# Patient Record
Sex: Female | Born: 1935 | Race: Black or African American | Hispanic: No | Smoking: Former smoker
Health system: Southern US, Community
[De-identification: ages and names within clinical notes are randomized; demographics above are authoritative.]

## PROBLEM LIST (undated history)

## (undated) DIAGNOSIS — I10 Essential (primary) hypertension: Secondary | ICD-10-CM

## (undated) DIAGNOSIS — M109 Gout, unspecified: Secondary | ICD-10-CM

## (undated) DIAGNOSIS — D631 Anemia in chronic kidney disease: Secondary | ICD-10-CM

## (undated) DIAGNOSIS — M1990 Unspecified osteoarthritis, unspecified site: Secondary | ICD-10-CM

## (undated) DIAGNOSIS — F1010 Alcohol abuse, uncomplicated: Secondary | ICD-10-CM

## (undated) DIAGNOSIS — N189 Chronic kidney disease, unspecified: Secondary | ICD-10-CM

## (undated) HISTORY — DX: Chronic kidney disease, unspecified: N18.9

## (undated) HISTORY — PX: ABDOMINAL HYSTERECTOMY: SHX81

## (undated) HISTORY — DX: Anemia in chronic kidney disease: D63.1

---

## 2004-12-03 ENCOUNTER — Ambulatory Visit: Payer: Self-pay | Admitting: Internal Medicine

## 2004-12-29 ENCOUNTER — Emergency Department: Payer: Self-pay | Admitting: Emergency Medicine

## 2005-07-03 ENCOUNTER — Ambulatory Visit: Payer: Self-pay | Admitting: *Deleted

## 2005-12-28 ENCOUNTER — Emergency Department: Payer: Self-pay | Admitting: Emergency Medicine

## 2008-11-23 ENCOUNTER — Ambulatory Visit: Payer: Self-pay | Admitting: Family Medicine

## 2009-03-31 ENCOUNTER — Ambulatory Visit: Payer: Self-pay | Admitting: Internal Medicine

## 2009-04-12 ENCOUNTER — Ambulatory Visit: Payer: Self-pay | Admitting: Family Medicine

## 2009-04-20 ENCOUNTER — Ambulatory Visit: Payer: Self-pay | Admitting: Internal Medicine

## 2009-05-01 ENCOUNTER — Ambulatory Visit: Payer: Self-pay | Admitting: Internal Medicine

## 2009-05-19 ENCOUNTER — Inpatient Hospital Stay: Payer: Self-pay | Admitting: Psychiatry

## 2009-05-23 ENCOUNTER — Inpatient Hospital Stay: Payer: Self-pay | Admitting: Internal Medicine

## 2009-05-31 ENCOUNTER — Ambulatory Visit: Payer: Self-pay | Admitting: Internal Medicine

## 2009-06-06 ENCOUNTER — Ambulatory Visit: Payer: Self-pay | Admitting: Internal Medicine

## 2009-07-01 ENCOUNTER — Ambulatory Visit: Payer: Self-pay | Admitting: Internal Medicine

## 2009-08-01 ENCOUNTER — Ambulatory Visit: Payer: Self-pay | Admitting: Internal Medicine

## 2009-08-29 ENCOUNTER — Ambulatory Visit: Payer: Self-pay | Admitting: Internal Medicine

## 2009-09-11 ENCOUNTER — Ambulatory Visit: Payer: Self-pay | Admitting: Internal Medicine

## 2009-09-29 ENCOUNTER — Ambulatory Visit: Payer: Self-pay | Admitting: Internal Medicine

## 2011-07-01 ENCOUNTER — Inpatient Hospital Stay: Payer: Self-pay | Admitting: *Deleted

## 2011-07-02 LAB — BASIC METABOLIC PANEL
Anion Gap: 17 — ABNORMAL HIGH (ref 7–16)
Co2: 21 mmol/L (ref 21–32)
Creatinine: 1.73 mg/dL — ABNORMAL HIGH (ref 0.60–1.30)
EGFR (African American): 37 — ABNORMAL LOW
EGFR (Non-African Amer.): 31 — ABNORMAL LOW
Glucose: 96 mg/dL (ref 65–99)
Osmolality: 291 (ref 275–301)
Sodium: 144 mmol/L (ref 136–145)

## 2011-07-02 LAB — HEPATIC FUNCTION PANEL A (ARMC)
Albumin: 1.9 g/dL — ABNORMAL LOW (ref 3.4–5.0)
Alkaline Phosphatase: 37 U/L — ABNORMAL LOW (ref 50–136)
Bilirubin, Direct: 0.1 mg/dL (ref 0.00–0.20)

## 2011-07-02 LAB — URINALYSIS, COMPLETE
Ph: 6 (ref 4.5–8.0)
Protein: 30
RBC,UR: 22 /HPF (ref 0–5)
Specific Gravity: 1.009 (ref 1.003–1.030)
Squamous Epithelial: 1

## 2011-07-02 LAB — CBC WITH DIFFERENTIAL/PLATELET
Basophil #: 0 10*3/uL (ref 0.0–0.1)
Basophil %: 0 %
Eosinophil %: 1.3 %
HGB: 9.8 g/dL — ABNORMAL LOW (ref 12.0–16.0)
Monocyte #: 0.6 10*3/uL (ref 0.0–0.7)
Monocyte %: 9.9 %
Neutrophil %: 75 %
Platelet: 194 10*3/uL (ref 150–440)
RBC: 3.21 10*6/uL — ABNORMAL LOW (ref 3.80–5.20)
RDW: 19.9 % — ABNORMAL HIGH (ref 11.5–14.5)
WBC: 6.1 10*3/uL (ref 3.6–11.0)

## 2011-07-02 LAB — PROTIME-INR
INR: 1.2
Prothrombin Time: 15.1 secs — ABNORMAL HIGH (ref 11.5–14.7)

## 2011-07-02 LAB — LIPASE, BLOOD: Lipase: 72 U/L — ABNORMAL LOW (ref 73–393)

## 2011-07-03 LAB — PROTEIN / CREATININE RATIO, URINE
Creatinine, Urine: 119.2 mg/dL (ref 30.0–125.0)
Protein, Random Urine: 86 mg/dL — ABNORMAL HIGH (ref 0–12)
Protein/Creat. Ratio: 721 mg/gCREAT — ABNORMAL HIGH (ref 0–200)

## 2011-07-03 LAB — BASIC METABOLIC PANEL
BUN: 21 mg/dL — ABNORMAL HIGH (ref 7–18)
Chloride: 108 mmol/L — ABNORMAL HIGH (ref 98–107)
Creatinine: 1.73 mg/dL — ABNORMAL HIGH (ref 0.60–1.30)
EGFR (Non-African Amer.): 31 — ABNORMAL LOW
Potassium: 4.1 mmol/L (ref 3.5–5.1)
Sodium: 142 mmol/L (ref 136–145)

## 2011-07-03 LAB — URINALYSIS, COMPLETE
Hyaline Cast: 8
Ketone: NEGATIVE
Nitrite: NEGATIVE
Ph: 5 (ref 4.5–8.0)
Protein: 30
RBC,UR: 112 /HPF (ref 0–5)

## 2011-07-03 LAB — HEMOGLOBIN: HGB: 8.9 g/dL — ABNORMAL LOW (ref 12.0–16.0)

## 2011-07-04 LAB — PHOSPHORUS: Phosphorus: 1.5 mg/dL — ABNORMAL LOW (ref 2.5–4.9)

## 2011-07-04 LAB — BASIC METABOLIC PANEL
Anion Gap: 12 (ref 7–16)
BUN: 14 mg/dL (ref 7–18)
Chloride: 110 mmol/L — ABNORMAL HIGH (ref 98–107)
Co2: 17 mmol/L — ABNORMAL LOW (ref 21–32)
Creatinine: 1.62 mg/dL — ABNORMAL HIGH (ref 0.60–1.30)
EGFR (African American): 40 — ABNORMAL LOW
Glucose: 96 mg/dL (ref 65–99)
Osmolality: 278 (ref 275–301)
Potassium: 4.6 mmol/L (ref 3.5–5.1)

## 2011-07-04 LAB — URIC ACID: Uric Acid: 10.2 mg/dL — ABNORMAL HIGH (ref 2.6–6.0)

## 2011-07-05 LAB — URINE CULTURE

## 2011-07-05 LAB — PHOSPHORUS: Phosphorus: 2.1 mg/dL — ABNORMAL LOW (ref 2.5–4.9)

## 2011-07-06 LAB — BASIC METABOLIC PANEL
Anion Gap: 10 (ref 7–16)
BUN: 10 mg/dL (ref 7–18)
Calcium, Total: 6.8 mg/dL — CL (ref 8.5–10.1)
Co2: 22 mmol/L (ref 21–32)
EGFR (Non-African Amer.): 39 — ABNORMAL LOW
Glucose: 93 mg/dL (ref 65–99)
Osmolality: 282 (ref 275–301)
Potassium: 4.1 mmol/L (ref 3.5–5.1)

## 2011-07-06 LAB — PROTEIN / CREATININE RATIO, URINE
Creatinine, Urine: 104.3 mg/dL (ref 30.0–125.0)
Protein, Random Urine: 78 mg/dL — ABNORMAL HIGH (ref 0–12)
Protein/Creat. Ratio: 748 mg/gCREAT — ABNORMAL HIGH (ref 0–200)

## 2011-07-06 LAB — CBC WITH DIFFERENTIAL/PLATELET
Basophil %: 0.1 %
Eosinophil #: 0.4 10*3/uL (ref 0.0–0.7)
Eosinophil %: 5 %
HGB: 9.1 g/dL — ABNORMAL LOW (ref 12.0–16.0)
Lymphocyte #: 1.3 10*3/uL (ref 1.0–3.6)
MCH: 30 pg (ref 26.0–34.0)
MCHC: 32.7 g/dL (ref 32.0–36.0)
MCV: 92 fL (ref 80–100)
Monocyte #: 0.8 10*3/uL — ABNORMAL HIGH (ref 0.0–0.7)
Neutrophil #: 4.8 10*3/uL (ref 1.4–6.5)
Neutrophil %: 66.8 %
RBC: 3.03 10*6/uL — ABNORMAL LOW (ref 3.80–5.20)

## 2011-07-07 LAB — BASIC METABOLIC PANEL
Anion Gap: 8 (ref 7–16)
Calcium, Total: 7 mg/dL — CL (ref 8.5–10.1)
Chloride: 106 mmol/L (ref 98–107)
Co2: 22 mmol/L (ref 21–32)
EGFR (African American): 48 — ABNORMAL LOW
EGFR (Non-African Amer.): 40 — ABNORMAL LOW
Osmolality: 270 (ref 275–301)
Potassium: 3.7 mmol/L (ref 3.5–5.1)

## 2011-07-08 LAB — BASIC METABOLIC PANEL
BUN: 10 mg/dL (ref 7–18)
Calcium, Total: 7.1 mg/dL — ABNORMAL LOW (ref 8.5–10.1)
Chloride: 106 mmol/L (ref 98–107)
Creatinine: 1.32 mg/dL — ABNORMAL HIGH (ref 0.60–1.30)
EGFR (African American): 51 — ABNORMAL LOW
EGFR (Non-African Amer.): 42 — ABNORMAL LOW
Potassium: 4 mmol/L (ref 3.5–5.1)

## 2011-07-08 LAB — PROTEIN ELECTROPHORESIS(ARMC)

## 2011-07-08 LAB — HEMOGLOBIN: HGB: 9.4 g/dL — ABNORMAL LOW (ref 12.0–16.0)

## 2011-09-12 ENCOUNTER — Inpatient Hospital Stay: Payer: Self-pay | Admitting: Specialist

## 2011-09-12 LAB — COMPREHENSIVE METABOLIC PANEL
Albumin: 2.4 g/dL — ABNORMAL LOW (ref 3.4–5.0)
BUN: 18 mg/dL (ref 7–18)
Bilirubin,Total: 0.3 mg/dL (ref 0.2–1.0)
Calcium, Total: 7.5 mg/dL — ABNORMAL LOW (ref 8.5–10.1)
EGFR (African American): 39 — ABNORMAL LOW
Glucose: 77 mg/dL (ref 65–99)
SGOT(AST): 40 U/L — ABNORMAL HIGH (ref 15–37)
SGPT (ALT): 22 U/L
Sodium: 142 mmol/L (ref 136–145)
Total Protein: 6.9 g/dL (ref 6.4–8.2)

## 2011-09-12 LAB — CBC
HCT: 21.1 % — ABNORMAL LOW (ref 35.0–47.0)
MCH: 32.2 pg (ref 26.0–34.0)
MCV: 96 fL (ref 80–100)
RBC: 2.2 10*6/uL — ABNORMAL LOW (ref 3.80–5.20)
RDW: 20.7 % — ABNORMAL HIGH (ref 11.5–14.5)
WBC: 7.6 10*3/uL (ref 3.6–11.0)

## 2011-09-13 LAB — CBC WITH DIFFERENTIAL/PLATELET
Basophil #: 0 10*3/uL (ref 0.0–0.1)
Basophil %: 0.2 %
Eosinophil #: 0.3 10*3/uL (ref 0.0–0.7)
Eosinophil %: 3.9 %
Lymphocyte #: 1.8 10*3/uL (ref 1.0–3.6)
Lymphocyte %: 23.3 %
MCHC: 33.2 g/dL (ref 32.0–36.0)
Monocyte #: 0.8 10*3/uL — ABNORMAL HIGH (ref 0.0–0.7)
Neutrophil %: 62.5 %
Platelet: 183 10*3/uL (ref 150–440)
RDW: 20.3 % — ABNORMAL HIGH (ref 11.5–14.5)
WBC: 7.9 10*3/uL (ref 3.6–11.0)

## 2011-09-13 LAB — BASIC METABOLIC PANEL
Chloride: 104 mmol/L (ref 98–107)
Co2: 26 mmol/L (ref 21–32)
EGFR (Non-African Amer.): 31 — ABNORMAL LOW
Osmolality: 288 (ref 275–301)
Potassium: 3.7 mmol/L (ref 3.5–5.1)

## 2011-09-14 LAB — CBC WITH DIFFERENTIAL/PLATELET
Basophil #: 0 10*3/uL (ref 0.0–0.1)
Basophil %: 0.1 %
Eosinophil %: 2.8 %
Lymphocyte #: 2.4 10*3/uL (ref 1.0–3.6)
Lymphocyte %: 28.2 %
MCV: 93 fL (ref 80–100)
Monocyte %: 11 %
Platelet: 192 10*3/uL (ref 150–440)
RBC: 3.46 10*6/uL — ABNORMAL LOW (ref 3.80–5.20)
WBC: 8.6 10*3/uL (ref 3.6–11.0)

## 2011-09-14 LAB — BASIC METABOLIC PANEL
Anion Gap: 13 (ref 7–16)
Calcium, Total: 7.4 mg/dL — ABNORMAL LOW (ref 8.5–10.1)
Co2: 24 mmol/L (ref 21–32)
EGFR (African American): 36 — ABNORMAL LOW
EGFR (Non-African Amer.): 30 — ABNORMAL LOW
Glucose: 78 mg/dL (ref 65–99)
Osmolality: 278 (ref 275–301)
Potassium: 4 mmol/L (ref 3.5–5.1)
Sodium: 139 mmol/L (ref 136–145)

## 2012-03-03 ENCOUNTER — Inpatient Hospital Stay: Payer: Self-pay | Admitting: Internal Medicine

## 2012-03-03 LAB — BASIC METABOLIC PANEL
BUN: 26 mg/dL — ABNORMAL HIGH (ref 7–18)
Calcium, Total: 8 mg/dL — ABNORMAL LOW (ref 8.5–10.1)
Chloride: 97 mmol/L — ABNORMAL LOW (ref 98–107)
Co2: 14 mmol/L — ABNORMAL LOW (ref 21–32)
Creatinine: 2.01 mg/dL — ABNORMAL HIGH (ref 0.60–1.30)
EGFR (African American): 27 — ABNORMAL LOW
Potassium: 3.9 mmol/L (ref 3.5–5.1)
Sodium: 133 mmol/L — ABNORMAL LOW (ref 136–145)

## 2012-03-03 LAB — CBC WITH DIFFERENTIAL/PLATELET
Basophil #: 0 10*3/uL (ref 0.0–0.1)
HCT: 32.1 % — ABNORMAL LOW (ref 35.0–47.0)
Lymphocyte #: 0.4 10*3/uL — ABNORMAL LOW (ref 1.0–3.6)
MCH: 31.7 pg (ref 26.0–34.0)
MCHC: 32.4 g/dL (ref 32.0–36.0)
MCV: 98 fL (ref 80–100)
Monocyte #: 0.1 x10 3/mm — ABNORMAL LOW (ref 0.2–0.9)
Monocyte %: 2 %
Neutrophil #: 6.2 10*3/uL (ref 1.4–6.5)
Platelet: 201 10*3/uL (ref 150–440)
RDW: 16.2 % — ABNORMAL HIGH (ref 11.5–14.5)
WBC: 6.8 10*3/uL (ref 3.6–11.0)

## 2012-03-03 LAB — PHOSPHORUS: Phosphorus: 5.7 mg/dL — ABNORMAL HIGH (ref 2.5–4.9)

## 2012-03-03 LAB — DRUG SCREEN, URINE
Barbiturates, Ur Screen: NEGATIVE (ref ?–200)
Cannabinoid 50 Ng, Ur ~~LOC~~: NEGATIVE (ref ?–50)
Cocaine Metabolite,Ur ~~LOC~~: NEGATIVE (ref ?–300)
Methadone, Ur Screen: NEGATIVE (ref ?–300)
Opiate, Ur Screen: NEGATIVE (ref ?–300)
Phencyclidine (PCP) Ur S: NEGATIVE (ref ?–25)
Tricyclic, Ur Screen: NEGATIVE (ref ?–1000)

## 2012-03-03 LAB — COMPREHENSIVE METABOLIC PANEL
Albumin: 3.3 g/dL — ABNORMAL LOW (ref 3.4–5.0)
Alkaline Phosphatase: 75 U/L (ref 50–136)
Anion Gap: 25 — ABNORMAL HIGH (ref 7–16)
BUN: 26 mg/dL — ABNORMAL HIGH (ref 7–18)
Calcium, Total: 8.5 mg/dL (ref 8.5–10.1)
Creatinine: 2.03 mg/dL — ABNORMAL HIGH (ref 0.60–1.30)
EGFR (Non-African Amer.): 23 — ABNORMAL LOW
Glucose: 35 mg/dL — CL (ref 65–99)
Osmolality: 273 (ref 275–301)
Potassium: 4 mmol/L (ref 3.5–5.1)
SGOT(AST): 36 U/L (ref 15–37)
SGPT (ALT): 13 U/L (ref 12–78)
Sodium: 136 mmol/L (ref 136–145)
Total Protein: 8.3 g/dL — ABNORMAL HIGH (ref 6.4–8.2)

## 2012-03-03 LAB — URINALYSIS, COMPLETE
Bilirubin,UR: NEGATIVE
Glucose,UR: NEGATIVE mg/dL (ref 0–75)
Hyaline Cast: 7
Nitrite: NEGATIVE
Ph: 5 (ref 4.5–8.0)
Protein: 100
RBC,UR: 23 /HPF (ref 0–5)
Specific Gravity: 1.01 (ref 1.003–1.030)
Squamous Epithelial: 2
WBC UR: 472 /HPF (ref 0–5)

## 2012-03-03 LAB — ETHANOL: Ethanol %: 0.105 % — ABNORMAL HIGH (ref 0.000–0.080)

## 2012-03-03 LAB — TSH: Thyroid Stimulating Horm: 1.66 u[IU]/mL

## 2012-03-03 LAB — LIPASE, BLOOD: Lipase: 390 U/L (ref 73–393)

## 2012-03-04 LAB — BASIC METABOLIC PANEL
BUN: 26 mg/dL — ABNORMAL HIGH (ref 7–18)
Calcium, Total: 7.5 mg/dL — ABNORMAL LOW (ref 8.5–10.1)
Chloride: 100 mmol/L (ref 98–107)
Co2: 23 mmol/L (ref 21–32)
Osmolality: 272 (ref 275–301)
Potassium: 3.7 mmol/L (ref 3.5–5.1)
Sodium: 134 mmol/L — ABNORMAL LOW (ref 136–145)

## 2012-03-04 LAB — PHOSPHORUS: Phosphorus: 3 mg/dL (ref 2.5–4.9)

## 2012-03-05 LAB — BASIC METABOLIC PANEL
BUN: 22 mg/dL — ABNORMAL HIGH (ref 7–18)
Calcium, Total: 7.1 mg/dL — ABNORMAL LOW (ref 8.5–10.1)
Chloride: 106 mmol/L (ref 98–107)
Creatinine: 1.82 mg/dL — ABNORMAL HIGH (ref 0.60–1.30)
EGFR (Non-African Amer.): 27 — ABNORMAL LOW
Glucose: 95 mg/dL (ref 65–99)
Osmolality: 277 (ref 275–301)
Potassium: 3.2 mmol/L — ABNORMAL LOW (ref 3.5–5.1)
Sodium: 137 mmol/L (ref 136–145)

## 2012-03-05 LAB — CBC WITH DIFFERENTIAL/PLATELET
Basophil #: 0 10*3/uL (ref 0.0–0.1)
Basophil %: 0.5 %
Eosinophil #: 0.2 10*3/uL (ref 0.0–0.7)
HCT: 22.8 % — ABNORMAL LOW (ref 35.0–47.0)
Lymphocyte #: 2.3 10*3/uL (ref 1.0–3.6)
Lymphocyte %: 40.8 %
MCH: 31.9 pg (ref 26.0–34.0)
MCHC: 33.6 g/dL (ref 32.0–36.0)
Neutrophil #: 2.5 10*3/uL (ref 1.4–6.5)
Neutrophil %: 46 %
Platelet: 134 10*3/uL — ABNORMAL LOW (ref 150–440)
RDW: 16 % — ABNORMAL HIGH (ref 11.5–14.5)
WBC: 5.5 10*3/uL (ref 3.6–11.0)

## 2012-03-06 LAB — CBC WITH DIFFERENTIAL/PLATELET
HCT: 24.2 % — ABNORMAL LOW (ref 35.0–47.0)
Lymphocyte #: 1.1 10*3/uL (ref 1.0–3.6)
MCH: 31.2 pg (ref 26.0–34.0)
MCHC: 32.8 g/dL (ref 32.0–36.0)
MCV: 95 fL (ref 80–100)
Monocyte #: 0.5 x10 3/mm (ref 0.2–0.9)
Monocyte %: 10.5 %
Neutrophil #: 2.7 10*3/uL (ref 1.4–6.5)
Neutrophil %: 59.1 %
Platelet: 131 10*3/uL — ABNORMAL LOW (ref 150–440)
RDW: 16 % — ABNORMAL HIGH (ref 11.5–14.5)
WBC: 4.6 10*3/uL (ref 3.6–11.0)

## 2012-03-06 LAB — BASIC METABOLIC PANEL
Anion Gap: 7 (ref 7–16)
BUN: 11 mg/dL (ref 7–18)
Calcium, Total: 7.2 mg/dL — ABNORMAL LOW (ref 8.5–10.1)
Chloride: 110 mmol/L — ABNORMAL HIGH (ref 98–107)
EGFR (Non-African Amer.): 32 — ABNORMAL LOW
Glucose: 96 mg/dL (ref 65–99)
Potassium: 3.7 mmol/L (ref 3.5–5.1)
Sodium: 141 mmol/L (ref 136–145)

## 2012-05-16 ENCOUNTER — Other Ambulatory Visit: Payer: Self-pay | Admitting: Family Medicine

## 2012-05-16 LAB — URINALYSIS, COMPLETE
Bilirubin,UR: NEGATIVE
Glucose,UR: NEGATIVE mg/dL (ref 0–75)
Nitrite: NEGATIVE
Protein: 100
RBC,UR: 24 /HPF (ref 0–5)
Specific Gravity: 1.011 (ref 1.003–1.030)
Squamous Epithelial: 2

## 2012-07-09 ENCOUNTER — Emergency Department: Payer: Self-pay | Admitting: Emergency Medicine

## 2012-07-09 LAB — CBC WITH DIFFERENTIAL/PLATELET
Basophil #: 0.1 10*3/uL (ref 0.0–0.1)
Basophil %: 1.3 %
Eosinophil %: 3 %
HGB: 8.7 g/dL — ABNORMAL LOW (ref 12.0–16.0)
Lymphocyte %: 37.7 %
MCHC: 33.5 g/dL (ref 32.0–36.0)
MCV: 93 fL (ref 80–100)
Monocyte %: 8.9 %
Neutrophil #: 2.3 10*3/uL (ref 1.4–6.5)
Platelet: 156 10*3/uL (ref 150–440)
RDW: 15.8 % — ABNORMAL HIGH (ref 11.5–14.5)
WBC: 4.6 10*3/uL (ref 3.6–11.0)

## 2012-07-09 LAB — COMPREHENSIVE METABOLIC PANEL
Anion Gap: 10 (ref 7–16)
Calcium, Total: 7.7 mg/dL — ABNORMAL LOW (ref 8.5–10.1)
Creatinine: 1.22 mg/dL (ref 0.60–1.30)
EGFR (African American): 50 — ABNORMAL LOW
EGFR (Non-African Amer.): 43 — ABNORMAL LOW
Sodium: 142 mmol/L (ref 136–145)
Total Protein: 6.8 g/dL (ref 6.4–8.2)

## 2012-07-09 LAB — ETHANOL
Ethanol %: 0.238 % — ABNORMAL HIGH (ref 0.000–0.080)
Ethanol: 238 mg/dL

## 2012-07-09 LAB — CK TOTAL AND CKMB (NOT AT ARMC): CK-MB: <0.5 ng/mL — ABNORMAL LOW (ref 0.5–3.6)

## 2012-09-09 ENCOUNTER — Emergency Department: Payer: Self-pay | Admitting: Emergency Medicine

## 2012-09-09 LAB — COMPREHENSIVE METABOLIC PANEL
Anion Gap: 17 — ABNORMAL HIGH (ref 7–16)
BUN: 10 mg/dL (ref 7–18)
Calcium, Total: 8.1 mg/dL — ABNORMAL LOW (ref 8.5–10.1)
Chloride: 103 mmol/L (ref 98–107)
Co2: 18 mmol/L — ABNORMAL LOW (ref 21–32)
Creatinine: 1.39 mg/dL — ABNORMAL HIGH (ref 0.60–1.30)
EGFR (African American): 43 — ABNORMAL LOW
Potassium: 4.1 mmol/L (ref 3.5–5.1)
SGPT (ALT): 35 U/L (ref 12–78)
Total Protein: 6.6 g/dL (ref 6.4–8.2)

## 2012-09-09 LAB — URINALYSIS, COMPLETE
Bilirubin,UR: NEGATIVE
Nitrite: NEGATIVE
Ph: 5 (ref 4.5–8.0)
Protein: NEGATIVE
RBC,UR: 2 /HPF (ref 0–5)
Squamous Epithelial: 2

## 2012-09-09 LAB — ETHANOL
Ethanol %: 0.059 % (ref 0.000–0.080)
Ethanol: 59 mg/dL

## 2012-09-09 LAB — CBC
MCV: 97 fL (ref 80–100)
WBC: 5.9 10*3/uL (ref 3.6–11.0)

## 2012-09-29 ENCOUNTER — Inpatient Hospital Stay: Payer: Self-pay | Admitting: Internal Medicine

## 2012-09-29 LAB — COMPREHENSIVE METABOLIC PANEL
BUN: 15 mg/dL (ref 7–18)
Bilirubin,Total: 1 mg/dL (ref 0.2–1.0)
Creatinine: 2.12 mg/dL — ABNORMAL HIGH (ref 0.60–1.30)
EGFR (Non-African Amer.): 22 — ABNORMAL LOW
Glucose: 112 mg/dL — ABNORMAL HIGH (ref 65–99)
SGOT(AST): 98 U/L — ABNORMAL HIGH (ref 15–37)
Sodium: 139 mmol/L (ref 136–145)
Total Protein: 6.6 g/dL (ref 6.4–8.2)

## 2012-09-29 LAB — URINALYSIS, COMPLETE
Nitrite: NEGATIVE
Protein: 30
Squamous Epithelial: 1
WBC UR: 41 /HPF (ref 0–5)

## 2012-09-29 LAB — CBC
HCT: 21.7 % — ABNORMAL LOW (ref 35.0–47.0)
HGB: 6.8 g/dL — ABNORMAL LOW (ref 12.0–16.0)
MCH: 31.7 pg (ref 26.0–34.0)
MCHC: 31.2 g/dL — ABNORMAL LOW (ref 32.0–36.0)
MCV: 102 fL — ABNORMAL HIGH (ref 80–100)
Platelet: 164 10*3/uL (ref 150–440)
RDW: 18.2 % — ABNORMAL HIGH (ref 11.5–14.5)
WBC: 4.8 10*3/uL (ref 3.6–11.0)

## 2012-09-29 LAB — DRUG SCREEN, URINE
Amphetamines, Ur Screen: NEGATIVE (ref ?–1000)
Barbiturates, Ur Screen: NEGATIVE (ref ?–200)
Benzodiazepine, Ur Scrn: NEGATIVE (ref ?–200)
Cannabinoid 50 Ng, Ur ~~LOC~~: NEGATIVE (ref ?–50)
Cocaine Metabolite,Ur ~~LOC~~: NEGATIVE (ref ?–300)
Opiate, Ur Screen: NEGATIVE (ref ?–300)

## 2012-09-29 LAB — TSH: Thyroid Stimulating Horm: 5.9 u[IU]/mL — ABNORMAL HIGH

## 2012-09-29 LAB — ETHANOL: Ethanol: <3 mg/dL

## 2012-09-30 LAB — CBC WITH DIFFERENTIAL/PLATELET
Basophil #: 0.1 10*3/uL (ref 0.0–0.1)
Eosinophil %: 2.7 %
HCT: 26.5 % — ABNORMAL LOW (ref 35.0–47.0)
HGB: 8.5 g/dL — ABNORMAL LOW (ref 12.0–16.0)
Lymphocyte #: 1.5 10*3/uL (ref 1.0–3.6)
MCH: 30.4 pg (ref 26.0–34.0)
MCHC: 32.1 g/dL (ref 32.0–36.0)
MCV: 95 fL (ref 80–100)
Monocyte #: 0.4 x10 3/mm (ref 0.2–0.9)
Monocyte %: 6.9 %
Neutrophil %: 60.9 %
Platelet: 160 10*3/uL (ref 150–440)
WBC: 5.3 10*3/uL (ref 3.6–11.0)

## 2012-09-30 LAB — COMPREHENSIVE METABOLIC PANEL
Albumin: 1.9 g/dL — ABNORMAL LOW (ref 3.4–5.0)
Alkaline Phosphatase: 96 U/L (ref 50–136)
BUN: 14 mg/dL (ref 7–18)
Calcium, Total: 7.9 mg/dL — ABNORMAL LOW (ref 8.5–10.1)
Chloride: 105 mmol/L (ref 98–107)
Creatinine: 1.88 mg/dL — ABNORMAL HIGH (ref 0.60–1.30)
Osmolality: 278 (ref 275–301)
Potassium: 3.6 mmol/L (ref 3.5–5.1)
Sodium: 139 mmol/L (ref 136–145)
Total Protein: 6.6 g/dL (ref 6.4–8.2)

## 2012-09-30 LAB — OCCULT BLOOD X 1 CARD TO LAB, STOOL: Occult Blood, Feces: NEGATIVE

## 2012-09-30 LAB — MAGNESIUM: Magnesium: 2 mg/dL

## 2012-09-30 LAB — LIPID PANEL
Cholesterol: 120 mg/dL (ref 0–200)
Triglycerides: 172 mg/dL (ref 0–200)

## 2012-10-01 LAB — BASIC METABOLIC PANEL
Anion Gap: 8 (ref 7–16)
BUN: 13 mg/dL (ref 7–18)
Chloride: 112 mmol/L — ABNORMAL HIGH (ref 98–107)
Creatinine: 1.61 mg/dL — ABNORMAL HIGH (ref 0.60–1.30)
EGFR (Non-African Amer.): 31 — ABNORMAL LOW
Osmolality: 283 (ref 275–301)
Potassium: 3.4 mmol/L — ABNORMAL LOW (ref 3.5–5.1)
Sodium: 142 mmol/L (ref 136–145)

## 2012-10-01 LAB — CBC WITH DIFFERENTIAL/PLATELET
Basophil %: 0.9 %
Eosinophil #: 0.1 10*3/uL (ref 0.0–0.7)
Eosinophil %: 3.1 %
Lymphocyte #: 1.1 10*3/uL (ref 1.0–3.6)
MCH: 30.3 pg (ref 26.0–34.0)
MCHC: 31.9 g/dL — ABNORMAL LOW (ref 32.0–36.0)
MCV: 95 fL (ref 80–100)
Monocyte #: 0.3 x10 3/mm (ref 0.2–0.9)
Monocyte %: 7.5 %
Neutrophil #: 2.4 10*3/uL (ref 1.4–6.5)
Neutrophil %: 60.2 %
Platelet: 137 10*3/uL — ABNORMAL LOW (ref 150–440)
RBC: 2.35 10*6/uL — ABNORMAL LOW (ref 3.80–5.20)
RDW: 22.5 % — ABNORMAL HIGH (ref 11.5–14.5)

## 2012-10-01 LAB — URINE CULTURE

## 2012-10-05 LAB — CULTURE, BLOOD (SINGLE)

## 2012-11-19 ENCOUNTER — Ambulatory Visit: Payer: Self-pay | Admitting: Internal Medicine

## 2012-11-20 ENCOUNTER — Ambulatory Visit: Payer: Self-pay | Admitting: Internal Medicine

## 2012-11-20 LAB — CBC CANCER CENTER
Basophil #: 0.1 x10 3/mm (ref 0.0–0.1)
Basophil %: 1.8 %
HCT: 20.4 % — ABNORMAL LOW (ref 35.0–47.0)
Lymphocyte #: 2 x10 3/mm (ref 1.0–3.6)
Lymphocyte %: 37.2 %
MCHC: 32.4 g/dL (ref 32.0–36.0)
MCV: 90 fL (ref 80–100)
Monocyte #: 0.4 x10 3/mm (ref 0.2–0.9)
Monocyte %: 7.7 %
Neutrophil #: 2.8 x10 3/mm (ref 1.4–6.5)
Platelet: 246 x10 3/mm (ref 150–440)
RBC: 2.26 10*6/uL — ABNORMAL LOW (ref 3.80–5.20)

## 2012-11-20 LAB — COMPREHENSIVE METABOLIC PANEL
Albumin: 1.6 g/dL — ABNORMAL LOW (ref 3.4–5.0)
Alkaline Phosphatase: 91 U/L (ref 50–136)
BUN: 15 mg/dL (ref 7–18)
Bilirubin,Total: 0.3 mg/dL (ref 0.2–1.0)
Calcium, Total: 8 mg/dL — ABNORMAL LOW (ref 8.5–10.1)
Creatinine: 1.7 mg/dL — ABNORMAL HIGH (ref 0.60–1.30)
EGFR (African American): 34 — ABNORMAL LOW
EGFR (Non-African Amer.): 29 — ABNORMAL LOW
Glucose: 82 mg/dL (ref 65–99)
Osmolality: 281 (ref 275–301)
Potassium: 3.4 mmol/L — ABNORMAL LOW (ref 3.5–5.1)
SGOT(AST): 27 U/L (ref 15–37)
SGPT (ALT): 20 U/L (ref 12–78)
Sodium: 141 mmol/L (ref 136–145)
Total Protein: 6.2 g/dL — ABNORMAL LOW (ref 6.4–8.2)

## 2012-11-20 LAB — FOLATE: Folic Acid: 39.8 ng/mL (ref 3.1–100.0)

## 2012-11-20 LAB — RETICULOCYTES: Absolute Retic Count: 0.0186 10*6/uL — ABNORMAL LOW

## 2012-11-20 LAB — IRON AND TIBC
Iron Bind.Cap.(Total): 67 ug/dL — ABNORMAL LOW (ref 250–450)
Iron Saturation: 100 %
Iron: 67 ug/dL (ref 50–170)

## 2012-11-20 LAB — MAGNESIUM: Magnesium: 1.2 mg/dL — ABNORMAL LOW

## 2012-11-27 LAB — MAGNESIUM: Magnesium: 1.3 mg/dL — ABNORMAL LOW

## 2012-11-29 ENCOUNTER — Ambulatory Visit: Payer: Self-pay | Admitting: Internal Medicine

## 2012-12-01 LAB — PROT IMMUNOELECTROPHORES(ARMC)

## 2012-12-10 LAB — MAGNESIUM: Magnesium: 1.4 mg/dL — ABNORMAL LOW

## 2012-12-22 LAB — IRON AND TIBC
Iron Bind.Cap.(Total): 103 ug/dL — ABNORMAL LOW (ref 250–450)
Iron Saturation: 78 %

## 2012-12-22 LAB — HEMOGLOBIN: HGB: 10 g/dL — ABNORMAL LOW (ref 12.0–16.0)

## 2012-12-29 ENCOUNTER — Ambulatory Visit: Payer: Self-pay | Admitting: Internal Medicine

## 2013-01-29 ENCOUNTER — Ambulatory Visit: Payer: Self-pay | Admitting: Internal Medicine

## 2013-02-01 LAB — POTASSIUM: Potassium: 3.4 mmol/L — ABNORMAL LOW (ref 3.5–5.1)

## 2013-02-01 LAB — IRON AND TIBC: Iron: 42 ug/dL — ABNORMAL LOW (ref 50–170)

## 2013-02-15 LAB — CANCER CENTER HEMOGLOBIN: HGB: 9.4 g/dL — ABNORMAL LOW (ref 12.0–16.0)

## 2013-03-01 ENCOUNTER — Ambulatory Visit: Payer: Self-pay | Admitting: Internal Medicine

## 2013-03-29 LAB — CANCER CENTER HEMOGLOBIN: HGB: 10.8 g/dL — ABNORMAL LOW (ref 12.0–16.0)

## 2013-03-31 ENCOUNTER — Ambulatory Visit: Payer: Self-pay | Admitting: Internal Medicine

## 2013-05-05 ENCOUNTER — Ambulatory Visit: Payer: Self-pay | Admitting: Internal Medicine

## 2013-05-05 LAB — CANCER CENTER HEMOGLOBIN: HGB: 10.9 g/dL — ABNORMAL LOW (ref 12.0–16.0)

## 2013-05-31 ENCOUNTER — Ambulatory Visit: Payer: Self-pay | Admitting: Internal Medicine

## 2013-10-06 ENCOUNTER — Emergency Department: Payer: Self-pay | Admitting: Emergency Medicine

## 2013-12-20 ENCOUNTER — Emergency Department: Payer: Self-pay | Admitting: Emergency Medicine

## 2013-12-20 LAB — BASIC METABOLIC PANEL
Anion Gap: 11 (ref 7–16)
BUN: 26 mg/dL — AB (ref 7–18)
CREATININE: 1.79 mg/dL — AB (ref 0.60–1.30)
Calcium, Total: 9.2 mg/dL (ref 8.5–10.1)
Chloride: 99 mmol/L (ref 98–107)
Co2: 24 mmol/L (ref 21–32)
EGFR (African American): 31 — ABNORMAL LOW
EGFR (Non-African Amer.): 27 — ABNORMAL LOW
Glucose: 127 mg/dL — ABNORMAL HIGH (ref 65–99)
Osmolality: 275 (ref 275–301)
POTASSIUM: 3.8 mmol/L (ref 3.5–5.1)
Sodium: 134 mmol/L — ABNORMAL LOW (ref 136–145)

## 2013-12-20 LAB — CBC WITH DIFFERENTIAL/PLATELET
Basophil #: 0.1 10*3/uL (ref 0.0–0.1)
Basophil %: 1.1 %
Eosinophil #: 0.5 10*3/uL (ref 0.0–0.7)
Eosinophil %: 8.2 %
HCT: 31.1 % — ABNORMAL LOW (ref 35.0–47.0)
HGB: 9.9 g/dL — ABNORMAL LOW (ref 12.0–16.0)
LYMPHS ABS: 2.1 10*3/uL (ref 1.0–3.6)
Lymphocyte %: 34.4 %
MCH: 31.8 pg (ref 26.0–34.0)
MCHC: 31.7 g/dL — AB (ref 32.0–36.0)
MCV: 100 fL (ref 80–100)
MONO ABS: 0.6 x10 3/mm (ref 0.2–0.9)
MONOS PCT: 10.7 %
Neutrophil #: 2.8 10*3/uL (ref 1.4–6.5)
Neutrophil %: 45.6 %
Platelet: 246 10*3/uL (ref 150–440)
RBC: 3.11 10*6/uL — ABNORMAL LOW (ref 3.80–5.20)
RDW: 17.9 % — AB (ref 11.5–14.5)
WBC: 6.1 10*3/uL (ref 3.6–11.0)

## 2014-04-07 ENCOUNTER — Emergency Department: Payer: Self-pay | Admitting: Emergency Medicine

## 2014-04-07 LAB — CBC WITH DIFFERENTIAL/PLATELET
BASOS PCT: 0.9 %
Basophil #: 0.1 10*3/uL (ref 0.0–0.1)
Eosinophil #: 0 10*3/uL (ref 0.0–0.7)
Eosinophil %: 0.5 %
HCT: 31.1 % — AB (ref 35.0–47.0)
HGB: 10 g/dL — ABNORMAL LOW (ref 12.0–16.0)
LYMPHS PCT: 11.4 %
Lymphocyte #: 0.8 10*3/uL — ABNORMAL LOW (ref 1.0–3.6)
MCH: 31.4 pg (ref 26.0–34.0)
MCHC: 32.3 g/dL (ref 32.0–36.0)
MCV: 97 fL (ref 80–100)
MONOS PCT: 5.1 %
Monocyte #: 0.3 x10 3/mm (ref 0.2–0.9)
NEUTROS ABS: 5.6 10*3/uL (ref 1.4–6.5)
Neutrophil %: 82.1 %
PLATELETS: 224 10*3/uL (ref 150–440)
RBC: 3.2 10*6/uL — AB (ref 3.80–5.20)
RDW: 17.9 % — ABNORMAL HIGH (ref 11.5–14.5)
WBC: 6.8 10*3/uL (ref 3.6–11.0)

## 2014-04-07 LAB — URINALYSIS, COMPLETE
Bilirubin,UR: NEGATIVE
Glucose,UR: NEGATIVE mg/dL (ref 0–75)
Nitrite: NEGATIVE
Ph: 7 (ref 4.5–8.0)
RBC,UR: 14 /HPF (ref 0–5)
SPECIFIC GRAVITY: 1.013 (ref 1.003–1.030)

## 2014-04-07 LAB — PROTIME-INR
INR: 1
Prothrombin Time: 12.9 secs (ref 11.5–14.7)

## 2014-04-07 LAB — COMPREHENSIVE METABOLIC PANEL
ALBUMIN: 2.9 g/dL — AB (ref 3.4–5.0)
ALK PHOS: 74 U/L
ALT: 29 U/L
ANION GAP: 10 (ref 7–16)
AST: 52 U/L — AB (ref 15–37)
BUN: 24 mg/dL — ABNORMAL HIGH (ref 7–18)
Bilirubin,Total: 0.7 mg/dL (ref 0.2–1.0)
CALCIUM: 8.6 mg/dL (ref 8.5–10.1)
CHLORIDE: 105 mmol/L (ref 98–107)
Co2: 22 mmol/L (ref 21–32)
Creatinine: 1.72 mg/dL — ABNORMAL HIGH (ref 0.60–1.30)
EGFR (African American): 37 — ABNORMAL LOW
EGFR (Non-African Amer.): 31 — ABNORMAL LOW
Glucose: 97 mg/dL (ref 65–99)
OSMOLALITY: 278 (ref 275–301)
Potassium: 4.6 mmol/L (ref 3.5–5.1)
Sodium: 137 mmol/L (ref 136–145)
TOTAL PROTEIN: 7.6 g/dL (ref 6.4–8.2)

## 2014-04-07 LAB — LIPASE, BLOOD: Lipase: 122 U/L (ref 73–393)

## 2014-04-07 LAB — CK TOTAL AND CKMB (NOT AT ARMC)
CK, TOTAL: 397 U/L — AB
CK-MB: 2 ng/mL (ref 0.5–3.6)

## 2014-04-07 LAB — TROPONIN I: Troponin-I: <0.02 ng/mL

## 2014-06-06 ENCOUNTER — Emergency Department: Payer: Self-pay | Admitting: Student

## 2014-06-06 LAB — COMPREHENSIVE METABOLIC PANEL
ALBUMIN: 2.4 g/dL — AB (ref 3.4–5.0)
ALT: 25 U/L
ANION GAP: 15 (ref 7–16)
Alkaline Phosphatase: 71 U/L
BUN: 22 mg/dL — ABNORMAL HIGH (ref 7–18)
Bilirubin,Total: 0.4 mg/dL (ref 0.2–1.0)
CHLORIDE: 104 mmol/L (ref 98–107)
CO2: 22 mmol/L (ref 21–32)
Calcium, Total: 7.8 mg/dL — ABNORMAL LOW (ref 8.5–10.1)
Creatinine: 1.38 mg/dL — ABNORMAL HIGH (ref 0.60–1.30)
EGFR (Non-African Amer.): 39 — ABNORMAL LOW
GFR CALC AF AMER: 48 — AB
Glucose: 73 mg/dL (ref 65–99)
Osmolality: 283 (ref 275–301)
Potassium: 5.1 mmol/L (ref 3.5–5.1)
SGOT(AST): 53 U/L — ABNORMAL HIGH (ref 15–37)
Sodium: 141 mmol/L (ref 136–145)
TOTAL PROTEIN: 6.9 g/dL (ref 6.4–8.2)

## 2014-06-06 LAB — URINALYSIS, COMPLETE
BILIRUBIN, UR: NEGATIVE
GLUCOSE, UR: NEGATIVE mg/dL (ref 0–75)
KETONE: NEGATIVE
Nitrite: NEGATIVE
Ph: 5 (ref 4.5–8.0)
Protein: NEGATIVE
RBC,UR: 9 /HPF (ref 0–5)
Specific Gravity: 1.009 (ref 1.003–1.030)

## 2014-06-06 LAB — CK-MB: CK-MB: 0.7 ng/mL (ref 0.5–3.6)

## 2014-06-06 LAB — CBC
HCT: 28 % — AB (ref 35.0–47.0)
HGB: 9.1 g/dL — AB (ref 12.0–16.0)
MCH: 31.7 pg (ref 26.0–34.0)
MCHC: 32.3 g/dL (ref 32.0–36.0)
MCV: 98 fL (ref 80–100)
PLATELETS: 225 10*3/uL (ref 150–440)
RBC: 2.86 10*6/uL — AB (ref 3.80–5.20)
RDW: 16.3 % — AB (ref 11.5–14.5)
WBC: 9.3 10*3/uL (ref 3.6–11.0)

## 2014-06-06 LAB — CK: CK, Total: 123 U/L (ref 26–192)

## 2014-06-06 LAB — TROPONIN I: Troponin-I: <0.02 ng/mL

## 2014-06-07 LAB — TROPONIN I: Troponin-I: <0.02 ng/mL

## 2014-06-08 LAB — URINE CULTURE

## 2014-08-09 ENCOUNTER — Emergency Department: Payer: Self-pay | Admitting: Emergency Medicine

## 2014-10-18 NOTE — Discharge Summary (Signed)
PATIENT NAME:  [name] Holmes, [name] Holmes MR#:  [id number] DATE OF BIRTH:  11/04/1934  DATE OF ADMISSION:  03/03/2012 DATE OF DISCHARGE:  03/06/2012  PRIMARY CARE PHYSICIAN: Delight Stare, M.D.   DISCHARGE DIAGNOSES:  1. Severe hypoglycemia.  2. Anion gap metabolic acidosis.  3. Acute renal failure.  4. Alcohol abuse.  5. Chronic anemia.  6. Hypotension secondary to dehydration.  7. Hypokalemia.  8. Hypomagnesemia.   CONSULTANTS: None.   IMAGING STUDIES: CT scan of the abdomen and pelvis without contrast showed some mild duodenitis and extensive diverticulosis. An x-ray of the chest showed no acute cardiopulmonary disease.   ADMITTING HISTORY AND PHYSICAL: Please see detailed history and physical dictated on 03/03/2012. In brief, 80 year old African American female patient with history of alcohol abuse, alcohol dependence, history of peptic ulcer disease who presented to the Emergency Room after her home health nurse found her to have low blood pressure. The patient also had extremely low blood sugars in the 20s to 30s. She was admitted to the hospitalist service with severe hypoglycemia after being found to be in acute renal failure and severely acidotic.   HOSPITAL COURSE: 1. Severe hypoglycemia. The patient was thought to be in severe hypoglycemia secondary to her alcohol intake and decreased appetite. The patient did not have any further episodes of hypoglycemia during the hospital stay. She was on regular diet and doing well.  2. Acute renal failure. The patient had acute renal failure secondary to dehydration which resolved with IV fluids.  3. Severe acidosis. The patient did have anion gap metabolic acidosis with bicarbonate as low as 12. Did not need any bicarbonate drip. This resolved with IV fluids. This was secondary to her excessive alcohol intake and acute renal failure and her bicarbonate is at 24 on the day of discharge.  4. Alcohol abuse. The patient has been counseled extensively to  quit drinking as this time the patient was extremely sick secondary to her excessive alcohol intake. She did not have any withdrawal during the hospital stay being on the CIWA protocol. She was on thiamine, folic acid and IV fluids.  5. The patient did have a drop in hemoglobin during the hospital stay to 7.7 because of IV fluids, but was stable over the 48 hours and is being discharged home in a stable condition.   DISCHARGE CONDITION: On the day of discharge, the patient's blood pressure is 128/82, pulse of 80, saturating 98% on room air. Abdominal examination is benign and is being discharged home in a stable condition.   DISCHARGE MEDICATIONS:  1. Acetaminophen 650 mg orally every six hours as needed for pain.  2. Amlodipine 5 mg oral once a day.  3. Colace 100 mg oral once a day.  4. Calcium with vitamin D 400/1000 mg oral 2 times a day.  5. Folic acid 1 mg oral once a day.  6. Metoprolol tartrate 25 mg oral 2 times a day.  7. Multivitamin 1 tablet orally once a day.  8. Omeprazole 20 mg orally 2 times a day.  9. Oxybutynin 5 mg oral once a day.  10. Thiamine 100 mg oral once a day.  11. Ferrous sulfate 325 mg oral 2 times a day.  12. Vitamin D 3000 international units once a day.   DISCHARGE INSTRUCTIONS:  1. Follow-up with primary care physician within a week.  2. The patient has been set up with home health therapy for physical therapy.  3. She will be on low salt diet with activity  as tolerated with assistance at all times.  4. This plan was discussed with the patient who has verbalized understanding and is okay with the plan. She is to return to the Emergency Room if she has any bleeding or fever.     TIME SPENT ON THE DAY OF DISCHARGE IN COORDINATING DISCHARGE AND THIS DISCHARGE DICTATION: 35 minutes.    ____________________________ Leia Alf Haitham Dolinsky, MD srs:ap D: 03/06/2012 14:42:26 ET T: 03/09/2012 11:05:41 ET JOB#: [id number]  cc: Alveta Heimlich R. Darvin Neighbours, MD, <Dictator> Marguerita Merles, MD Neita Carp MD ELECTRONICALLY SIGNED 03/10/2012 0:04

## 2014-10-18 NOTE — H&P (Signed)
PATIENT NAME:  [name] Holmes, [name] Holmes MR#:  [id number] DATE OF BIRTH:  Dec 30, 1934  DATE OF ADMISSION:  03/03/2012  PRIMARY CARE PHYSICIAN: Delight Stare, MD  CHIEF COMPLAINT: Hypotension and low blood sugar.   HISTORY OF PRESENT ILLNESS: [name] Holmes is a 80 year old African American female with history of alcohol abuse, alcohol dependence, and history of peptic ulcer disease who comes to the Emergency Room after a home health nurse found the patient's vitals to show a low blood pressure. EMTs were called and they checked her blood sugar; it was in the 20s and 30s. The patient received IM glucagon x1 and brought to the Emergency Room. Thereafter, she got dextrose and her current sugars are 133. She denies any symptoms. She reports eating well at home, however, she says she skipped her breakfast today for reason not known. She is currently asymptomatic. Blood pressure is much stabilized after IV fluids and she is being admitted for further evaluation and management. In the Emergency Room, her labs also showed she has severe anion gap metabolic acidosis with anion gap of 25. She also has elevated creatinine. She is being admitted for further evaluation and management. The patient says she went to a party at her daughter's place yesterday and drank some vodka and beer. She denies drinking any other form of alcohol. The patient is being admitted for further evaluation and management.   PAST MEDICAL HISTORY:  1. Alcohol abuse and dependence.  2. Chronic back pain.  3. Chronic anemia.  4. Peptic ulcer disease with gastritis. EGD was done in January 2013.  5. Former tobacco abuse.  6. Vitamin D deficiency.  7. Multiple electrolyte abnormalities in the past.  8. Chronic kidney disease, creatinine baseline is around 1.4.  9. Upper gastrointestinal bleed with erosive gastritis and gastric ulcers.   PAST SURGICAL HISTORY: Hysterectomy for fallopian tube tumor.   FAMILY HISTORY: Mother died of a brain tumor. Father  died of a cerebrovascular accident.   SOCIAL HISTORY: She lives at home by herself. She has home health and an aide who visits her. Her grandson occasionally lives with her. She continues to drink on a regular basis, beer and vodka. She denies any illicit drug use.   ALLERGIES: No known drug allergies.   MEDICATIONS:  1. Tylenol 325 mg 2 tablets every six hours as needed.  2. Amlodipine 5 mg daily.  3. Calcium with vitamin D 1 tablet twice a day. 4. Colace 100 mg at bedtime.  5. Ferrous sulfate 325 mg p.o. twice a day.  6. Folic acid 1 tablet daily.  7. Lasix 20 mg 1/2 tablet as needed for ankle swelling.  8. Metoprolol 25 mg 1 tablet twice a day. 9. Multivitamin p.o. daily.  10. Omeprazole 20 mg 1 tablet twice a day. 11. Oxybutynin 5 mg 1 tablet daily.  12. Potassium chloride 10 mEq p.o. daily.  13. Thiamine 100 mg daily.  14. Vitamin D3 1000 international units daily.   REVIEW OF SYSTEMS: CONSTITUTIONAL: No fever. Positive for fatigue and weakness. EYES: No blurred or double vision. ENT: No tinnitus, ear pain, or hearing loss. RESPIRATORY: No cough, wheeze, or hemoptysis. CARDIOVASCULAR: No chest pain, orthopnea, or edema. GASTROINTESTINAL: No nausea, vomiting, or abdominal pain. GU: No dysuria or hematuria. ENDOCRINE: No polyuria or nocturia. HEMATOLOGY: No anemia or easy bruising. SKIN: No acne or rash. MUSCULOSKELETAL: Positive for arthritis and back pain. NEUROLOGIC: No cerebrovascular accident or transient ischemic attack. PSYCH: No anxiety or depression. All other systems reviewed and negative.  PHYSICAL EXAMINATION:   GENERAL: The patient is awake, alert, and oriented x3, not in acute distress.   VITAL SIGNS: Afebrile, pulse 88, blood pressure 138/66 and saturation 92% on room air.   HEENT: Atraumatic, normocephalic. Pupils are equal, round, and reactive to light and accommodation. Extraocular movements intact. Oral mucosa is moist. Oral hygiene is poor.   NECK: Supple. No  JVD. No carotid bruit.   LUNGS: Clear to auscultation bilaterally. No rales, rhonchi, respiratory distress, or labored breathing.   HEART: Both the heart sounds are normal. Rate and rhythm is regular. PMI is not lateralized. Chest is nontender.   EXTREMITIES: Good pedal pulses. Good femoral pulses. No lower extremity edema.   NEUROLOGIC: Grossly intact cranial nerves II through XII. No motor or sensory deficits.   PSYCH: The patient is awake, alert, and oriented x3.   SKIN: Warm and dry.   LABORATORY, DIAGNOSTIC AND RADIOLOGIC DATA: pH 7.10, pCO2 less than 19, FiO2 21, saturation 99% on room, and lactic acid is 6.4.   CT of the abdomen and pelvis without contrast shows thickening of the wall of the duodenum. Correlate for duodenitis. Mild thickening of the wall of the sigmoid colon which can be consistent with sigmoid colitis. Extensive diverticulosis. Moderately enlarged hiatal hernia.   White count 6.8, hemoglobin and hematocrit 10.4 and 32.1, and platelet count 201. Glucose 35 and repeat glucose after dextrose and glucagon is 133. Creatinine is 2.03. Bicarbonate is 12. Anion gap is 25. Albumin is 3.3. Lipase is 390. TSH is 1.66. Serum ethanol is 0.105. Serum osmolality is 323.   EKG shows normal sinus rhythm, left axis deviation, nonspecific T wave abnormality.   ASSESSMENT: 80 year old [name] Holmes with:  1. Severe hypoglycemia, etiology unclear. Could be poor p.o. intake with history of alcoholism, although the patient says she has been eating okay, but did not eat breakfast. The patient does not appear to be a reliable source. She received IM glucagon since sugars dropped in the 20s and now up to 133 after dextrose. She denies taking other peoples medications. Her pill box is filled by home health RN at home. CT of the abdomen is negative for any mass.  We will admit the patient to the medical floor, start IV fluids. The patient's sugars are stable. We will get her a regular diet to eat.  We will monitor her sugars every one to two hours, give dextrose drip if sugar continues to drop, and we will have Dr. Gabriel Carina see the patient. I have also sent serum quantitative volatiles and ethylene glycol given her history of alcoholism and see if she would have taken some other form of alcohol at the party.  2. Severe anion gap metabolic acidosis with hypotension and dehydration and suspect poor p.o. intake in the setting of alcohol abuse. The patient reports drinking on a daily basis. She denies using any other form for alcohol. We will send volatile quantitative blood. Continue IV fluids and follow chemistry closely.  3. Acute on chronic CKD. Creatinine baseline is around 1.38 and creatinine today is 2.03. We will continue IV fluids.  4. Acute alcohol dependence and alcohol intoxication. Last night she drank vodka and beer, quantity unknown. We will keep the patient on CIWA protocol with IV Ativan. We will resume her home thiamine and multivitamin.  5. Hypotension secondary to dehydration and what appears to be poor p.o. intake. Continue IV fluids. We will hold off on blood pressure medications at this time.  6. History of peptic ulcer  disease, gastritis, and duodenitis. Continue omeprazole.   7. Abnormal CT findings of extensive diverticulosis and mild thickening of the sigmoid colon. The patient's white count is normal. She does not have fever and she does not clinically have any abdominal tenderness. I will hold off on any antibiotics.  8. Deep vein thrombosis prophylaxis with heparin.   Further work-up according to the patient's clinical course. The hospital admission plan was discussed with the patient. The patient's family members were not present in the Emergency Room.   TIME SPENT: 55 minutes.  ____________________________ Hart Rochester Posey Pronto, MD sap:slb D: 03/03/2012 16:49:46 ET T: 03/03/2012 17:14:15 ET JOB#: [id number]  cc: Desera Graffeo A. Posey Pronto, MD, <Dictator> Marguerita Merles, MD Ilda Basset  MD ELECTRONICALLY SIGNED 03/16/2012 22:14

## 2014-10-21 NOTE — H&P (Signed)
PATIENT NAME:  [name] Holmes, [name] Holmes MR#:  [id number] DATE OF BIRTH:  June 06, 1935  DATE OF ADMISSION:  09/29/2012  PRIMARY CARE PHYSICIAN: Dr. Delight Stare.   CHIEF COMPLAINT: Low blood count, feeling weak.   HISTORY OF PRESENT ILLNESS: The patient is a 80 year old female with past medical history of chronic alcoholic abuse, chronic back pain, chronic anemia, peptic ulcer disease with gastritis, EGD was done in January 2013, vitamin D deficiency, multiple electrolyte abnormalities in the past, chronic kidney disease, upper gastrointestinal bleed with erosive gastritis and gastric ulcers in the past. She  is living at home with home visiting nurse, and she is not a good historian currently; but what little history she provides to me, according to that, she says that she was feeling progressively weak and had some burning in her urination. Her home visiting nurse did some blood work, and she found that her hemoglobin is low at 6.8.  In the past, she was told no for blood transfusion whenever her hemoglobin is more than 7 because she is a chronic alcoholic; but she has been feeling progressively worse, and so she came to the Emergency Room for that. She is being admitted for blood transfusion, and there is also complaint of frequency and burning in the urination. She denies any fever. UA is positive.   REVIEW OF SYSTEMS:   CONSTITUTIONAL: No fever, fatigue, but generalized weakness, no weight loss.  EYES: No blurring, double vision or discharge from the eyes.  ENT: No tinnitus, ear pain or hearing loss.  RESPIRATORY: No cough, wheezing or shortness of breath.  CARDIOVASCULAR: No chest pain, orthopnea, edema or arrhythmia.  GASTROINTESTINAL: No nausea, vomiting, diarrhea or abdominal pain. No blood in the stool.  GENITOURINARY: Increased frequency and burning in the urination.  ENDOCRINOLOGY: No heat or cold intolerance.  SKIN: No rashes or lesions on the skin.  MUSCULOSKELETAL: No pain or swelling on the  joints.  NEUROLOGICAL: No numbness, weakness, tremors or vertigo.  PSYCHIATRIC: No anxiety, insomnia or psychiatric disorders.    PAST MEDICAL HISTORY:  1. Alcohol abuse and dependence.  2. Chronic back pain.  3. Chronic anemia.  4. Peptic ulcer disease with gastritis, EGD done in January 2013, upper gastrointestinal bleed with erosive gastritis and gastric ulcers.  5. Former tobacco abuse.  6. Vitamin D deficiency.  7. Multiple electrolyte abnormalities in the past.  8. Chronic kidney disease, baseline creatinine around 1.4.   PAST SURGICAL HISTORY: 1. Hysterectomy. 2. Fallopian tube tumor.    FAMILY HISTORY: Mother died of brain tumor. Father died of cerebrovascular accident.   SOCIAL HISTORY: She  lives at home by herself. She has home health aide who visits her and a visiting nurse.  Grandson occasionally lives with her.  She continues to drink on a regular basis, beer and vodka.  Denies any illicit drug use.   ALLERGIES: None.   HOME MEDICATIONS: Acetaminophen tablet orally every 6 hours, calcium plus vitamin D 400 mg 1 tablet orally 2 times a day, Colace 100 mg capsule 1 tablet orally at bedtime, ferrous sulfate 325 mg tablet 2 times a day, folic acid 1 mg tablet once a day, MiraLax 17 grams powder oral daily, multivitamins oral daily, omeprazole 20 mg capsule oral 2 times a day, oxybutynin 24-hour extended release 5 mg tablet, take once a day, thiamine 100 mg oral tablet once a day, vitamin D3 1000 international units once a day, magnesium oxide 400 mg tablet daily.   PHYSICAL EXAMINATION:  VITAL SIGNS: Temperature 98.2,  pulse rate 96, respirations 20, blood pressure 96/61 and pulse ox 98 on room air.  GENERAL: The patient is alert and oriented but is not a very good historian, keeps on changing her stories and repeating things. She is cooperative with history taking and physical examination, no in any acute distress at this time.  HEENT: Head and neck atraumatic. Conjunctivae  pale. Oral mucosa moist.  NECK: Supple. No JVD.  RESPIRATORY: Bilateral clear and equal air entry.  CARDIOVASCULAR: S1, S2 present, regular.  ABDOMEN: Soft, nontender, bowel sounds present, no organomegaly.  SKIN: No rashes.  EXTREMITIES:  Joints: No swelling or tenderness.  Legs: No edema.  NEUROLOGICAL: Power 5 out of 5 in all 4 limbs, generalized weakness. No tremors.   LABORATORY AND RADIOLOGICAL DATA: Glucose 112, BUN 15, creatinine 2.12, sodium 139, potassium 4.1, chloride 104, CO2 23, calcium 8.4, ethanol level less than 0.003, total protein 6.6, albumin 1.9, bilirubin 1.0, alkaline phosphatase 95, SGOT 95 and SGPT 44. TSH 5.90. Urinalysis: Negative. WBC is 4.8, hemoglobin 6.8, hematocrit 21.7, platelet count 164, MCV 102. Urinalysis:  41 WBCs and 3+ leukocyte esterase, hazy amber color.    ASSESSMENT AND PLAN: A 80 year old female with multiple past medical history and chronic alcohol abuser, presented to the Emergency Room with worsening weakness and urine frequency and burning.   1. Symptomatic anemia: Her hemoglobin is 6.8, which is chronically low around 8 to 9, and now it is worse. We will transfuse 1 unit RBC.  Stool for guaiac and iron oral supplements.  2. Urinary tract infection: We will give Rocephin IV and urine culture.  3. Chronic alcoholism:  We will keep her on CIWA protocol.  Psychiatry may take her for inpatient detox once medically stable as she wishes to stop drinking.  Psychiatric intake nurse already saw the patient in the ER.  4. Acute renal failure: The lowest creatinine we have in our system is 1.22 on 07/09/2012, which is 2.12 right now possibly due to UTI.  We will give her IV fluids and recheck in the morning. 5. Elevated TSH: We will check free T3 and free T4.   TOTAL TIME SPENT: 50 minutes.   CODE STATUS:  FULL CODE.     ____________________________ Ceasar Lund Anselm Jungling, MD vgv:cb D: 09/29/2012 16:47:51 ET T: 09/29/2012 17:15:33  ET JOB#: [id number]  cc: Ceasar Lund. Anselm Jungling, MD, <Dictator> Vaughan Basta MD ELECTRONICALLY SIGNED 10/11/2012 22:09

## 2014-10-21 NOTE — Consult Note (Signed)
PATIENT NAME:  [name] Holmes, [name] Holmes MR#:  [id number] DATE OF BIRTH:  [birth year]  DATE OF CONSULTATION:  10/01/2012  REFERRING PHYSICIAN:  Belia Heman. Verdell Carmine, MD CONSULTING PHYSICIAN:  Cordelia Pen. Gretel Acre, MD  REASON FOR CONSULTATION: Alcohol use.   HISTORY OF PRESENT ILLNESS: The patient is a 80 year old African American female with long history of chronic alcohol use as well as chronic back pain, who currently lives by herself with visiting home nurse, presented due to progressively feeling weak and having some burning in her urination. Her visiting home nurse did find some blood and found that her hemoglobin was low at 6.8. The patient when presenting to the Emergency Room reported to the ED physician that she has been drinking alcohol every day, 4 to 5 vodkas per day and sips on vodka throughout the day. Her niece, who has also been seeing the patient, reported that for the past several months the patient has been sent to the ER for blood transfusions. She is followed by the [facility] but for the last few times, she has not been given transfusion because she continues to drink. When she came to the Emergency Room she was asking for detox, as she was feeling weak and her blood pressure was low. She lives by herself and reported that she has resources who will bring alcohol for her. She reported that she gets mixed drinks. Reported that she does not have any history of withdrawal seizures as well as tremors, shakes. Reported that she has been drinking since she was 80 years old. Stated that she will make a drink for herself and then she will watch television. The patient denied having any more symptoms, anxiety, as well as using any medications to help with her depressive symptoms. The patient reported that she has a lady who  will come to see her and she gets Meals on Wheels. Reported that her niece, Carola Rhine, will be helping her. The patient reported that now she is tired of this living and once she will be  discharged from the hospital, she will go back home and will change her style of living. She reported that she wants to get better now. However, she does not have any history of sobriety in the past. She reported when she was admitted to the hospital last time, that was the only period of time when she stayed sober. She reported that she has not made any effort to go to the rehabilitation program or to take any medications or to seek psychiatric help.   PAST PSYCHIATRIC HISTORY: The patient denied any history of substance use treatment. She reported that she has never seen a psychiatrist and does not take any medications for the same. She denied any history of depression, anxiety. She reported that she is seen by the [facility] for her medical issues.   MEDICAL PROBLEMS: The patient has a history of low hemoglobin and gets blood transfusions whenever she comes to the Emergency Room.   CURRENT HOME MEDICATIONS: Docusate sodium 100 mg at bedtime once a day, women's multiple vitamins with minerals oral tablet, calcium 989 plus D, folic acid 0.4 mg once daily, magnesium oxide 250 mg daily, acetaminophen 500 mg every 6 hours for pain, oxybutynin 5 mg per 24 hours, ferrous sulfate 325 mg daily, amlodipine 5 mg daily, omeprazole 20 mg daily.   ALLERGIES: No known drug allergies.   FAMILY HISTORY: Her mother died of a brain tumor and father passed away due to cardiovascular event.  SOCIAL HISTORY: The patient currently lives by herself. She has a home health aide who visits her and a visiting nurse. Her grandson also takes care of her. She continues to drink on a consistent basis.   REVIEW OF SYSTEMS: CONSTITUTIONAL: The patient denies any fatigue, fever, but has generalized weakness with no weight loss.  EYES: No blurring or double vision.  ENT: No tinnitus, ear pain or hearing loss.  RESPIRATORY: Denies any cough, wheezing or shortness of breath.  CARDIOVASCULAR: Denies any chest pain, orthopnea,  arrhythmias. GASTROINTESTINAL: No nausea, vomiting, diarrhea,  GENITOURINARY: No increased frequency or burning.  ENDOCRINE: No heat or cold intolerance.  SKIN: No rashes or lesions.  MUSCULOSKELETAL: Denies any joint or swelling.  NEUROLOGICAL: No numbness, weakness or vertigo.   VITAL SIGNS: Temperature 98.2, pulse 88, respirations 19, blood pressure 95/66.   LABORATORY DATA: Glucose 92, BUN 13, creatinine 1.61, potassium 3.4, chloride 112, bicarbonate 22, GFR 36. HDL 172, cholesterol 120. Magnesium 2.0. Urine drug screen was negative. WBC 4.0, hemoglobin 7.1, hematocrit 22.3.   MENTAL STATUS EXAMINATION: The patient is a moderately built female who appeared her stated age. She was calm and cooperative. Her mood was fine. Affect was congruent. Thought process was logical, goal-directed. Thought content was nondelusional. She demonstrated poor insight and judgment regarding her alcohol use. She denied having any perceptual disturbances.   DIAGNOSTIC IMPRESSION:  AXIS I:  1.  Alcohol dependence.  2.  Alcohol-induced mood disorder.  AXIS II: None.  AXIS III: Please review the medical history.   TREATMENT PLAN: I discussed with the patient at length about alcohol use and ill effects of the alcohol related to her health. However, she is not interested in seeking any further treatment and reported that after she will be discharged from the hospital, she will go home and stop drinking alcohol on her own. She reported that she has never seen a psychiatrist and is not interested in doing the same. She does not want to take any medications at this time. The patient will be given lorazepam 1 mg p.o. t.i.d. for the next 3 days to help with the withdrawal from alcohol. No new medications will be started at this time. She will be monitored by her primary care physician as an outpatient.   Thank you for allowing me to participate in the care of this patient.   ____________________________ Cordelia Pen.  Gretel Acre, MD usf:jm D: 10/01/2012 15:00:06 ET T: 10/01/2012 15:32:36 ET JOB#: [id number]  cc: Cordelia Pen. Gretel Acre, MD, <Dictator> Jeronimo Norma MD ELECTRONICALLY SIGNED 10/01/2012 16:04

## 2014-10-21 NOTE — Discharge Summary (Signed)
PATIENT NAME:  [name] Holmes, [name] Holmes MR#:  [id number] DATE OF BIRTH:  09-28-1934  DATE OF ADMISSION:  09/29/2012 DATE OF DISCHARGE:  10/02/2012  Discharged to skilled nursing facility.   PRIMARY CARE PROVIDER:  Delight Stare, M.D.   DISCHARGE DIAGNOSES: 1.  Acute on chronic anemia, status post 1 unit blood transfusion.  2.  Alcohol abuse.  3.  Alcohol withdrawal.  4.  Malnutrition.  5.  Hypoalbuminemia.  6.  Acute renal failure.  7.  Hypokalemia.  8.  Dehydration.  9.  Noncompliance.  10.  Escherichia coli urinary tract infection.  11.  Generalized debility.   IMAGING STUDIES DONE:  None.   ADMITTING HISTORY AND PHYSICAL:  Please see detailed H and P dictated by Dr. Anselm Jungling on 09/29/2012.  In brief, a 80 year old female patient with past history of chronic alcohol abuse, chronic pain, anemia requiring outpatient blood transfusions, presented to the Emergency Room complaining of low blood count and feeling weak.  The patient was found to be anemic, initially thought to be a candidate to go to psychiatry unit for alcohol detox after getting a blood transfusion, but was admitted to the hospitalist service for the blood transfusion along with acute renal failure, dehydration.   HOSPITAL COURSE: 1.  Anemia.  The patient had chronic anemia and had prior work-up for the same with no clear source.  Needs outpatient transfusions regularly.  Had 1 unit of blood transfusion.  Hemoglobin is stable.  Stool occult was checked which was negative and patient is stable to be discharged home.  2.  Acute renal failure.  This was secondary to severe dehydration, alcohol abuse.  With IV fluids this has come back to normal.  3.  Alcohol abuse with alcohol withdrawals.  The patient was seen by psychiatry who did not suggest any detoxing patient.  Also, patient refused to go to the psychiatry unit.  The patient is being monitored closely.  Does not have any withdrawals on the date of discharge, will be discharged to  skilled nursing facility with Valium for 3 more days to prevent any withdrawals.   On the day of discharge, patient has ambulated with physical therapy with significant help, has been recommended rehab.  Her temperature is 97.4, blood pressure 123/83, saturating 99% on room air.  Cardiovascular examination was normal.   DISCHARGE MEDICATIONS: 1.  Ciprofloxacin 250 mg oral 2 times a day for three days.  2.  Oxybutynin 5 mg oral once a day.  3.  Ferrous sulfate 325 mg oral 2 times a day.  4.  Omeprazole 20 mg oral 2 times a day.  5.  Docusate sodium 100 mg oral once a day.  6.  Multivitamin 1 tablet oral once a day.  7.  Calcium vitamin D 1 tablet oral once a day.  8.  Folic acid 0.4 mg oral once a day.  9.  Magnesium oxide 250 mg oral once a day.  10.  Acetaminophen 500 mg 2 tablets oral every 6 hours as needed for pain.  11.  Thiamine 100 mg oral once a day.  12.  Vitamin D3 1000 international units oral once a day.  13.  MiraLAX one dose oral once a day.  14.  Valium 2 mg oral 2 times a day for three days.   DISCHARGE INSTRUCTIONS:  The patient will be on a regular diet.  Activity as tolerated with assistance.  Work with physical therapy.  Follow up with primary care physician in 1 to 2 weeks.  Follow up at the [facility] for anemia in 1 to 2 weeks.   Time spent on day of discharge in discharge activity was 35 minutes.     ____________________________ Leia Alf Naquisha Whitehair, MD srs:ea D: 10/02/2012 14:47:47 ET T: 10/02/2012 15:17:53 ET JOB#: [id number]  cc: Alveta Heimlich R. Dillian Feig, MD, <Dictator> Neita Carp MD ELECTRONICALLY SIGNED 10/05/2012 14:59

## 2014-10-23 NOTE — H&P (Signed)
PATIENT NAME:  [name] Holmes, [name] Holmes MR#:  [id number] DATE OF BIRTH:  01-06-1935  DATE OF ADMISSION:  07/02/2011  REFERRING PHYSICIAN: Dr. Thomasene Lot  PRIMARY CARE PHYSICIAN: Dr. Delight Stare    REASON FOR ADMISSION: Abdominal pain, hematemesis, nausea.   HISTORY OF PRESENT ILLNESS: The patient is a difficult historian, but as per her daughter she is a 80 year old African American female with history of alcohol abuse and former smoker who presents with worsening 1-1/2 weeks of nausea, vomiting constantly, coffee-ground emesis, diarrhea with black stool, decreased appetite. The patient apparently drank before Christmas but has not been drinking. She did try to drink last night, but then vomited. She apparently drinks 4-5 glasses of vodka and beer a night. She was found to have a hemoglobin of 7, guaiac-positive stool, and heme-positive vomitus. We are asked to admit the patient for upper GI bleeding. She received potassium, PPI, Zofran, and normal saline.  The patient has abdominal pain which she cannot quantify.  She states it is just hunger pain. It starts in the epigastric area radiating to the back.  PRIMARY CARE PHYSICIAN:  Dr. Delight Stare  PAST MEDICAL HISTORY: 1. Alcohol abuse.  2. Chronic back pain.  3. Anemia.  4. Hypertension.  5. Right ankle injury.  6. Former tobacco abuse.   PAST SURGICAL HISTORY:  1. Hysterectomy.  2. Tumor of fallopian tube.   DRUG ALLERGIES: No known drug allergies.    MEDICATIONS:  1. Oxybutynin 5 mg daily. 2. Lisinopril/ hydrochlorothiazide 20/25, 1 tablet daily.  3. Atenolol 25 mg daily.   FAMILY HISTORY: Mother died of a brain tumor. Father died of a cerebrovascular accident. Sister died of alcohol-related complications. She was on dialysis. Son died of alcohol-related complications. He was also on dialysis.   SOCIAL HISTORY: She quit smoking about 20 years ago. She drinks 4-5 glasses of vodka and beer a day. She does not use illicit drugs. She is a  retired [employer]ar[employer] manager, lives at home.  REVIEW OF SYSTEMS:  CONSTITUTIONAL: She has fatigue and weakness. No fevers. EYES: No blurred vision, double vision, redness, inflammation, or glaucoma. ENT: No tinnitus, ear pain, hearing loss, seasonal allergies, epistaxis, or discharge. RESPIRATORY:  No cough, wheezing, hemoptysis, dyspnea, asthma, or painful respirations. CARDIOVASCULAR:  No chest pain, orthopnea, edema, arrhythmias, dyspnea on exertion, or palpitations. GI: Nausea, vomiting, diarrhea, abdominal pain, hematemesis, melena, and coffee-ground emesis. She cannot quantify the abdominal pain. She just states it is hunger pain.    GU: No dysuria, hematuria, renal calculi, frequency, or incontinence. GYN/breasts: No breast mass, tenderness, or vaginal discharge. ENDOCRINE: No polyuria, nocturia, thyroid problems, increased sweating, or heat or cold intolerance.  HEME/LYMPH: No anemia, easy bruising, or swollen glands. INTEGUMENT: No acne, rash, change in mole, hair, or skin. MUSCULOSKELETAL: No pain in back, shoulder, knee, or hip. No arthritis, swelling, or gout.  NEURO:  No numbness, weakness, dysarthria, epilepsy, tremor, vertigo, or ataxia. PSYCH: No anxiety, insomnia, ADD, bipolar, or depression.   PHYSICAL EXAMINATION:  VITAL SIGNS: Temperature 96.1, heart rate 110, respiratory rate 20, blood pressure 95/69, sating 100% on 2 liters.   GENERAL: The patient is well-developed, well-nourished.    HEENT: Pupils are equal and reactive to light and accommodation. Extraocular movements intact. Anicteric sclerae. No difficulty hearing. Pallor. Pale conjunctivae. She has dry mucous membranes.   NECK: No thyromegaly, no lymphadenopathy, no carotid bruits. No jugular venous distention.   LUNGS: Clear to auscultation. No adventitious breath sounds.   CARDIOVASCULAR: Regular rate and rhythm. Normal S1, S2.  No murmurs, gallops, or rubs appreciated.  No increased effort or use of accessory muscles. Tachycardic.  No  lower extremity edema. 2+ dorsalis pedis pulses present.   BREASTS: No obvious masses.   ABDOMEN: Soft, nontender, nondistended. Positive bowel sounds.   GU: Deferred.   MUSCULOSKELETAL: Strength 5/5. No clubbing, cyanosis, or degenerative joint disease.   SKIN: No rashes, lesions, or induration.  Warm to touch.  LYMPH: No lymphadenopathy in cervical or supraclavicular area.   NEUROLOGIC: Cranial nerves II through XII are intact. +2 reflexes.   PSYCH: Alert and oriented times three.   LABORATORY, DIAGNOSTIC, AND RADIOLOGICAL DATA: Glucose 117, BUN 31, creatinine 2.2, sodium 138, potassium 2.9, chloride 100, bicarbonate 20, anion gap is 18, calcium 7.2, total protein 6.7, total albumin 2.1, total bilirubin 0.4, alkaline phosphatase 40, AST 49, ALT 50. WBC 5.3, hemoglobin 7.2, hematocrit 21.8, platelets 249, MCV 97. EKG shows normal sinus rhythm with no ST changes.   ASSESSMENT AND PLAN: This is an unfortunate 80 year old African American female with  history of smoking, alcohol abuse, and hypertension who presents with hematemesis, GI bleed, nausea, and vomiting.  1. Hematemesis:  Concerning for upper GI bleed with possible varices as the patient has alcohol history.  We will monitor in the Critical Care Unit. Get two large-bore IV access and get GI consult. Put the patient on Protonix and octreotide drip.  2. Abdominal pain, nausea, vomiting: We will continue on Protonix and Zofran.  3. Anemia:  We will transfuse. We have consent from the patient. I explained the risks and benefits of blood transfusion. The patient and her daughter are agreeable.  4. Hypotension: We will give IV fluids and blood. 5. Acute renal failure: Most likely secondary to ATN.  This is from anemia and low blood pressure.  We will give IV fluids and repeat BMP.  6. Alcohol abuse and alcohol liver disease: We will put the patient on CIWA protocol. She right now has too low blood pressure to tolerate atenolol. Protect  for varices.  7. Hyperglycemia: This is most likely stress reaction.  8. Anion gap:  This most likely is from alcohol abuse. We will check arterial blood gas. 9. Hypokalemia: We will check magnesium and replete. 10. Low albumin: This is most likely from her poor synthetic function.  11. Deep vein thrombosis prophylaxis: Maintain with TED stockings and SCDs.  12. I discussed the case with Dr. Gustavo Lah, who agreed with admitting the patient to the Critical Care Unit. He will see her in the morning for possible endoscopy.   TOTAL TIME SPENT ON ADMISSION: 55 minutes of critical care time.  ____________________________ Judeth Horn Royden Purl, MD aaf:bjt D: 07/01/2011 19:54:44 ET T: 07/02/2011 07:28:06 ET JOB#: [id number]  cc: Mike Craze A. Royden Purl, MD, <Dictator> Marguerita Merles, MD Mike Craze Cassell Clement MD ELECTRONICALLY SIGNED 07/02/2011 21:29

## 2014-10-23 NOTE — Discharge Summary (Signed)
PATIENT NAME:  [name] Holmes, [name] Holmes MR#:  [id number] DATE OF BIRTH:  March 20, 1935  DATE OF ADMISSION:  07/01/2011 DATE OF DISCHARGE:  07/08/2011  DIAGNOSES:  1. Upper GI bleed status post upper GI endoscopy suggestive of erosive gastritis and gastric ulcer. Helicobacter pylori positive.  2. Acute anemia secondary to GI bleed and also folic acid deficiency status post transfusion.  3. Hypertension. 4. Acute renal failure, suspect chronic kidney disease, improved.  5. Alcohol dependence. 6. History of back pain. 7. Urinary tract infection, completed a seven-day course of antibiotics. 8. Hypocalcemia with hypoalbuminemia.  9. Vitamin D deficiency. 10. Hypophosphatemia.   CONSULTATIONS:  1. GI, Dr. Gustavo Lah   2. Nephrology, Dr. Candiss Norse  3. Psychiatry, Dr. Rhona Raider    PROCEDURE DONE: Upper GI endoscopy   HOSPITAL COURSE: This is a 80 year old female with history of alcohol dependence, former smoker who presented with abdominal pain, nausea, and hematemesis. She also complained of some melanotic stools. She was admitted for upper GI bleed secondary to peptic ulcer disease versus varices as patient had history of alcohol abuse. The patient was started on Protonix and octreotide drip to ICU. She had acute anemia. She was also given packed RBC transfusion. She was also found to be in acute renal failure. She was hypokalemic and hypoalbuminemic at admission. At admission she was found to have a hemoglobin of 7.2, MCV 97. Her white count was normal at 5.3. She was found to be having a creatinine of 2.2, hypokalemic with a potassium of 2.9, LFTs slightly elevated with AST of 49 and albumin of 2.1. Her gap was 18 when she came in with a bicarb of 20. Her magnesium was only 0.8 when she came in. She was given 2 units of packed RBC transfusion. She was seen by Dr. Gustavo Lah as GI consult. He suggested to continue the PPI and octreotide drip and she was taken to upper GI endoscopy on 07/03/2011 which showed that the  patient had a web in the upper third of the esophagus, nonobstructing Schatzki's ring, dilated with passage of scope, chronic gastritis, gastric ulcers, and normal duodenum. He suggested to start Prilosec p.o. twice a day. H. pylori serology was sent. She had no further hematemesis. Her hemoglobin stabilized and hemoglobin at discharge has improved to 9.4. She was also found to have folic acid deficiency. Her folic acid was found to be 2.6. She had Vitamin B12 levels which were on the higher side at 1106. She has been started on multivitamin, thiamine, and folic acid. She also got CIWA protocol because of her alcohol dependence. Will discharge her on multivitamin, thiamine, and folic acid. She also had acute renal failure. Her last creatinine in January of 2011 was normal at 1.18. Her creatinine has improved to 1.32 with IV hydration. Nephrology was consulted. Her urinalysis when she came in showed 3+ leukocyte esterase, 362 WBCs per high-power field, and cloudy urine. Cultures were negative but she was given seven days of ceftriaxone because of urinary tract infection. Her urine protein to creatinine ratio was 748 mg/g of creatinine. Her lisinopril/hydrochlorothiazide was stopped. She was hypotensive initially. All blood pressure medications were stopped. Now she is in the hypertensive range. We have started her on metoprolol and Norvasc. Her INR when she came in was 1.2. She also had other serologies done because of renal failure. She had Kappa/Lambda ratio 1.29. Her Vitamin D 25-hydroxy Vitamin D levels were low at 8.8. She got 50,000 units of Vitamin D and now she has been started on  Vitamin D supplementation. Her hemoglobin surface antigen is negative. Anti-HCV is negative. PTH is normal at 61. She was also found to be hypocalcemic, hypomagnesemic, and hypophosphatemic. She got potassium phosphate supplementation. Her repeat LFTs on January 1st showed AST improved to 37 with alkaline phosphatase of 37. She had  Helicobacter serology positive. She was IgG positive and IgA positive but IgM was negative. Discussed with GI. They suggested considering that she had a gastric ulcer to treat her with triple therapy so she is on PPI b.i.d. and I am going to give amoxicillin and clarithromycin for Helicobacter pylori treatment. Potassium is stable right now in the range of 4. Her ABG at admission showed pH of 7.44, pCO2 28, pO2 of 103. Her EKG shows sinus rhythm, nonspecific T wave changes. She is now tolerating diet. She was seen by Psychiatry also for detox evaluation. The patient refused to go to any inpatient detox. It was recommended that Ms. Pringle enter a residential rehab program for alcohol dependency recovery but the patient declined and she met no criteria for mandatory treatment. Physical therapy was called and they recommended short-term rehab. The patient is going to go to short-term rehab today.   MEDICATIONS AT DISCHARGE:  1. Ferrous sulfate 325 mg b.i.d.   2. Multivitamin 1 daily. 3. Thiamine 100 mg daily.  4. Tylenol 650 mg as needed.  5. Stool softener at bedtime.  6. Oxybutynin 5 mg daily. 7. Folic acid 1 mg daily.  8. Metoprolol 25 mg p.o. b.i.d.  9. Norvasc 5 mg p.o. once daily. 10. Omeprazole 20 mg p.o. b.i.d.  11. Amoxicillin 1 gram p.o. b.i.d. for eight days. 12. Clarithromycin 500 mg p.o. b.i.d. for eight days. This is for Helicobacter pylori.  13. Calcium carbonate 500 mg p.o. b.i.d.  14. Vitamin D3 1000 units p.o. once daily.   DO NOT TAKE: Lisinopril/hydrochlorothiazide.  DIET: Advised a low sodium diet.   ACTIVITY: Physical therapy at short-term rehab.   CONDITION AT DISCHARGE: She is comfortable. T-max 98.1, heart rate 69, blood pressure 144/87, saturating 100% on room air. CHEST is clear. ABDOMEN soft, nontender.   FOLLOW-UP:  1. The patient should follow-up with MD at short-term rehab or Dr. Delight Stare.  2. Follow-up hemoglobin and BMP in one week. 3. Follow-up with Dr.  Candiss Norse, [facility], in 3 to 4 weeks. 4. Follow-up with Dr. [facility] in 2 to 3 weeks.  TIME SPENT WITH DISCHARGE: 60 minutes.   ____________________________ Mena Pauls, MD ag:drc D: 07/08/2011 12:48:46 ET T: 07/08/2011 13:19:09 ET JOB#: [id number]  cc: Mena Pauls, MD, <Dictator> Lollie Sails, MD Murlean Iba, MD Marguerita Merles, MD Bagnell MD ELECTRONICALLY SIGNED 07/23/2011 17:36

## 2014-10-23 NOTE — Consult Note (Signed)
PATIENT NAME:  [name] Holmes, [name] Holmes MR#:  [id number] DATE OF BIRTH:  22-Dec-1934  DATE OF CONSULTATION:  07/02/2011  REFERRING PHYSICIAN:  Deanne Coffer, MD CONSULTING PHYSICIAN:  Lollie Sails, MD  REASON FOR CONSULTATION: Melena and coffee-ground emesis.   HISTORY OF PRESENT ILLNESS: [name] Holmes is a 80 year old African American female who presented to the hospital yesterday with complaint of diarrhea, nausea, and vomiting. She stated that she has been seeing some occasional black stools over a period of the past month or two. She also states that she had some emesis over the past week up until the day before admission of some coffee-ground material. She has seen no bright red blood in either the stool or the emesis material. She has never had an EGD. She says she has had a colonoscopy in the past but I could not find records of this in our hospital. She could not remember specifics. She denies any abdominal pain. She has had no emesis or bowel movement since admission. She does state that she takes Anacin on a regular basis for her foot pain. This was injured in an accident several years ago. She occasionally also takes NSAIDs. She currently takes no medications for her stomach. There has been no dizziness or syncope at home/presyncope at home. She currently denies any abdominal pain. There is no problem with heartburn or dysphagia.   GI FAMILY HISTORY: Negative for colorectal cancer, liver disease, or ulcers.   SOCIAL HISTORY: Remote tobacco use, none currently. Ongoing alcohol use, combined vodka and/or beer on a regular basis. She has done this for many, many years.   PAST MEDICAL HISTORY: Chronic back pain, anemia, hypertension, right ankle injury as noted, hysterectomy, tumor of a fallopian tube, history of alcohol abuse. She has apparently been admitted through [place] in this regard in the past.   OUTPATIENT MEDICATIONS:  1. Atenolol 25 mg daily.  2. Ferrous sulfate 325 mg twice  a day.  3. Folic acid 1 mg a day. 4. Hydrochlorothiazide/lisinopril 25/20 once a day.  5. MVI.  6. Oxybutynin 5 mg a day.  7. Potassium chloride 20 mEq once daily.  8. Stool softener at bedtime. 9. Thiamine 100 mg once a day.  10. Tylenol caplets p.r.n.   ALLERGIES: She has no known drug allergies.   REVIEW OF SYSTEMS: 10 systems reviewed per admission History and Physical, agree with same.   PHYSICAL EXAMINATION:  VITAL SIGNS: Temperature 96.3, pulse 66, respirations 28, blood pressure 94, pulse oximetry 100%.   GENERAL: She is an elderly appearing 80 year old African American female in no acute distress.   HEAD: Normocephalic, atraumatic.   EYES: Anicteric.   NOSE: Septum midline.   OROPHARYNX: Poor dentition.   NECK: No JVD.   HEART: Regular rate and rhythm.   LUNGS: Bilaterally clear.   ABDOMEN: Soft, nontender, and nondistended. Bowel sounds positive, normoactive.   RECTAL: Anorectal exam is deferred.   EXTREMITIES: No clubbing, cyanosis, or edema.   NEUROLOGICAL: Cranial nerves II through XII grossly intact. Muscle strength bilaterally equal and symmetric. Deep tendon reflexes bilaterally equal and symmetric.   LABORATORY, DIAGNOSTIC, AND RADIOLOGICAL DATA: On admission to the hospital she had a glucose of 117, BUN 31, creatinine 2.2, sodium 027, folic acid 2.6, potassium 2.9, chloride 100, bicarbonate 20, TIBC 86, calcium 7.2. Magnesium 0.8, lipase 249, ferritin 671. Hepatic profile showing an albumin of 2.1, total bilirubin 0.4, alkaline phosphatase 40, AST 49, ALT 15, troponin I times one was normal. Hemogram on admission showed  a white count of 5.3, hemoglobin/ hematocrit 7.2/21.8, platelet count 249, MCV 97. Hemogram repeated after 2 units of blood transfused showed hemoglobin/hematocrit to 9.8/29.4, platelet count 194. Urinalysis showed 3+ leukocyte esterase, 303 WBCs per high-power field. I do not believe this was a clean catch. There were no imaging studies.    ASSESSMENT: History of alcohol abuse, long-standing. Some recent evidence of hematemesis as well as some black stools. The patient has been hemodynamically stable. She had appropriate  elevation of her hemoglobin to transfusion. She is currently on a proton pump inhibitor as well as octreotide drip and has been hemodynamically stable.   RECOMMENDATIONS:  The patient has been taking NSAIDs as well as her history of alcohol abuse giving a broader differential diagnosis for upper gastrointestinal bleeding. Recommend EGD. I have discussed the risks, benefits, and complications of EGD to include but not limited to bleeding, infection, perforation, and the risk of sedation, and she wishes to proceed. We will need to do this with anesthesia assistance in light of her ongoing alcohol abuse and we will arrange for tomorrow afternoon.    ____________________________ Lollie Sails, MD mus:bjt D: 07/02/2011 14:16:31 ET T: 07/02/2011 15:02:49 ET JOB#: [id number]  cc: Lollie Sails, MD, <Dictator> Lollie Sails MD ELECTRONICALLY SIGNED 07/11/2011 10:03

## 2014-10-23 NOTE — Consult Note (Signed)
Chief Complaint:   Subjective/Chief Complaint no n/v or abd pain.  no recurrent emesis or bm.   VITAL SIGNS/ANCILLARY NOTES: **Vital Signs.:   02-Jan-13 11:39   Vital Signs Type Routine   Temperature Temperature (F) 96.4   Celsius 35.7   Temperature Source oral   Pulse Pulse 74   Pulse source per cardiac monitor   Respirations Respirations 15   Systolic BP Systolic BP 053   Diastolic BP (mmHg) Diastolic BP (mmHg) 73   Mean BP 91   Pulse Ox % Pulse Ox % 100   Pulse Ox Activity Level  At rest   Oxygen Delivery Room Air/ 21 %   Pulse Ox Heart Rate 74   Brief Assessment:   Cardiac Regular    Respiratory clear BS    Gastrointestinal details normal Soft  Nontender  Nondistended  No masses palpable  Bowel sounds normal   Routine Hem:  01-Jan-13 10:41    Hemoglobin (CBC) 9.8   Platelet Count (CBC) 194  Routine Coag:  01-Jan-13 16:19    INR 1.2  Routine Hem:  01-Jan-13 21:31    Hemoglobin (CBC) 9.8  02-Jan-13 04:35    Hemoglobin (CBC) 8.9  Routine Chem:  02-Jan-13 04:35    Glucose, Serum 125   BUN 21   Creatinine (comp) 1.73   Sodium, Serum 142   Potassium, Serum 4.1   Chloride, Serum 108   CO2, Serum 22   Calcium (Total), Serum 6.7   Osmolality (calc) 288   eGFR (African American) 37   eGFR (Non-African American) 31   Anion Gap 12   Assessment/Plan:  Assessment/Plan:   Assessment 1) coffeeground emesis, melena in the setting of asa and etoh use. stable.    Plan 1) egd today, further recs to follow.  I have discussed the risks benefits and complications of egd to include not limited to bleeding infection perforation and sedation and she wishes to proceed.   Electronic Signatures: Loistine Simas (MD)  (Signed 02-Jan-13 13:27)  Authored: Chief Complaint, VITAL SIGNS/ANCILLARY NOTES, Brief Assessment, Lab Results, Assessment/Plan   Last Updated: 02-Jan-13 13:27 by Loistine Simas (MD)

## 2014-10-23 NOTE — Discharge Summary (Signed)
PATIENT NAME:  Holmes, Holmes MR#:  [id number] DATE OF BIRTH:  [birth year]  DATE OF ADMISSION:  09/12/2011 DATE OF DISCHARGE:  09/14/2011  For a detailed note, please take a look at the history and physical done on admission by Dr. Karsten Fells.   DIAGNOSES AT DISCHARGE:  1. Symptomatic acute on chronic anemia, much improved.  2. History of alcohol abuse. 3. History of peptic ulcer disease and gastritis. 4. Hypertension. 5. Urinary incontinence. 6. Vitamin D deficiency.   DIET: Patient was discharged on a low-sodium diet.   ACTIVITY: As tolerated.   FOLLOW UP: Follow up with Dr. Delight Stare in the next 1 to 2 weeks.   DISCHARGE MEDICATIONS:  1. Lasix 10 mg daily.  2. Potassium 10 mEq daily.  3. Amlodipine 5 mg daily.  4. Calcium with vitamin D 1 tab b.i.d.  5. Colace 100 mg daily.  6. Iron sulfate 325 mg t.i.d.  7. Folic acid 1 mg daily.  8. Metoprolol tartrate 25 mg b.i.d.  9. Multivitamin daily.  10. Omeprazole 20 mg b.i.d.  11. Oxybutynin 5 mg daily.  12. Thiamine 100 mg daily.  13. Vitamin D3, 2000 international units daily.   LABORATORY, DIAGNOSTIC AND RADIOLOGICAL DATA: Pertinent studies done during the hospital course are as follows: Hemoglobin on admission 7.1. Hemoglobin on discharge 10.6.   BRIEF HOSPITAL COURSE: This is a 80 year old female with medical problems as mentioned above presented to the hospital secondary to weakness and dizziness likely a result of symptomatic anemia.  1. Symptomatic anemia. Patient presented to the hospital with a hemoglobin of 7.1. Patient was recently in the hospital in January of this past year when she was noted to be anemic, transfused and was noted to have an endoscopy showing gastritis with peptic ulcer disease. Patient was, therefore, admitted, transfused 2 units of packed red blood cells. Her hemoglobin posttransfusion did improve, so did her weakness and dizziness. She has not had a repeat GI evaluation or an endoscopy as it was  recently done in January of this year. Post transfusion since her hemoglobin stayed stable and she did not show any evidence of any worsening decompensation she was discharged home. She was discharged home on her iron and folic acid supplements.  2. Acute on chronic kidney disease. Patient's creatinine was at baseline. There was no acute issues related to this. This likely further needs to be followed up as an outpatient.  3. Hypokalemia. This was replaced and since then has improved and resolved.  4. Hypotension. This was probably secondary to anemia. Post transfusion patient's blood pressures have improved. She did not show any evidence of orthostasis. Patient can likely resume her other antihypertensives upon discharge.  5. Alcohol abuse. Patient was maintained on CIWA protocol during the hospital course although did not show any evidence of alcohol withdrawal.  6. Vitamin D deficiency. Patient was maintained on her vitamin D supplements, she will resume that.  7. Folic acid deficiency. Patient was maintained on her folic acid supplements and she will resume that too.  8. Patient was seen by physical therapy and they recommended home health physical therapy services although patient refused home health services. Patient currently is hemodynamically stable therefore being discharged home as stated.   TIME SPENT: 40 minutes.  ____________________________ Holmes Holmes. Holmes Carmine, MD vjs:cms D: 09/14/2011 16:29:49 ET T: 09/16/2011 10:45:49 ET  JOB#: [id number] cc: Holmes Holmes. Holmes Carmine, MD, <Dictator> Holmes Merles, MD Holmes Leber MD ELECTRONICALLY SIGNED 09/16/2011 16:13

## 2014-10-23 NOTE — Consult Note (Signed)
Chief Complaint:   Subjective/Chief Complaint tolerating full liquids, no abd pain,n/v.  no bm   VITAL SIGNS/ANCILLARY NOTES: **Vital Signs.:   03-Jan-13 09:28   Vital Signs Type Q 4hr   Temperature Temperature (F) 97.8   Celsius 36.5   Temperature Source oral   Pulse Pulse 81   Pulse source per Dinamap   Respirations Respirations 20   Systolic BP Systolic BP 845   Diastolic BP (mmHg) Diastolic BP (mmHg) 86   Mean BP 99   BP Source Dinamap   Pulse Ox % Pulse Ox % 99   Pulse Ox Activity Level  At rest   Oxygen Delivery Room Air/ 21 %   Brief Assessment:   Cardiac Regular    Respiratory clear BS    Gastrointestinal details normal Soft  Nontender  Nondistended  No masses palpable  Bowel sounds normal   Radiology Results: [place]:    03-Jan-13 08:55, US Abdomen General Survey   US Abdomen General Survey    PRELIMINARY REPORT    The following is a PRELIMINARY Radiology report.  A final report will follow pending radiologist verification.      REASON FOR EXAM:    ARF, CKD, Alcohol use  COMMENTS:       PROCEDURE: [place]  - US ABDOMEN GENERAL SURVEY  - Jul 04 2011  8:55AM     RESULT:     The liver, spleen, abdominal aorta and inferior vena cava show no   significant abnormalities. The pancreas is not visualized adequately for   evaluation on this exam. There is echogenic material in the gallbladder   compatible with a moderate amount of sludge. There are a few brighter   echoes within the sludge that in some views appear to shadow but the   finding is inconsistent and may be artifactual. Consequently, no   definitive stones are present but the possibility of faint poorly   shadowing stones cannot be entirely ruled out. There is no thickening of     the gallbladder wall which measures 1.6 mm in thickness. No   pericholecystic fluid is seen. The common bile duct measures 5.6 mm in   diameter which is within normal limits. The kidneys show no   hydronephrosis. Sagittally, the  right kidney measures 10.35 cm and the   left measures 9.25 cm. No ascites is seen. A small, right pleural   effusion is observed.    IMPRESSION:     1.  There is sludge in the gallbladder. No definite gallstones are   identified but as mentioned above, a few, tiny equivocal densities are   seen that could represent faint, poorly shadowing gallstones.  2.  There is no thickening of the gallbladder wall and no pericholecystic   fluid is seen.  3.  The pancreas is not visualized adequately for evaluation on this exam.  4.  There is a small, right pleural effusion.      Thank you for this opportunity to contribute to the care of your patient.           Dictated By: Dionne Ano WALL, M.D., MD   Assessment/Plan:  Assessment/Plan:   Assessment 1) hematemesis/black stools/anemia.  EGD with multiple gu/moderate to severe gastritis, likely related to her use of ASA and etoh.  discussed with patient.    Plan 1) continue full liquids today, recommend to low residue tomorrow, continue bid ppi.  Will check h.pylori.   Electronic Signatures: Loistine Simas (MD)  (Signed 03-Jan-13 12:30)  Authored: Chief Complaint, VITAL SIGNS/ANCILLARY NOTES, Brief Assessment, Radiology Results, Assessment/Plan   Last Updated: 03-Jan-13 12:30 by Loistine Simas (MD)

## 2014-10-23 NOTE — Consult Note (Signed)
Brief Consult Note: Diagnosis: upper gi bleeding with coffeeground emesis and black stools. ETOH abuse ongoing.   Patient was seen by consultant.   Consult note dictated.   Recommend to proceed with surgery or procedure.   Orders entered.   Comments: Please see full GI consult [phone].  Patietn admitted with anemia in the setting of asa use and etoh abuse, long term.  Broad DDx.  Will proceed with egd tomorrow pm.  Continue ppi and octreotide as you are. Will allow liquids non carbonated and no red. I have discussed the risks benefits and complications of egd to include not limited to bleeding infection perforation and sedation and she wishes to proceed.  Electronic Signatures: Loistine Simas (MD)  (Signed 01-Jan-13 14:17)  Authored: Brief Consult Note   Last Updated: 01-Jan-13 14:17 by Loistine Simas (MD)

## 2014-10-23 NOTE — H&P (Signed)
PATIENT NAME:  [name] Holmes, [name] Holmes MR#:  [id number] DATE OF BIRTH:  1934/08/11  DATE OF ADMISSION:  09/12/2011  PRIMARY CARE PHYSICIAN:  Dr. Delight Stare.  REFERRING PHYSICIAN: ER physician, Dr. Renard Hamper.   CHIEF COMPLAINT: Weakness, dizziness.   HISTORY OF PRESENT ILLNESS: The patient is a 80 year old female with past medical history of alcohol abuse, hypertension, acute and chronic kidney disease and vitamin D deficiency who was last admitted to [facility] on 07/01/2011 and discharged on 07/08/2011. At that time she was found to have upper gastrointestinal bleed and endoscopy showed erosive gastritis and a gastric ulcer. She was also H-pylori positive. She has completed treatments for H-pylori. She had acute anemia secondary to gastrointestinal bleed. She was also found to have folic acid deficiency. She had acute renal failure on chronic kidney disease, which was felt to be due to ATN. She was extensively counseled about alcohol abuse, however, she continues to do so. Her vitamin B12 level at that time was normal. Since January the patient has been having weakness and dizziness. She went to see her primary care physician on a scheduled appointment a couple of days ago and had some blood drawn. Yesterday she was called and informed that her blood counts are low and that she should come to the Emergency Room. The patient did not come yesterday. However today, she was convinced by her family to come. Her hemoglobin was found to be 7.18. The patient reports leg swelling, numbness in her legs. She says that she cannot feel the floor properly when she walks. The patient's daughter is at bedside and reports that the patient has not been compliant with the advice that she was given during her last hospitalization and continues to drink alcohol.   ALLERGIES: No known drug allergies.   PAST MEDICAL HISTORY:  1. History of alcohol abuse.  2. Chronic back  pain. 3. Anemia. 4. Hypertension. 5. Right ankle injury. 6. History of former tobacco abuse. 7. History of vitamin D deficiency. 8. History of multiple electrolyte abnormality during her last admission including hypocalcemia, hypophosphatemia and hypoalbuminemia. 9. History of acute and chronic kidney disease. The patient was admitted with a creatinine of 2.20 during her last hospitalization and discharged with a creatinine level of 1.40. 10. History of upper gastrointestinal bleed secondary to erosive gastritis and gastric ulcers. She was H. pylori positive which has been treated. She was also transfused blood during her last admission.   PAST SURGICAL HISTORY:  1. Hysterectomy. 2. Fallopian tube tumor.   FAMILY HISTORY: Mother died of a brain tumor. Father died of cerebrovascular accident. Sister died of alcohol related complications. She was also on dialysis.  SOCIAL HISTORY: The patient reports that she quit smoking 20 years ago. She continues to drink 3 to 4 times a week. She drinks vodka two to three shots and beer. As per the patient's daughter, when she drinks she drinks a lot. She does not abuse any illicit drugs. 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 Her grandson occasionally lives with her.   MEDICATIONS:  1. Tylenol 350 mg 2 tablets every six hours p.r.n.  2. Amlodipine 5 mg daily.  3. Calcium with vitamin D 1 tablet b.i.d.  4. Colace 100 mg daily.  5. Ferrous sulfate 325 mg t.i.d.  6. Folic acid 1 milligram daily.  7. Lasix 10 mg once a day. 8. Lopressor 25 mg b.i.d.  9. Multivitamin 1 tablet daily.  10. Omeprazole 20 mg b.i.d.  11. Oxybutynin  5 mg daily.  12. KCl 10 milliequivalents daily.  13. Thiamine 100 mg daily.  14. Vitamin D3 2000 international units once a day.   REVIEW OF SYSTEMS: CONSTITUTIONAL: The patient reports fatigue, weakness. EYES: Denies any blurred or double vision. ENT: Denies any tinnitus, ear pain. Reports that her dentures are hurting her and  that she has some ulcers in her mouth. RESPIRATORY: Denies any cough or wheezing. CARDIOVASCULAR: Denies any chest pain or orthopnea. GASTROINTESTINAL: Denies any nausea, vomiting, diarrhea, or abdominal pain. GU: Denies  dysuria or hematuria. ENDOCRINE: Denies any polyuria, nocturia. HEME/LYMPH: Denies any anemia or easy bruisability. INTEGUMENTARY: Denies acne or rash. MUSCULOSKELETAL: Has chronic back pain and leg swelling. NEUROLOGICAL: Reports numbness and weakness in her lower extremities. PSYCH: Denies any anxiety or depression.   PHYSICAL EXAMINATION:  VITAL SIGNS: Temperature 97, heart rate 98, respiratory rate 18, blood pressure 99/61, pulse oximetry 98% on room air.   GENERAL: The patient is a chronically ill-appearing female lying in bed, not in acute distress.   HEAD: Atraumatic, normocephalic.   EYES: There is muddy sclera. There is pallor. No cyanosis. Pupils equal, round and reactive to light and accommodation. Extraocular movements intact. The patient has arcus senilis.    ENT: Wet mucous membranes. Poor dental hygiene. When the patient removed her dentures, there was severe gingivitis in her upper gums with at least one shallow ulcer.   NECK: Supple. No masses. No JVD. No thyromegaly. No lymphadenopathy.   CHEST WALL: No tenderness to palpation. Not using accessory muscles of respiration. No intercostal muscle retractions.   LUNGS: Bilaterally clear to auscultation. No wheezing, rales or rhonchi.   CARDIOVASCULAR: S1 and S2 regular. No murmur, rubs, or gallops.   ABDOMEN: Soft, nontender, nondistended. No guarding. No rigidity. No organomegaly. Normal bowel sounds.   SKIN: No rashes or lesions.   PERIPHERIES: The patient has 1+ pedal edema, 1+ pedal pulses.   MUSCULOSKELETAL: No cyanosis or clubbing.   NEUROLOGIC: Awake, alert, oriented x3. Nonfocal neurological exam. Motor strength five out of five in all extremities. Cranial nerves are grossly intact.   PSYCH:  Normal mood and affect.   LABORATORY, DIAGNOSTIC AND RADIOLOGICAL DATA: White count 7.6, hemoglobin 7.1, hematocrit 21.1, platelet count 234. Glucose 77, BUN 18, creatinine 1.64, sodium 142, potassium 3.1, chloride 105, CO2 23, calcium 7.5. Normal bilirubin. Normal ALT and ALP. Elevated AST of 40. Albumin 2.4. Cardiac enzymes negative.   ASSESSMENT AND PLAN: A 80 year old female with history of alcohol abuse, hypertension, gastritis, who presents with symptomatic anemia.  1. Anemia. The patient's hemoglobin is 7.1. She had endoscopy in January 2013 which showed erosive gastritis and a gastric ulcer. She received blood transfusion during that admission. She was also found to have folic acid deficiency with normal B12. She was treated for H pylori. The patient denies any melena, hematochezia, hematemesis, or bleeding per rectum. Will check stool guaiacs. Continue her multivitamin, thiamine, folic acid and ferrous sulfate. Will transfuse her 2 units of blood and recheck her hemoglobin A1c.  2. Acute renal failure and chronic kidney disease. During the last hospitalization the patient was admitted with a creatinine of 2.20. At the time of discharge her creatinine was 1.40. A nephrology consultation had been obtained during that admission and her acute renal failure and chronic kidney disease was felt to be due to ATN. The patient was initially hypotensive. We will hold her antihypertensive medication. Monitor strict ins and outs and avoid nephrotoxic medications. Hopefully, after blood transfusion her kidney function  will improve.  3. Hypokalemia. We will replace.  4. Hypotension. Will hold her antihypertensive medications for the time being and monitor.  5. Alcohol abuse. The patient has been extensively counseled about alcohol cessation. Will place her on a CIWA protocol.  6. Elevated AST, possibly due to alcohol abuse.  7. History of vitamin D deficiency. Will continue the patient's vitamin D.  8. History  of folic acid deficiency. Will continue folic acid.  9. History of erosive gastritis and gastric ulcer. We will continue PPI.  10. Ambulatory dysfunction. Will get a PT evaluation and will place her on gastrointestinal and deep vein thrombosis prophylaxis.   Reviewed all medical records, discussed with the patient and her family the plan of care and management. Discussed CODE STATUS. The patient wishes to be a DO NOT RESUSCITATE.   TIME SPENT: 75 minutes.  ____________________________ Cherre Huger, MD sp:ap D: 09/12/2011 16:09:49 ET T: 09/12/2011 17:22:22 ET JOB#: [id number]  cc: Cherre Huger, MD, <Dictator> Marguerita Merles, MD  Cherre Huger MD ELECTRONICALLY SIGNED 09/13/2011 17:32

## 2014-10-23 NOTE — Discharge Summary (Signed)
PATIENT NAME:  [name] Holmes, [name] Holmes MR#:  [id number] DATE OF BIRTH:  08/15/1934  DATE OF ADMISSION:  07/01/2011 DATE OF DISCHARGE:  07/08/2011  ADDENDUM  I checked a magnesium level at the time of discharge, it was 0.8. I am going to give her 2 grams of mag sulfate here and give her magnesium oxide 400 mg b.i.d. for five days at the skilled nursing facility. Repeat magnesium level should be done in a week along with hemoglobin and BMP.  ____________________________ Mena Pauls, MD ag:cms D: 07/08/2011 13:13:06 ET T: 07/08/2011 13:18:37 ET JOB#: [id number]  cc: Mena Pauls, MD, <Dictator> [place] MD ELECTRONICALLY SIGNED 07/23/2011 17:34

## 2014-10-23 NOTE — Consult Note (Signed)
PATIENT NAME:  [name] Holmes, [name] Holmes MR#:  [id number] DATE OF BIRTH:  17-Jul-1934  DATE OF CONSULTATION:  07/05/2011  REFERRING PHYSICIAN:  Dr. Deanne Coffer  CONSULTING PHYSICIAN:  Drue Stager. [place], MD  REASON FOR CONSULTATION: Alcohol dependence, rule out withdrawal delirium.   HISTORY OF PRESENT ILLNESS: [name] Holmes is a 80 year old female admitted on 07/01/2011 due to GI bleed as well as rule out need for alcohol detox.   Initially she he was not an adequate historian, however, as her hospital days have passed she has regained memory function as well as orientation function.   She describes normal interests, hope as well as social support.   She is not having any hallucinations or delusions. Her orientation and memory function are intact.   She does acknowledge that the history from the family was correct; she was drinking five glasses of vodka per day prior to Christmas. It is unclear whether this was every day or intermittently. She states that once the gastrointestinal upset began that she was too sick to drink and was abstaining between 12/25 and the day of admission.   She has no intention of quitting alcohol use; however, she is well aware that treatment to abstain is available.   PAST PSYCHIATRIC HISTORY: [name] Holmes has undergone previous psychiatric care for alcohol dependence. She was admitted to the [facility] unit in November 2010. At that time she had had secondary falls at home and injury due to intoxication. She was drinking vodka and regularly at that time and was showing daily intoxification. It is noted that the family was much more motivated for her to undergo detox compared to the patient.   The past medical record also mentions that [name] Holmes saw a psychiatrist in [place] in her 60s to help stop drinking. She has experienced injury risk as well as secondary anemia with her alcohol use.   However, she has no history of major  depression, mania or primary hallucinations or delusions.   She has not undergone any psychotropic medication trials or mental illness other than detox regimen.   FAMILY PSYCHIATRIC HISTORY: None known.   SOCIAL HISTORY: [name] Holmes is originally from [place] where she was born. She resides in [place]. Siblings include three sisters and one brother. 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 Education is through the eighth grade. She is divorced, has three children. She resides in an apartment. She does not use illegal drugs. She is retired.   PAST MEDICAL HISTORY:  1. GI bleed with secondary anemia. 2. Acute renal failure   MEDICATIONS: The MAR is reviewed. Her psychotropics include the CIWA Ativan detox protocol and she has not required Ativan for withdrawal. She is undergoing thiamine and folate supplementation.   ALLERGIES: [name] Holmes has no known drug allergies.   LABORATORY, DIAGNOSTIC AND RADIOLOGICAL DATA: BUN 14, creatinine 1.62, SGOT 37, SGPT 15, total protein decreased at 5.9, bilirubin total was normal, bilirubin direct was normal, lipase decreased at 72.   REVIEW OF SYSTEMS: Constitutional, head, eyes, ears, nose, throat, mouth, neurologic, psychiatric, cardiovascular, respiratory, gastrointestinal, genitourinary, skin, musculoskeletal, hematologic, lymphatic, endocrine, metabolic all unremarkable.   PHYSICAL EXAMINATION:  VITAL SIGNS: Temperature 98, pulse 92, respiratory rate 18, blood pressure 128/85.   GENERAL APPEARANCE: [name] Holmes is an elderly female appearing younger than her chronologic age lying in a supine position in her hospital bed with no abnormal involuntary movements. She has no cachexia. Her muscle tone is normal. She is  well groomed and shows normal hygiene.   [name] Holmes is alert. She is oriented to all spheres. Her eye contact is good. Her concentration is mildly decreased. Her memory is intact to immediate, recent, and remote. Three out of three objects  names immediately, two out of three at five minutes. Her fund of knowledge, language use and intelligence are within normal limits. Her speech is mildly soft but does involve normal rate and prosody without dysarthria. Thought process is logical, coherent, and goal directed. No looseness of associations. Thought content: No thoughts of harming herself, no thoughts of harming others. No delusions, no hallucinations. Her insight is intact. Judgment is intact. Affect is mildly flat at baseline but broad and appropriate as the interview progresses. Mood is within normal limits.   ASSESSMENT:  AXIS I:  1. Delirium due to multiple factors, now resolved.  2. Alcohol dependence.   AXIS II: Deferred.   AXIS III: See past medical history. She has an acute history of GI bleed with secondary anemia.   AXIS IV: General medical.   AXIS V: 55.   [name] Holmes is not at risk to harm herself or others. She agrees to call emergency services immediately for any thoughts of harming himself, thoughts of harming others, or distress.   The undersigned emphatically recommend that [name] Holmes enter a residential rehabilitation program for alcohol dependence recovery, however, the patient declined and she meets no criteria for mandatory treatment. Her refusal of rehabilitation includes any outpatient forms as well as 12 step groups.   She does state that if she changes her mind she will notify her primary care provider for access to psychiatric tools of rehabilitation.   RECOMMENDATIONS: As above. Would continue her thiamine supplementation at home. ____________________________ Drue Stager. Nasteho Glantz, MD jsw:cms D: 07/05/2011 22:42:13 ET T: 07/06/2011 12:45:02 ET  JOB#: [id number] [name] Ruddy MD ELECTRONICALLY SIGNED 07/07/2011 21:56

## 2014-11-03 ENCOUNTER — Encounter: Payer: Self-pay | Admitting: Emergency Medicine

## 2014-11-03 ENCOUNTER — Emergency Department: Payer: Medicare Other

## 2014-11-03 ENCOUNTER — Inpatient Hospital Stay
Admission: EM | Admit: 2014-11-03 | Discharge: 2014-11-08 | DRG: 683 | Disposition: A | Discharge status: Home Health | Payer: Medicare Other | Attending: Internal Medicine | Admitting: Internal Medicine

## 2014-11-03 DIAGNOSIS — L89312 Pressure ulcer of right buttock, stage 2: Secondary | ICD-10-CM | POA: Diagnosis present

## 2014-11-03 DIAGNOSIS — Z87891 Personal history of nicotine dependence: Secondary | ICD-10-CM | POA: Diagnosis not present

## 2014-11-03 DIAGNOSIS — I129 Hypertensive chronic kidney disease with stage 1 through stage 4 chronic kidney disease, or unspecified chronic kidney disease: Secondary | ICD-10-CM | POA: Diagnosis present

## 2014-11-03 DIAGNOSIS — Z79899 Other long term (current) drug therapy: Secondary | ICD-10-CM

## 2014-11-03 DIAGNOSIS — B9620 Unspecified Escherichia coli [E. coli] as the cause of diseases classified elsewhere: Secondary | ICD-10-CM | POA: Diagnosis present

## 2014-11-03 DIAGNOSIS — L89322 Pressure ulcer of left buttock, stage 2: Secondary | ICD-10-CM | POA: Diagnosis present

## 2014-11-03 DIAGNOSIS — Z8711 Personal history of peptic ulcer disease: Secondary | ICD-10-CM

## 2014-11-03 DIAGNOSIS — N184 Chronic kidney disease, stage 4 (severe): Secondary | ICD-10-CM | POA: Diagnosis present

## 2014-11-03 DIAGNOSIS — D638 Anemia in other chronic diseases classified elsewhere: Secondary | ICD-10-CM | POA: Diagnosis present

## 2014-11-03 DIAGNOSIS — E039 Hypothyroidism, unspecified: Secondary | ICD-10-CM | POA: Diagnosis present

## 2014-11-03 DIAGNOSIS — N3000 Acute cystitis without hematuria: Secondary | ICD-10-CM

## 2014-11-03 DIAGNOSIS — R809 Proteinuria, unspecified: Secondary | ICD-10-CM | POA: Diagnosis present

## 2014-11-03 DIAGNOSIS — D509 Iron deficiency anemia, unspecified: Secondary | ICD-10-CM | POA: Diagnosis present

## 2014-11-03 DIAGNOSIS — L89152 Pressure ulcer of sacral region, stage 2: Secondary | ICD-10-CM | POA: Diagnosis present

## 2014-11-03 DIAGNOSIS — R531 Weakness: Secondary | ICD-10-CM | POA: Diagnosis present

## 2014-11-03 DIAGNOSIS — N189 Chronic kidney disease, unspecified: Secondary | ICD-10-CM

## 2014-11-03 DIAGNOSIS — M109 Gout, unspecified: Secondary | ICD-10-CM | POA: Diagnosis present

## 2014-11-03 DIAGNOSIS — N179 Acute kidney failure, unspecified: Principal | ICD-10-CM | POA: Diagnosis present

## 2014-11-03 DIAGNOSIS — F1020 Alcohol dependence, uncomplicated: Secondary | ICD-10-CM | POA: Diagnosis present

## 2014-11-03 DIAGNOSIS — K449 Diaphragmatic hernia without obstruction or gangrene: Secondary | ICD-10-CM | POA: Diagnosis present

## 2014-11-03 DIAGNOSIS — D649 Anemia, unspecified: Secondary | ICD-10-CM | POA: Diagnosis present

## 2014-11-03 DIAGNOSIS — M1990 Unspecified osteoarthritis, unspecified site: Secondary | ICD-10-CM | POA: Diagnosis present

## 2014-11-03 DIAGNOSIS — N390 Urinary tract infection, site not specified: Secondary | ICD-10-CM

## 2014-11-03 HISTORY — DX: Gout, unspecified: M10.9

## 2014-11-03 HISTORY — DX: Unspecified osteoarthritis, unspecified site: M19.90

## 2014-11-03 HISTORY — DX: Essential (primary) hypertension: I10

## 2014-11-03 LAB — COMPREHENSIVE METABOLIC PANEL
ALBUMIN: 2.5 g/dL — AB (ref 3.5–5.0)
ALT: 17 U/L (ref 14–54)
ANION GAP: 10 (ref 5–15)
AST: 33 U/L (ref 15–41)
Alkaline Phosphatase: 72 U/L (ref 38–126)
BUN: 72 mg/dL — AB (ref 6–20)
CALCIUM: 8.9 mg/dL (ref 8.9–10.3)
CHLORIDE: 102 mmol/L (ref 101–111)
CO2: 26 mmol/L (ref 22–32)
Creatinine, Ser: 5.1 mg/dL — ABNORMAL HIGH (ref 0.44–1.00)
GFR calc Af Amer: 9 mL/min — ABNORMAL LOW (ref >60)
GFR calc non Af Amer: 7 mL/min — ABNORMAL LOW (ref >60)
GLUCOSE: 89 mg/dL (ref 65–99)
Potassium: 4.7 mmol/L (ref 3.5–5.1)
SODIUM: 138 mmol/L (ref 135–145)
TOTAL PROTEIN: 7 g/dL (ref 6.5–8.1)
Total Bilirubin: 0.6 mg/dL (ref 0.3–1.2)

## 2014-11-03 LAB — CBC WITH DIFFERENTIAL/PLATELET
Basophils Absolute: 0.1 10*3/uL (ref 0–0.1)
Basophils Relative: 1 %
EOS PCT: 2 %
Eosinophils Absolute: 0.2 10*3/uL (ref 0–0.7)
HCT: 23.8 % — ABNORMAL LOW (ref 35.0–47.0)
Hemoglobin: 7.6 g/dL — ABNORMAL LOW (ref 12.0–16.0)
Lymphocytes Relative: 15 %
Lymphs Abs: 1.4 10*3/uL (ref 1.0–3.6)
MCH: 31.1 pg (ref 26.0–34.0)
MCHC: 31.8 g/dL — ABNORMAL LOW (ref 32.0–36.0)
MCV: 97.6 fL (ref 80.0–100.0)
MONO ABS: 0.6 10*3/uL (ref 0.2–0.9)
MONOS PCT: 7 %
Neutro Abs: 6.9 10*3/uL — ABNORMAL HIGH (ref 1.4–6.5)
Neutrophils Relative %: 75 %
PLATELETS: 225 10*3/uL (ref 150–440)
RBC: 2.44 MIL/uL — ABNORMAL LOW (ref 3.80–5.20)
RDW: 16.7 % — ABNORMAL HIGH (ref 11.5–14.5)
WBC: 9 10*3/uL (ref 3.6–11.0)

## 2014-11-03 LAB — URINALYSIS COMPLETE WITH MICROSCOPIC (ARMC ONLY)
Bilirubin Urine: NEGATIVE
Glucose, UA: NEGATIVE mg/dL
Ketones, ur: NEGATIVE mg/dL
NITRITE: NEGATIVE
Protein, ur: 100 mg/dL — AB
SPECIFIC GRAVITY, URINE: 1.011 (ref 1.005–1.030)
pH: 6 (ref 5.0–8.0)

## 2014-11-03 LAB — TSH: TSH: 2.354 u[IU]/mL (ref 0.350–4.500)

## 2014-11-03 LAB — ETHANOL: Alcohol, Ethyl (B): <5 mg/dL (ref <5)

## 2014-11-03 LAB — TROPONIN I: TROPONIN I: 0.04 ng/mL — AB (ref <0.031)

## 2014-11-03 LAB — LIPASE, BLOOD: Lipase: 28 U/L (ref 22–51)

## 2014-11-03 MED ORDER — SODIUM CHLORIDE 0.9 % IV SOLN
INTRAVENOUS | Status: DC
  Administered 2014-11-03 – 2014-11-06 (×6): via INTRAVENOUS

## 2014-11-03 MED ORDER — FEBUXOSTAT 40 MG PO TABS
40.0000 mg | ORAL_TABLET | Freq: Every day | ORAL | Status: DC
  Administered 2014-11-03 – 2014-11-07 (×5): 40 mg via ORAL
  Filled 2014-11-03 (×10): qty 1

## 2014-11-03 MED ORDER — VITAMIN D 1000 UNITS PO TABS
1000.0000 [IU] | ORAL_TABLET | Freq: Every day | ORAL | Status: DC
  Administered 2014-11-03 – 2014-11-07 (×5): 1000 [IU] via ORAL
  Filled 2014-11-03 (×5): qty 1

## 2014-11-03 MED ORDER — FOLIC ACID 1 MG PO TABS
ORAL_TABLET | ORAL | Status: AC
  Administered 2014-11-03: 1 mg via ORAL
  Filled 2014-11-03: qty 1

## 2014-11-03 MED ORDER — ADULT MULTIVITAMIN W/MINERALS CH
1.0000 | ORAL_TABLET | Freq: Every day | ORAL | Status: DC
  Administered 2014-11-03 – 2014-11-07 (×5): 1 via ORAL
  Filled 2014-11-03 (×8): qty 1

## 2014-11-03 MED ORDER — PREDNISONE 20 MG PO TABS
20.0000 mg | ORAL_TABLET | Freq: Every day | ORAL | Status: DC
  Administered 2014-11-04 – 2014-11-07 (×4): 20 mg via ORAL
  Filled 2014-11-03 (×5): qty 1

## 2014-11-03 MED ORDER — PANTOPRAZOLE SODIUM 40 MG PO TBEC
40.0000 mg | DELAYED_RELEASE_TABLET | Freq: Every day | ORAL | Status: DC
  Administered 2014-11-03 – 2014-11-07 (×5): 40 mg via ORAL
  Filled 2014-11-03 (×5): qty 1

## 2014-11-03 MED ORDER — FUROSEMIDE 40 MG PO TABS
40.0000 mg | ORAL_TABLET | Freq: Every day | ORAL | Status: DC
  Administered 2014-11-03: 40 mg via ORAL
  Filled 2014-11-03: qty 1

## 2014-11-03 MED ORDER — VITAMIN B-1 100 MG PO TABS
ORAL_TABLET | ORAL | Status: AC
  Administered 2014-11-03: 100 mg via ORAL
  Filled 2014-11-03: qty 1

## 2014-11-03 MED ORDER — ALLOPURINOL 100 MG PO TABS: 100.0000 mg | ORAL_TABLET | Freq: Every day | ORAL | Status: DC

## 2014-11-03 MED ORDER — FERROUS SULFATE 325 (65 FE) MG PO TABS
325.0000 mg | ORAL_TABLET | Freq: Every day | ORAL | Status: DC
  Administered 2014-11-03 – 2014-11-07 (×5): 325 mg via ORAL
  Filled 2014-11-03 (×6): qty 1

## 2014-11-03 MED ORDER — POLYETHYLENE GLYCOL 3350 17 G PO PACK
17.0000 g | PACK | Freq: Every day | ORAL | Status: DC
  Administered 2014-11-03 – 2014-11-07 (×5): 17 g via ORAL
  Filled 2014-11-03 (×5): qty 1

## 2014-11-03 MED ORDER — DOCUSATE SODIUM 100 MG PO CAPS
100.0000 mg | ORAL_CAPSULE | Freq: Every day | ORAL | Status: DC | PRN
  Filled 2014-11-03: qty 1

## 2014-11-03 MED ORDER — ACETAMINOPHEN 325 MG PO TABS
650.0000 mg | ORAL_TABLET | Freq: Four times a day (QID) | ORAL | Status: DC | PRN
Start: 2014-11-03 — End: 2014-11-08
  Administered 2014-11-03 – 2014-11-06 (×5): 650 mg via ORAL
  Filled 2014-11-03 (×6): qty 2

## 2014-11-03 MED ORDER — VITAMIN B-1 100 MG PO TABS
100.0000 mg | ORAL_TABLET | Freq: Every day | ORAL | Status: DC
  Administered 2014-11-03 – 2014-11-07 (×5): 100 mg via ORAL
  Filled 2014-11-03 (×10): qty 1

## 2014-11-03 MED ORDER — VITAMIN B-1 100 MG PO TABS
100.0000 mg | ORAL_TABLET | Freq: Once | ORAL | Status: AC
  Administered 2014-11-03: 100 mg via ORAL

## 2014-11-03 MED ORDER — CEFTRIAXONE SODIUM 1 G IJ SOLR
1.0000 g | Freq: Once | INTRAMUSCULAR | Status: AC
  Administered 2014-11-03: 1 g via INTRAVENOUS

## 2014-11-03 MED ORDER — FOLIC ACID 1 MG PO TABS
1.0000 mg | ORAL_TABLET | Freq: Every day | ORAL | Status: DC
  Administered 2014-11-03 – 2014-11-07 (×5): 1 mg via ORAL
  Filled 2014-11-03 (×5): qty 1

## 2014-11-03 MED ORDER — HEPARIN SODIUM (PORCINE) 5000 UNIT/ML IJ SOLN
5000.0000 [IU] | Freq: Three times a day (TID) | INTRAMUSCULAR | Status: DC
  Administered 2014-11-03 – 2014-11-08 (×15): 5000 [IU] via SUBCUTANEOUS
  Filled 2014-11-03 (×17): qty 1

## 2014-11-03 MED ORDER — OXYBUTYNIN CHLORIDE ER 5 MG PO TB24
5.0000 mg | ORAL_TABLET | Freq: Every day | ORAL | Status: DC
  Administered 2014-11-03 – 2014-11-07 (×5): 5 mg via ORAL
  Filled 2014-11-03 (×6): qty 1

## 2014-11-03 MED ORDER — LEVOTHYROXINE SODIUM 25 MCG PO TABS
50.0000 ug | ORAL_TABLET | Freq: Every day | ORAL | Status: DC
  Administered 2014-11-03 – 2014-11-06 (×3): 50 ug via ORAL
  Filled 2014-11-03 (×3): qty 2
  Filled 2014-11-03: qty 1

## 2014-11-03 MED ORDER — FOLIC ACID 1 MG PO TABS
1.0000 mg | ORAL_TABLET | Freq: Once | ORAL | Status: AC
  Administered 2014-11-03: 1 mg via ORAL

## 2014-11-03 MED ORDER — CEFTRIAXONE SODIUM 1 G IJ SOLR
INTRAMUSCULAR | Status: AC
  Administered 2014-11-03: 1 g via INTRAVENOUS
  Filled 2014-11-03: qty 10

## 2014-11-03 MED ORDER — DEXTROSE 5 % IV SOLN
INTRAVENOUS | Status: AC
  Filled 2014-11-03: qty 10

## 2014-11-03 MED ORDER — CEFTRIAXONE SODIUM IN DEXTROSE 20 MG/ML IV SOLN
1.0000 g | INTRAVENOUS | Status: DC
  Administered 2014-11-04: 1 g via INTRAVENOUS
  Filled 2014-11-03 (×2): qty 50

## 2014-11-03 NOTE — Evaluation (Signed)
Occupational Therapy Evaluation Patient Details Name: [name] Holmes MRN: [id number] DOB: [birth year] Today's Date: 11/03/2014    History of Present Illness 80 yo female with onset of UTI and cardiomegaly with acute chronic kidney disease and elevated troponin was admitted for elevated creatinine.  PMHx:  OA, HTN, anemia, gout, alcohol abuse   Clinical Impression   Pt is 80 year old female who was brought to [facility] by EMS due to not being able to get up off of couch.  She presented with a severe UTI with elevated creatinine and smelled of strong urine.  Pt indicated that she has a CNA who comes to see her every day and a RN who comes to set up meds once or twice a week but it is not known if information is accurate.  She presents with delayed response, flat affect, overall deconditioning and pain 10/10 in bilateral ankles from gout.  She would benefit from skilled OT services for ADL training, functional mobility training and therapeutic exercises to increase strength for ADLs.  She would also benefit from SNF to further increase strength, mobility, ADLs and safety at home.    Follow Up Recommendations  SNF    Equipment Recommendations       Recommendations for Other Services PT consult     Precautions / Restrictions Precautions Precautions: Fall Precaution Comments: Pt complaining of feeling really cold and already had 4 blankets on.  Added another thick blanket and re-set thermostat Restrictions Weight Bearing Restrictions: No      Mobility Bed Mobility Overal bed mobility: Needs Assistance Bed Mobility: Supine to Sit;Sit to Supine     Supine to sit: Mod assist Sit to supine: Mod assist   General bed mobility comments: needs reminders for all steps including reaching for rails and to reposition on bed with delay in processing and focused on feeling very cold and ankles hurting  Transfers Overall transfer level: Needs assistance Equipment used: 1 person hand held  assist Transfers: Sit to/from Stand Sit to Stand: Mod assist              Balance Overall balance assessment: Needs assistance Sitting-balance support: Bilateral upper extremity supported Sitting balance-Leahy Scale: Good   Postural control: Posterior lean Standing balance support: Bilateral upper extremity supported Standing balance-Leahy Scale: Poor                              ADL Overall ADL's : Needs assistance/impaired Eating/Feeding: Minimal assistance;Set up   Grooming: Wash/dry hands;Wash/dry face;Oral care;Applying deodorant;Brushing hair;Set up;Min guard           Upper Body Dressing : Min guard;Sitting   Lower Body Dressing: Moderate assistance;Cueing for sequencing;Bed level (bed level and sitting EOB due to pain in B ankles from gout) Lower Body Dressing Details (indicate cue type and reason): pt with pain 10/10 in ankles so unable to test pants over hips standing  Toilet Transfer:  (unable to assess due to pain in B ankles)             General ADL Comments: Pt able to sit EOB and able to reach feet for LB dressing but unable to stand for pants over hips due to pain in B ankles 10/10     Vision     Perception     Praxis      Pertinent Vitals/Pain Pain Assessment: Faces Faces Pain Scale: Hurts worst Pain Location: B ankles from gout Pain Intervention(s): Limited  activity within patient's tolerance;Monitored during session;Premedicated before session;Repositioned     Hand Dominance Right   Extremity/Trunk Assessment Upper Extremity Assessment Upper Extremity Assessment: Generalized weakness   Lower Extremity Assessment Lower Extremity Assessment: Generalized weakness   Cervical / Trunk Assessment Cervical / Trunk Assessment: Kyphotic   Communication Communication Communication: Expressive difficulties;Other (comment) (word finding difficulty to express self and c/o mouth sores from dentures not fitting well)   Cognition  Arousal/Alertness: Awake/alert Behavior During Therapy: WFL for tasks assessed/performed Overall Cognitive Status: Impaired/Different from baseline Area of Impairment: Attention;Following commands;Safety/judgement;Awareness;Problem solving   Current Attention Level: Divided Memory: Decreased short-term memory Following Commands: Follows one step commands inconsistently Safety/Judgement: Decreased awareness of safety;Decreased awareness of deficits Awareness: Intellectual Problem Solving: Decreased initiation;Slow processing;Difficulty sequencing;Requires verbal cues;Requires tactile cues General Comments: Pt gives some history with inconsistencies and not clear why patient was receiving daily CNA and RN 1-2 times a week   General Comments       Exercises Exercises: Other exercises (4+ strength throughout LE's)     Shoulder Instructions      Home Living Family/patient expects to be discharged to:: Private residence Living Arrangements: Alone Available Help at Discharge: Personal care attendant;Family Type of Home: House Home Access: Level entry     Home Layout: One level     Bathroom Shower/Tub: Other (comment) (pt states she only does sponge bathing sitting on raised toilet  with help from CNA as needed)   Bathroom Toilet: Handicapped height Bathroom Accessibility: No   Home Equipment: [place] - 2 wheels;Cane - quad;Grab bars - toilet;Shower seat          Prior Functioning/Environment Level of Independence: Needs assistance  Gait / Transfers Assistance Needed: quad cane and RW for supported gait ADL's / Homemaking Assistance Needed: caregiver by CNA every day and Rn 1-2 times a week for meds        OT Diagnosis:     OT Problem List: Decreased strength;Pain;Decreased cognition;Decreased coordination   OT Treatment/Interventions:      OT Goals(Current goals can be found in the care plan section) Acute Rehab OT Goals Patient Stated Goal: to go home OT Goal  Formulation: With patient Time For Goal Achievement: 11/17/14 Potential to Achieve Goals: Good  OT Frequency:     Barriers to D/C:            Co-evaluation              End of Session Nurse Communication: Other (comment) (unable to use phone to call her daughter and Danae Chen from nsg assisted with phoning her daughter)  Activity Tolerance: Patient limited by fatigue;Patient limited by pain Patient left: in bed;with call bell/phone within reach;with bed alarm set   Time: 1630-1700 OT Time Calculation (min): 30 min Charges:  OT Evaluation $Initial OT Evaluation Tier I: 1 Procedure OT Treatments $Self Care/Home Management : 8-22 mins G-Codes:    Rajan Burgard 11/23/2014, 5:26 PM    Chrys Racer, OTR/L

## 2014-11-03 NOTE — H&P (Addendum)
[name] Holmes is an 80 y.o. female.   Chief Complaint: Weakness HPI: The patient called emergency medical services because she was too weak to get off of her couch. Her niece who is one of her caregivers states that the patient does not frequently moved from that location. She was found and urine-soaked clothes by EMS. She also admits that she has fallen twice this month. She denies chest pain shortness of breath nausea vomiting or diarrhea. In the emergency room and she was found to have a urinary tract infection and a dramatic increase in her creatinine which prompted emergency department staff to call for admission.  Past Medical History  Diagnosis Date  . Arthritis   . Hypertension   . Anemia   . Gout     Past Surgical History  Procedure Laterality Date  . Abdominal hysterectomy      Family History  Problem Relation Age of Onset  . Hypertension Father    Social History:  reports that she has quit smoking. She does not have any smokeless tobacco history on file. She reports that she drinks about 7.2 oz of alcohol per week. Her drug history is not on file.  Allergies: No Known Allergies   (Not in a hospital admission)  Results for orders placed or performed during the hospital encounter of 11/03/14 (from the past 48 hour(s))  Comprehensive metabolic panel     Status: Abnormal   Collection Time: 11/03/14  4:39 AM  Result Value Ref Range   Sodium 138 135 - 145 mmol/L   Potassium 4.7 3.5 - 5.1 mmol/L   Chloride 102 101 - 111 mmol/L   CO2 26 22 - 32 mmol/L   Glucose, Bld 89 65 - 99 mg/dL   BUN 72 (H) 6 - 20 mg/dL   Creatinine, Ser 5.10 (H) 0.44 - 1.00 mg/dL   Calcium 8.9 8.9 - 10.3 mg/dL   Total Protein 7.0 6.5 - 8.1 g/dL   Albumin 2.5 (L) 3.5 - 5.0 g/dL   AST 33 15 - 41 U/L   ALT 17 14 - 54 U/L   Alkaline Phosphatase 72 38 - 126 U/L   Total Bilirubin 0.6 0.3 - 1.2 mg/dL   GFR calc non Af Amer 7 (L) >60 mL/min   GFR calc Af Amer 9 (L) >60 mL/min    Comment: (NOTE) The  eGFR has been calculated using the CKD EPI equation. This calculation has not been validated in all clinical situations. eGFR's persistently <90 mL/min signify possible Chronic Kidney Disease.    Anion gap 10 5 - 15  Ethanol     Status: None   Collection Time: 11/03/14  4:39 AM  Result Value Ref Range   Alcohol, Ethyl (B) <5 <5 mg/dL    Comment:        LOWEST DETECTABLE LIMIT FOR SERUM ALCOHOL IS 11 mg/dL FOR MEDICAL PURPOSES ONLY   Lipase, blood     Status: None   Collection Time: 11/03/14  4:39 AM  Result Value Ref Range   Lipase 28 22 - 51 U/L  Troponin I     Status: Abnormal   Collection Time: 11/03/14  4:39 AM  Result Value Ref Range   Troponin I 0.04 (H) <0.031 ng/mL    Comment: READ BACK AND VERIFIED C/ TO ANN CALES @0609  11/03/14 BY AJO        PERSISTENTLY INCREASED TROPONIN VALUES IN THE RANGE OF 0.04-0.49 ng/mL CAN BE SEEN IN:       -UNSTABLE  ANGINA       -CONGESTIVE HEART FAILURE       -MYOCARDITIS       -CHEST TRAUMA       -ARRYHTHMIAS       -LATE PRESENTING MYOCARDIAL INFARCTION       -COPD   CLINICAL FOLLOW-UP RECOMMENDED.   CBC with Differential     Status: Abnormal   Collection Time: 11/03/14  4:39 AM  Result Value Ref Range   WBC 9.0 3.6 - 11.0 K/uL   RBC 2.44 (L) 3.80 - 5.20 MIL/uL   Hemoglobin 7.6 (L) 12.0 - 16.0 g/dL   HCT 23.8 (L) 35.0 - 47.0 %   MCV 97.6 80.0 - 100.0 fL   MCH 31.1 26.0 - 34.0 pg   MCHC 31.8 (L) 32.0 - 36.0 g/dL   RDW 16.7 (H) 11.5 - 14.5 %   Platelets 225 150 - 440 K/uL   Neutrophils Relative % 75 %   Neutro Abs 6.9 (H) 1.4 - 6.5 K/uL   Lymphocytes Relative 15 %   Lymphs Abs 1.4 1.0 - 3.6 K/uL   Monocytes Relative 7 %   Monocytes Absolute 0.6 0.2 - 0.9 K/uL   Eosinophils Relative 2 %   Eosinophils Absolute 0.2 0 - 0.7 K/uL   Basophils Relative 1 %   Basophils Absolute 0.1 0 - 0.1 K/uL  Urinalysis complete, with microscopic     Status: Abnormal   Collection Time: 11/03/14  4:39 AM  Result Value Ref Range   Color, Urine  YELLOW (A) YELLOW   APPearance TURBID (A) CLEAR   Glucose, UA NEGATIVE NEGATIVE mg/dL   Bilirubin Urine NEGATIVE NEGATIVE   Ketones, ur NEGATIVE NEGATIVE mg/dL   Specific Gravity, Urine 1.011 1.005 - 1.030   Hgb urine dipstick 2+ (A) NEGATIVE   pH 6.0 5.0 - 8.0   Protein, ur 100 (A) NEGATIVE mg/dL   Nitrite NEGATIVE NEGATIVE   Leukocytes, UA 3+ (A) NEGATIVE   RBC / HPF TOO NUMEROUS TO COUNT 0 - 5 RBC/hpf   WBC, UA TOO NUMEROUS TO COUNT 0 - 5 WBC/hpf   Bacteria, UA MANY (A) NONE SEEN   Squamous Epithelial / LPF TOO NUMEROUS TO COUNT (A) NONE SEEN   WBC Clumps PRESENT    Dg Chest 2 View  11/03/2014   CLINICAL DATA:  Increasing weakness, recent falls.  EXAM: CHEST  2 VIEW  COMPARISON:  Chest radiograph April 07, 2014  FINDINGS: The cardiac silhouette appears mildly enlarged, mediastinal silhouette is nonsuspicious, mildly tortuous aorta. No pleural effusion or focal consolidation. No pneumothorax. Soft tissue planes and included osseous structures are nonsuspicious. Patient is osteopenic. Moderate degenerative change of thoracic spine.  IMPRESSION: Mild cardiomegaly, no acute pulmonary process.   Electronically Signed   By: Elon Alas   On: 11/03/2014 05:23   Ct Head Wo Contrast  11/03/2014   CLINICAL DATA:  Increasing weakness for a few days, multiple falls over last 6 months.  EXAM: CT HEAD WITHOUT CONTRAST  TECHNIQUE: Contiguous axial images were obtained from the base of the skull through the vertex without intravenous contrast.  COMPARISON:  CT of the head June 06, 2014  FINDINGS: Severe ventriculomegaly, likely on the basis of global parenchymal brain volume loss as there is overall commensurate enlargement of cerebral sulci and cerebellar folia, unchanged. No intraparenchymal hemorrhage, mass effect, midline shift or acute large vascular territory infarct. Moderate white matter changes, similar to prior examination.  No abnormal extra-axial fluid collections. Mild calcific  atherosclerosis of  the carotid siphons and included vertebral arteries. Remote LEFT medial orbital blowout fracture. Ocular globes and orbital contents are otherwise unremarkable. The visualized paranasal sinuses and mastoid air cells are well aerated. No skull fracture.  IMPRESSION: No acute intracranial process.  Severe parenchymal brain volume loss. Moderate white matter changes suggest chronic small vessel ischemic disease.   Electronically Signed   By: Elon Alas   On: 11/03/2014 06:47    Review of Systems  Constitutional: Negative for fever and chills.  HENT: Negative for sore throat and tinnitus.   Eyes: Negative for blurred vision and redness.  Respiratory: Negative for cough and shortness of breath.   Cardiovascular: Negative for chest pain, palpitations, orthopnea and PND.  Gastrointestinal: Negative for nausea, vomiting, abdominal pain and diarrhea.  Genitourinary: Negative for dysuria, urgency and frequency.  Musculoskeletal: Positive for falls. Negative for myalgias and joint pain.       Weakness  Skin: Negative for rash.       Sore areas on buttocks  Neurological: Negative for speech change, focal weakness and weakness.  Endo/Heme/Allergies: Does not bruise/bleed easily.       No temperature intolerance  Psychiatric/Behavioral: Negative for depression and suicidal ideas.    Blood pressure 139/83, pulse 86, temperature 98.3 F (36.8 C), temperature source Oral, resp. rate 15, height 5' 4"  (1.626 m), weight 51.909 kg (114 lb 7 oz), SpO2 99 %. Physical Exam  Vitals reviewed. Constitutional: She is oriented to person, place, and time. She appears well-developed and well-nourished.  HENT:  Head: Normocephalic and atraumatic.  Eyes: EOM are normal. Pupils are equal, round, and reactive to light.  Neck: Normal range of motion. No tracheal deviation present. No thyromegaly present.  Cardiovascular: Normal rate, regular rhythm and normal heart sounds.  Exam reveals no gallop  and no friction rub.   No murmur heard. Respiratory: Effort normal and breath sounds normal.  GI: Soft. Bowel sounds are normal. She exhibits no distension. There is tenderness in the epigastric area and suprapubic area.    Lymphadenopathy:    She has no cervical adenopathy.  Neurological: She is alert and oriented to person, place, and time. No cranial nerve deficit. She exhibits normal muscle tone.  Skin: Skin is warm and dry. Lesion noted.     Psychiatric: She has a normal mood and affect. Judgment and thought content normal.     Assessment/Plan This is a 80 year old female admitted for urinary tract infection and acute on chronic kidney disease. 1. Urinary tract infection: The patient has received Rocephin in the emergency department and we will continue IV antibiotics until she is no longer symptomatic.  2. Acute on chronic kidney disease: The patient now has stage IV chronic kidney disease. We will obtain a nephrology consult and gently hydrate the patient. At this time her electrolytes and BUN do not require dialysis. 3. Anemia: This is likely multifactorial. She has been seen by hematology in the past and has a history of upper GI bleed. She has no symptoms of upper GI bleeding at this time however she does admit that her upper abdomen is tender to palpation. She could have some gastritis as well as iron deficiency anemia. Hematology consult at the discretion of the primary care team. 4. Weakness: This is likely secondary to a combination of the above factors. Notably the patient's niece and caregiver stated that she has required blood transfusions in the past when she has become too weak to stand. Also check vitamin D  5. Hypothyroidism: Check  TSH. Continue Synthroid 6. Gout: The patient currently does not have a gout flare. We will continue Uloric as well as allopurinol in renal dosing 7. History of alcohol abuse: The patient's blood alcohol level is currently 0. It's unclear when  she last had a drink. We will institute a CIWA protocol 8. DVT prophylaxis: Heparin 9. GI prophylaxis: Pantoprazole due to history of gastric bleed The patient is a full code time spent on admission orders and patient care approximately 40 minutes.  Harrie Foreman 11/03/2014, 7:34 AM

## 2014-11-03 NOTE — ED Notes (Signed)
Dr Marcille Blanco in to assess pt for admission.

## 2014-11-03 NOTE — Progress Notes (Signed)
[facility]  ROUNDING NOTE   Subjective:   [name] Holmes was admitted this morning with weakness and found to have urinary tract infection. Patient's baseline creatinine of 1.38, eGFR of 48 from 06/06/2014. Last seen in our office, 03/2013 for chronic kidney disease stage III.  On admission, patient found to have creatinine of 5.1 and eGFR of 72.  Patient states she does not have an appetite.  Started on IV fluids NS at 164m/hr Patient given ceftriaxone in ED.   Objective:  Vital signs in last 24 hours:  Temp:  [97.7 F (36.5 C)-98.3 F (36.8 C)] 97.7 F (36.5 C) (05/05 0859) Pulse Rate:  [80-92] 80 (05/05 0859) Resp:  [12-20] 18 (05/05 0859) BP: (92-139)/(65-83) 123/68 mmHg (05/05 0859) SpO2:  [98 %-100 %] 100 % (05/05 0859) Weight:  [51.909 kg (114 lb 7 oz)] 51.909 kg (114 lb 7 oz) (05/05 0411)  Weight change:  Filed Weights   11/03/14 0411  Weight: 51.909 kg (114 lb 7 oz)    Intake/Output: I/O last 3 completed shifts: In: -  Out: 25 [Urine:25]   Intake/Output this shift:     Physical Exam: General: NAD,   Head: Normocephalic, atraumatic. Dry mucosal membranes  Eyes: Anicteric, PERRL  Neck: Supple, trachea midline  Lungs:  Clear to auscultation  Heart: Regular rate and rhythm  Abdomen:  Soft, nontender,   Extremities: no peripheral edema.  Neurologic: Nonfocal, moving all four extremities, alert and oriented.   Skin: No lesions       Basic Metabolic Panel:  Recent Labs Lab 11/03/14 0439  NA 138  K 4.7  CL 102  CO2 26  GLUCOSE 89  BUN 72*  CREATININE 5.10*  CALCIUM 8.9    Liver Function Tests:  Recent Labs Lab 11/03/14 0439  AST 33  ALT 17  ALKPHOS 72  BILITOT 0.6  PROT 7.0  ALBUMIN 2.5*    Recent Labs Lab 11/03/14 0439  LIPASE 28   No results for input(s): AMMONIA in the last 168 hours.  CBC:  Recent Labs Lab 11/03/14 0439  WBC 9.0  NEUTROABS 6.9*  HGB 7.6*  HCT 23.8*  MCV 97.6  PLT 225    Cardiac  Enzymes:  Recent Labs Lab 11/03/14 0439  TROPONINI 0.04*    BNP: Invalid input(s): POCBNP  CBG: No results for input(s): GLUCAP in the last 168 hours.  Microbiology: Results for orders placed or performed during the hospital encounter of 11/03/14  Urine culture     Status: None (Preliminary result)   Collection Time: 11/03/14  4:39 AM  Result Value Ref Range Status   Specimen Description URINE, CATHETERIZED  Final   Special Requests NONE  Final   Culture NO GROWTH < 12 HOURS  Final   Report Status PENDING  Incomplete    Coagulation Studies: No results for input(s): LABPROT, INR in the last 72 hours.  Urinalysis:  Recent Labs  11/03/14 0439  COLORURINE YELLOW*  LABSPEC 1.011  PHURINE 6.0  GLUCOSEU NEGATIVE  HGBUR 2+*  BILIRUBINUR NEGATIVE  KETONESUR NEGATIVE  PROTEINUR 100*  NITRITE NEGATIVE  LEUKOCYTESUR 3+*      Imaging: Dg Chest 2 View  11/03/2014   CLINICAL DATA:  Increasing weakness, recent falls.  EXAM: CHEST  2 VIEW  COMPARISON:  Chest radiograph April 07, 2014  FINDINGS: The cardiac silhouette appears mildly enlarged, mediastinal silhouette is nonsuspicious, mildly tortuous aorta. No pleural effusion or focal consolidation. No pneumothorax. Soft tissue planes and included osseous structures are nonsuspicious. Patient  is osteopenic. Moderate degenerative change of thoracic spine.  IMPRESSION: Mild cardiomegaly, no acute pulmonary process.   Electronically Signed   By: Elon Alas   On: 11/03/2014 05:23   Ct Head Wo Contrast  11/03/2014   CLINICAL DATA:  Increasing weakness for a few days, multiple falls over last 6 months.  EXAM: CT HEAD WITHOUT CONTRAST  TECHNIQUE: Contiguous axial images were obtained from the base of the skull through the vertex without intravenous contrast.  COMPARISON:  CT of the head June 06, 2014  FINDINGS: Severe ventriculomegaly, likely on the basis of global parenchymal brain volume loss as there is overall commensurate  enlargement of cerebral sulci and cerebellar folia, unchanged. No intraparenchymal hemorrhage, mass effect, midline shift or acute large vascular territory infarct. Moderate white matter changes, similar to prior examination.  No abnormal extra-axial fluid collections. Mild calcific atherosclerosis of the carotid siphons and included vertebral arteries. Remote LEFT medial orbital blowout fracture. Ocular globes and orbital contents are otherwise unremarkable. The visualized paranasal sinuses and mastoid air cells are well aerated. No skull fracture.  IMPRESSION: No acute intracranial process.  Severe parenchymal brain volume loss. Moderate white matter changes suggest chronic small vessel ischemic disease.   Electronically Signed   By: Elon Alas   On: 11/03/2014 06:47     Medications:   . sodium chloride 100 mL/hr at 11/03/14 1029   . [START ON 11/04/2014] cefTRIAXone (ROCEPHIN)  IV  1 g Intravenous Q24H  . cholecalciferol  1,000 Units Oral Daily  . cefTRIAXone (ROCEPHIN) IVPB 1 gram/50 mL D5W      . febuxostat  40 mg Oral Daily  . ferrous sulfate  325 mg Oral Q breakfast  . folic acid  1 mg Oral Daily  . furosemide  40 mg Oral Daily  . heparin  5,000 Units Subcutaneous 3 times per day  . levothyroxine  50 mcg Oral QAC breakfast  . multivitamin with minerals  1 tablet Oral Daily  . oxybutynin  5 mg Oral QHS  . pantoprazole  40 mg Oral Daily  . polyethylene glycol  17 g Oral Daily  . thiamine  100 mg Oral Daily   acetaminophen, docusate sodium  Assessment/ Plan:  80 y.o. black female with chronic alcoholism, anemia, gout, hypertension admitted on 11/03/2014 For urinary tract infection and acute renal failure on chronic kidney disease stage III.   1. Acute renal failure on chronic kidney disease stage III with proteinuria: history suggestive of prerenal azotemia. Agree with IV fluids. Renally dose all medications. Monitor urine output. No acute indication for dialysis at this time.    2. Urinary Tract Infection: empiric coverage with ceftriaxone. Urine cultures pending.   3. Anemia: hemoglobin 7.6. With history of GI bleed and alcohol abuse. No acute indication for transfusion. May need further GI work up.     LOS: 0 [name] 5/5/201611:52 AM

## 2014-11-03 NOTE — ED Notes (Signed)
Call placed to patients niece [name] Holmes. Ms. Owens Shark reports pt has hx of anemia and has had to have blood transfusions in the past and has been followed by hematologist at the cancer center in the past for her anemia. She also reports pt has been becoming increasingly weak over the last few weeks. Contact information placed on chart for niece.

## 2014-11-03 NOTE — Evaluation (Signed)
Physical Therapy Evaluation Patient Details Name: [name] Holmes MRN: [id number] DOB: 10-29-1934 Today's Date: 11/03/2014   History of Present Illness  80 yo female with onset of UTI and cardiomegaly with acute chronic kidney disease and elevated troponin was admitted for elevated creatinine.  PMHx:  OA, HTN, anemia, gout, alcohol abuse  Clinical Impression  Pt was able to sidestep bedside with PT offering mod assist, now is planned for SNF placement as pt is too weak to stand alone.  Her plan is to increase time OOB to chair and to increase gait distance.    Follow Up Recommendations SNF    Equipment Recommendations  None recommended by PT    Recommendations for Other Services       Precautions / Restrictions Precautions Precautions: Fall Precaution Comments: Pt is uncomfortable in [facility] and asked to be covered up more. Restrictions Weight Bearing Restrictions: No      Mobility  Bed Mobility Overal bed mobility: Needs Assistance Bed Mobility: Supine to Sit;Sit to Supine     Supine to sit: Mod assist Sit to supine: Mod assist   General bed mobility comments: needs reminders for all steps including reaching for rails and to reposition on bed  Transfers Overall transfer level: Needs assistance Equipment used: 1 person hand held assist Transfers: Sit to/from Stand Sit to Stand: Mod assist            Ambulation/Gait Ambulation/Gait assistance: Mod assist Ambulation Distance (Feet): 3 Feet Assistive device: 1 person hand held assist Gait Pattern/deviations: Step-to pattern;Decreased dorsiflexion - right;Decreased dorsiflexion - left;Wide base of support;Narrow base of support;Trunk flexed Gait velocity: reduced Gait velocity interpretation: Below normal speed for age/gender General Gait Details: Pt is fatigued from her medical issues and was not able to be assisted  to walk more than the short trip.  Pt is having some success wtih PT holding her, will try walker next  visit.  Stairs            Wheelchair Mobility    Modified Rankin (Stroke Patients Only)       Balance Overall balance assessment: Needs assistance Sitting-balance support: Bilateral upper extremity supported Sitting balance-Leahy Scale: Good   Postural control: Posterior lean Standing balance support: Bilateral upper extremity supported Standing balance-Leahy Scale: Poor                               Pertinent Vitals/Pain Pain Assessment: Faces Faces Pain Scale: Hurts worst Pain Location: B ankles due to gout Pain Intervention(s): Limited activity within patient's tolerance;Monitored during session;Premedicated before session;Repositioned    Home Living Family/patient expects to be discharged to:: Private residence Living Arrangements: Alone Available Help at Discharge: Personal care attendant;Family Type of Home: House Home Access: Level entry     Home Layout: One level Home Equipment: [place] - 2 wheels;Cane - quad;Grab bars - toilet;Shower seat      Prior Function Level of Independence: Needs assistance   Gait / Transfers Assistance Needed: quad cane and RW for supported gait  ADL's / Homemaking Assistance Needed: caregiver        Hand Dominance        Extremity/Trunk Assessment   Upper Extremity Assessment: Generalized weakness           Lower Extremity Assessment: Generalized weakness      Cervical / Trunk Assessment: Kyphotic  Communication   Communication: Expressive difficulties;Other (comment) (unclear speech)  Cognition Arousal/Alertness: Awake/alert Behavior During Therapy: [facility]  for tasks assessed/performed Overall Cognitive Status: Impaired/Different from baseline Area of Impairment: Attention;Following commands;Safety/judgement;Awareness;Problem solving   Current Attention Level: Divided Memory: Decreased short-term memory;Decreased recall of precautions Following Commands: Follows one step commands  inconsistently Safety/Judgement: Decreased awareness of safety;Decreased awareness of deficits Awareness: Intellectual Problem Solving: Decreased initiation;Slow processing;Difficulty sequencing;Requires verbal cues;Requires tactile cues General Comments: Pt gives some history with inconsistencies    General Comments General comments (skin integrity, edema, etc.): Pt is having some difficulty with controlling sit and standi balance due to gouty ankles and limited upper body strength.       Exercises        Assessment/Plan    PT Assessment Patient needs continued PT services  PT Diagnosis Difficulty walking   PT Problem List Decreased strength;Decreased range of motion;Decreased activity tolerance;Decreased balance;Decreased mobility;Decreased coordination;Decreased cognition;Decreased knowledge of use of DME;Decreased safety awareness;Decreased knowledge of precautions;Cardiopulmonary status limiting activity;Pain  PT Treatment Interventions Gait training;Stair training;Functional mobility training;Therapeutic activities;Therapeutic exercise;Balance training;Neuromuscular re-education;Cognitive remediation;Patient/family education;Wheelchair mobility training   PT Goals (Current goals can be found in the Care Plan section) Acute Rehab PT Goals Patient Stated Goal: to go home PT Goal Formulation: With patient Time For Goal Achievement: 11/17/14 Potential to Achieve Goals: Good    Frequency Min 2X/week   Barriers to discharge Decreased caregiver support;Other (comment) (home alone at night per pt)      Co-evaluation               End of Session   Activity Tolerance: No increased pain;Patient tolerated treatment well Patient left: in bed;with call bell/phone within reach;with bed alarm set Nurse Communication: Mobility status         Time: 1601-0932 PT Time Calculation (min) (ACUTE ONLY): 40 min   Charges:   PT Evaluation $Initial PT Evaluation Tier I: 1  Procedure PT Treatments $Gait Training: 8-22 mins $Therapeutic Exercise: 8-22 mins   PT G Codes:        Chad Cordial, PT MS Acute Rehab Dept. Number: [phone] 11/03/2014, 4:45 PM

## 2014-11-03 NOTE — ED Notes (Addendum)
Patient transported to CT 

## 2014-11-03 NOTE — ED Provider Notes (Addendum)
[facility] Emergency Department Provider Note  ____________________________________________  Time seen: 4:00 AM  I have reviewed the triage vital signs and the nursing notes.   HISTORY  Chief Complaint Weakness and Leg Pain  Exam limited by patient being poor historian.  HPI [name] Holmes is a 80 y.o. female who called EMS tonight because she was having trouble sitting herself upright on her couch by herself. However when EMS found her they noted that she was in poor health with very poor hygiene smelling of urine. The patient reports that she has primarily been living on her couch in her home for the past several months. He has that she has severe pain in her ankles when she stands up and has fallen down several times last couple months, the last most recent time being one week ago. The patient denies any chest pain, shortness of breath, headache, vision changes, abdominal pain. She only complains of pain in the gluteal area.     Past Medical History  Diagnosis Date  . Arthritis     There are no active problems to display for this patient.   History reviewed. No pertinent past surgical history.  Current Outpatient Rx  Name  Route  Sig  Dispense  Refill  . acetaminophen (TYLENOL) 325 MG tablet   Oral   Take 650 mg by mouth every 6 (six) hours as needed.         Marland Kitchen allopurinol (ZYLOPRIM) 100 MG tablet   Oral   Take 100 mg by mouth daily.         . cholecalciferol (VITAMIN D) 1000 UNITS tablet   Oral   Take 1,000 Units by mouth daily.         Marland Kitchen docusate sodium (COLACE) 100 MG capsule   Oral   Take 100 mg by mouth daily as needed for mild constipation (at night).         . febuxostat (ULORIC) 40 MG tablet   Oral   Take 40 mg by mouth daily.         . ferrous sulfate 325 (65 FE) MG tablet   Oral   Take 325 mg by mouth daily with breakfast.         . folic acid (FOLVITE) 1 MG tablet   Oral   Take 1 mg by mouth daily.          . furosemide (LASIX) 40 MG tablet   Oral   Take 40 mg by mouth daily.         Marland Kitchen levothyroxine (SYNTHROID, LEVOTHROID) 50 MCG tablet   Oral   Take 50 mcg by mouth daily before breakfast.         . loperamide (IMODIUM A-D) 2 MG tablet   Oral   Take 2 mg by mouth 4 (four) times daily as needed for diarrhea or loose stools.         . magnesium oxide (MAG-OX) 400 MG tablet   Oral   Take 400 mg by mouth daily.         Marland Kitchen omeprazole (PRILOSEC) 20 MG capsule   Oral   Take 20 mg by mouth 2 (two) times daily before a meal.         . oxybutynin (DITROPAN-XL) 5 MG 24 hr tablet   Oral   Take 5 mg by mouth at bedtime.         . polyethylene glycol (MIRALAX / GLYCOLAX) packet   Oral   Take  17 g by mouth daily.         . potassium chloride (K-DUR) 10 MEQ tablet   Oral   Take 10 mEq by mouth daily.         Marland Kitchen thiamine 100 MG tablet   Oral   Take 100 mg by mouth daily.           Allergies Review of patient's allergies indicates no known allergies.  History reviewed. No pertinent family history.  Social History History  Substance Use Topics  . Smoking status: Former Research scientist (life sciences)  . Smokeless tobacco: Not on file  . Alcohol Use: 7.2 oz/week    12 Cans of beer per week    Review of Systems  Constitutional: No fever or chills. No weight changes Eyes:No blurry vision or double vision.  ENT: No sore throat. Cardiovascular: No chest pain. Respiratory: No dyspnea or cough. Gastrointestinal: Negative for abdominal pain, vomiting and diarrhea.  No BRBPR or melena. Genitourinary: Negative for dysuria, urinary retention, bloody urine, or difficulty urinating. Musculoskeletal: Negative for back pain. Bilateral ankle swelling and pain, chronic Skin: Negative for rash. Neurological: Negative for headaches, focal weakness or numbness. Psychiatric:No anxiety or depression.   Endocrine:No hot/cold intolerance, changes in energy, or sleep difficulty.  10-point ROS  otherwise negative.  ____________________________________________   PHYSICAL EXAM:  VITAL SIGNS: ED Triage Vitals  Enc Vitals Group     BP 11/03/14 0411 125/80 mmHg     Pulse Rate 11/03/14 0411 92     Resp 11/03/14 0615 13     Temp 11/03/14 0411 98.3 F (36.8 C)     Temp Source 11/03/14 0411 Oral     SpO2 11/03/14 0411 100 %     Weight 11/03/14 0411 114 lb 7 oz (51.909 kg)     Height 11/03/14 0411 5\' 4"  (1.626 m)     Head Cir --      Peak Flow --      Pain Score 11/03/14 0413 6     Pain Loc --      Pain Edu? --      Excl. in [place]? --      Constitutional: Alert and oriented. No distress. Eyes: No scleral icterus. No conjunctival pallor. PERRL. EOMI ENT   Head: Normocephalic and atraumatic.   Nose: No congestion/rhinnorhea. No septal hematoma   Mouth/Throat: Dry membranes, no pharyngeal erythema   Neck: No stridor. No SubQ emphysema.  Hematological/Lymphatic/Immunilogical: No cervical lymphadenopathy. Cardiovascular: RRR. Normal and symmetric distal pulses are present in all extremities. No murmurs, rubs, or gallops. Respiratory: Normal respiratory effort without tachypnea nor retractions. Breath sounds are clear and equal bilaterally. No wheezes/rales/rhonchi. Gastrointestinal: Soft and nontender. No distention. There is no CVA tenderness.  No rebound, rigidity, or guarding. There is a stage II sacral decubitus ulcer bilaterally at the upper gluteal cleft. Genitourinary: deferred Musculoskeletal: Nontender with normal range of motion in all extremities. No joint effusions.  No lower extremity tenderness. 1+ edema bilateral ankles. Neurologic:   Normal speech and language.  CN 2-10 normal. Motor grossly intact. No gross focal neurologic deficits are appreciated.  Skin:  Skin is warm, dry and intact. No rash noted.  No petechiae, purpura, or bullae. Psychiatric: Mood and affect are normal. Speech and behavior are normal. Patient has poor insight into her  condition.  ____________________________________________    LABS (pertinent positives/negatives)  Results for orders placed or performed during the hospital encounter of 11/03/14  Comprehensive metabolic panel  Result Value Ref Range   Sodium  138 135 - 145 mmol/L   Potassium 4.7 3.5 - 5.1 mmol/L   Chloride 102 101 - 111 mmol/L   CO2 26 22 - 32 mmol/L   Glucose, Bld 89 65 - 99 mg/dL   BUN 72 (H) 6 - 20 mg/dL   Creatinine, Ser 5.10 (H) 0.44 - 1.00 mg/dL   Calcium 8.9 8.9 - 10.3 mg/dL   Total Protein 7.0 6.5 - 8.1 g/dL   Albumin 2.5 (L) 3.5 - 5.0 g/dL   AST 33 15 - 41 U/L   ALT 17 14 - 54 U/L   Alkaline Phosphatase 72 38 - 126 U/L   Total Bilirubin 0.6 0.3 - 1.2 mg/dL   GFR calc non Af Amer 7 (L) >60 mL/min   GFR calc Af Amer 9 (L) >60 mL/min   Anion gap 10 5 - 15  Lipase, blood  Result Value Ref Range   Lipase 28 22 - 51 U/L  Troponin I  Result Value Ref Range   Troponin I 0.04 (H) <0.031 ng/mL  CBC with Differential  Result Value Ref Range   WBC 9.0 3.6 - 11.0 K/uL   RBC 2.44 (L) 3.80 - 5.20 MIL/uL   Hemoglobin 7.6 (L) 12.0 - 16.0 g/dL   HCT 23.8 (L) 35.0 - 47.0 %   MCV 97.6 80.0 - 100.0 fL   MCH 31.1 26.0 - 34.0 pg   MCHC 31.8 (L) 32.0 - 36.0 g/dL   RDW 16.7 (H) 11.5 - 14.5 %   Platelets 225 150 - 440 K/uL   Neutrophils Relative % 75 %   Neutro Abs 6.9 (H) 1.4 - 6.5 K/uL   Lymphocytes Relative 15 %   Lymphs Abs 1.4 1.0 - 3.6 K/uL   Monocytes Relative 7 %   Monocytes Absolute 0.6 0.2 - 0.9 K/uL   Eosinophils Relative 2 %   Eosinophils Absolute 0.2 0 - 0.7 K/uL   Basophils Relative 1 %   Basophils Absolute 0.1 0 - 0.1 K/uL  Urinalysis complete, with microscopic  Result Value Ref Range   Color, Urine YELLOW (A) YELLOW   APPearance TURBID (A) CLEAR   Glucose, UA NEGATIVE NEGATIVE mg/dL   Bilirubin Urine NEGATIVE NEGATIVE   Ketones, ur NEGATIVE NEGATIVE mg/dL   Specific Gravity, Urine 1.011 1.005 - 1.030   Hgb urine dipstick 2+ (A) NEGATIVE   pH 6.0 5.0 - 8.0    Protein, ur 100 (A) NEGATIVE mg/dL   Nitrite NEGATIVE NEGATIVE   Leukocytes, UA 3+ (A) NEGATIVE   RBC / HPF TOO NUMEROUS TO COUNT 0 - 5 RBC/hpf   WBC, UA TOO NUMEROUS TO COUNT 0 - 5 WBC/hpf   Bacteria, UA MANY (A) NONE SEEN   Squamous Epithelial / LPF TOO NUMEROUS TO COUNT (A) NONE SEEN   WBC Clumps PRESENT      ____________________________________________   EKG  Normal sinus rhythm rate of 88. Normal axis, normal intervals, poor R-wave progression in anterior precordial leads. Normal ST and T segments.  ____________________________________________    RADIOLOGY  Chest x-ray unremarkable  ____________________________________________   PROCEDURES  ____________________________________________   INITIAL IMPRESSION / ASSESSMENT AND PLAN / ED COURSE  Pertinent labs & imaging results that were available during my care of the patient were reviewed by me and considered in my medical decision making (see chart for details).  On arrival patient is clearly ill appearing. This could be due to alcohol abuse, chronic deconditioning, sepsis, urinary tract infection, Wernicke's encephalopathy, or dehydration. We'll check labs, chest x-ray,  UA.  ----------------------------------------- 6:26 AM on 11/03/2014 -----------------------------------------  Workup reveals a severe urinary tract infection and acute renal failure. Patient is given ceftriaxone IV, and a urine culture is sent. I discussed the patient's care with the hospitalist Dr. Marcille Blanco will evaluate the patient in the ED for admission. The patient still pending a CT scan of the head, which was ordered after we were able to get in touch with the patient's niece who noted that the patient isn't a daily drinker and has been having several falls recently.  ____________________________________________   FINAL CLINICAL IMPRESSION(S) / ED DIAGNOSES  Final diagnoses:  Acute renal failure, unspecified acute renal failure type   Acute cystitis without hematuria  Sacral decubitus ulcer, stage II   chronic anemia  Carrie Mew, MD 11/03/14 0629  ----------------------------------------- 7:11 AM on 11/03/2014 -----------------------------------------  CT head unremarkable. Further care per hospitalist team.  Carrie Mew, MD 11/03/14 [phone]

## 2014-11-03 NOTE — Care Management Note (Signed)
Case Management Note  Patient Details  Name: [name] Holmes MRN: [id number] Date of Birth: [birth year]  Subjective/Objective:                  Approached by patient's niece [name] Holmes wanting to discuss discharge planning. She states patient is from home alone and "doubts she will go back to SNF". She has a four-point cane and a walker available at home. She is open to [place] for nursing. She has caregivers through [place] [facility] [place]. She goes to [facility] for PCP.   Action/Plan:  I have notified Tharon Aquas RN with [facility] [facility] of patient admission. RNCM will continue to follow.  Expected Discharge Date:  11/05/14               Expected Discharge Plan:  [place]  In-House Referral:     Discharge planning Services  CM Consult  Post Acute Care Choice:    Choice offered to:  Adult Children  DME Arranged:    DME Agency:     HH Arranged:  RN, PT [place] Agency:  [place]  Status of Service:     Medicare Important Message Given:    Date Medicare IM Given:    Medicare IM give by:    Date Additional Medicare IM Given:    Additional Medicare Important Message give by:     If discussed at [place] of Stay Meetings, dates discussed:    Additional Comments:  Marshell Garfinkel, RN 11/03/2014, 2:05 PM

## 2014-11-03 NOTE — ED Notes (Signed)
Ems pt to rm 17 from home.Pt was unable to move herself on the couch tonight due to increasing weakness over the last few days. Pt reports she has been falling for the last 6 monhs. Pt reports her niece helps her at home. Pt has strong smell of urine about her.

## 2014-11-03 NOTE — Progress Notes (Signed)
[facility] [place] NAME: [name] Holmes    MR#:  [id number]  DATE OF BIRTH:  1935-01-17  SUBJECTIVE:  CHIEF COMPLAINT:   Chief Complaint  Patient presents with  . Weakness  . Leg Pain    REVIEW OF SYSTEMS:  CONSTITUTIONAL: No fever, positive for fatigue or weakness.  EYES: No blurred or double vision.  EARS, NOSE, AND THROAT: No tinnitus or ear pain.  RESPIRATORY: No cough, shortness of breath, wheezing or hemoptysis.  CARDIOVASCULAR: No chest pain, orthopnea, edema.  GASTROINTESTINAL: No nausea, vomiting, diarrhea or abdominal pain.  GENITOURINARY: No dysuria, hematuria. Have incontinence. ENDOCRINE: No polyuria, nocturia,  HEMATOLOGY: No anemia, easy bruising or bleeding SKIN: No rash or lesion. MUSCULOSKELETAL: No joint pain or arthritis.   NEUROLOGIC: No tingling, numbness, weakness.  PSYCHIATRY: No anxiety or depression.   ROS  DRUG ALLERGIES:  No Known Allergies  VITALS:  Blood pressure 111/66, pulse 78, temperature 97.8 F (36.6 C), temperature source Oral, resp. rate 18, height 5\' 4"  (1.626 m), weight 114 lb 7 oz (51.909 kg), SpO2 100 %.  PHYSICAL EXAMINATION:  GENERAL:  80 y.o.-year-old patient lying in the bed with no acute distress.  EYES: Pupils equal, round, reactive to light and accommodation. No scleral icterus. Extraocular muscles intact.  HEENT: Head atraumatic, normocephalic. Oropharynx and nasopharynx clear. Oral mucosa apears dry. NECK:  Supple, no jugular venous distention. No thyroid enlargement, no tenderness.  LUNGS: Normal breath sounds bilaterally, no wheezing, rales,rhonchi or crepitation. No use of accessory muscles of respiration.  CARDIOVASCULAR: S1, S2 normal. No murmurs, rubs, or gallops.  ABDOMEN: Soft, nontender, nondistended. Bowel sounds present. No organomegaly or mass.  EXTREMITIES:mild pedal edema, no cyanosis, or clubbing.  NEUROLOGIC: Cranial nerves II through XII are intact.  Muscle strength 4/5 in all extremities. Sensation intact. Gait not checked.  PSYCHIATRIC: The patient is alert and oriented x 3.  SKIN: No obvious rash, lesion, or ulcer.   Physical Exam LABORATORY PANEL:   CBC  Recent Labs Lab 11/03/14 0439  WBC 9.0  HGB 7.6*  HCT 23.8*  PLT 225   ------------------------------------------------------------------------------------------------------------------  Chemistries   Recent Labs Lab 11/03/14 0439  NA 138  K 4.7  CL 102  CO2 26  GLUCOSE 89  BUN 72*  CREATININE 5.10*  CALCIUM 8.9  AST 33  ALT 17  ALKPHOS 72  BILITOT 0.6   ------------------------------------------------------------------------------------------------------------------  Cardiac Enzymes  Recent Labs Lab 11/03/14 0439  TROPONINI 0.04*   ------------------------------------------------------------------------------------------------------------------  RADIOLOGY:  Dg Chest 2 View  11/03/2014   CLINICAL DATA:  Increasing weakness, recent falls.  EXAM: CHEST  2 VIEW  COMPARISON:  Chest radiograph April 07, 2014  FINDINGS: The cardiac silhouette appears mildly enlarged, mediastinal silhouette is nonsuspicious, mildly tortuous aorta. No pleural effusion or focal consolidation. No pneumothorax. Soft tissue planes and included osseous structures are nonsuspicious. Patient is osteopenic. Moderate degenerative change of thoracic spine.  IMPRESSION: Mild cardiomegaly, no acute pulmonary process.   Electronically Signed   By: Elon Alas   On: 11/03/2014 05:23   Ct Head Wo Contrast  11/03/2014   CLINICAL DATA:  Increasing weakness for a few days, multiple falls over last 6 months.  EXAM: CT HEAD WITHOUT CONTRAST  TECHNIQUE: Contiguous axial images were obtained from the base of the skull through the vertex without intravenous contrast.  COMPARISON:  CT of the head June 06, 2014  FINDINGS: Severe ventriculomegaly, likely on the basis of global parenchymal brain  volume  loss as there is overall commensurate enlargement of cerebral sulci and cerebellar folia, unchanged. No intraparenchymal hemorrhage, mass effect, midline shift or acute large vascular territory infarct. Moderate white matter changes, similar to prior examination.  No abnormal extra-axial fluid collections. Mild calcific atherosclerosis of the carotid siphons and included vertebral arteries. Remote LEFT medial orbital blowout fracture. Ocular globes and orbital contents are otherwise unremarkable. The visualized paranasal sinuses and mastoid air cells are well aerated. No skull fracture.  IMPRESSION: No acute intracranial process.  Severe parenchymal brain volume loss. Moderate white matter changes suggest chronic small vessel ischemic disease.   Electronically Signed   By: Elon Alas   On: 11/03/2014 06:47    EKG:   Orders placed or performed during the hospital encounter of 11/03/14  . EKG 12-Lead  . EKG 12-Lead    ASSESSMENT AND PLAN:   This is a 80 year old female admitted for urinary tract infection and acute on chronic kidney disease. 1. Urinary tract infection: IV rocephin, wait for culture report. 2. Acute on chronic kidney disease:    a nephrology consult and gently hydrate the patient. At this time her electrolytes and BUN do not require dialysis. 3. Anemia: This is likely multifactorial. She has been seen by hematology in the past and has a history of upper GI bleed. She has no symptoms of upper GI bleeding at this time.  anemia of chronic disease as well as iron deficiency anemia.  4. Weakness:   likely secondary to a combination of the above factors.   will get PT eval to decide discharge plan. 5. Hypothyroidism: Check TSH. Continue Synthroid 6. Gout:  continue Uloric as well as allopurinol in renal dosing  c/o pain- will give oral steroids. 7. History of alcohol abuse:  no signs of withdrawal.  8. DVT prophylaxis: Heparin      All the records are reviewed  and case discussed with Care Management/Social Workerr. Management plans discussed with the patient, family and they are in agreement.  CODE STATUS: full  TOTAL TIME TAKING CARE OF THIS PATIENT: 35 minutes.   POSSIBLE D/C IN 1-2 DAYS, DEPENDING ON CLINICAL CONDITION.  Vaughan Basta M.D on 11/03/2014 at 7:51 PM  Between 7am to 6pm - Pager - [phone]  After 6pm go to [email or web address] - password [email or web address] [facility]  Office  [phone]  CC: Primary care physician; Marguerita Merles, MD

## 2014-11-04 LAB — CBC
HCT: 21.4 % — ABNORMAL LOW (ref 35.0–47.0)
Hemoglobin: 7 g/dL — ABNORMAL LOW (ref 12.0–16.0)
MCH: 31.8 pg (ref 26.0–34.0)
MCHC: 32.5 g/dL (ref 32.0–36.0)
MCV: 98 fL (ref 80.0–100.0)
Platelets: 201 10*3/uL (ref 150–440)
RBC: 2.18 MIL/uL — ABNORMAL LOW (ref 3.80–5.20)
RDW: 16.4 % — ABNORMAL HIGH (ref 11.5–14.5)
WBC: 7.2 10*3/uL (ref 3.6–11.0)

## 2014-11-04 LAB — BASIC METABOLIC PANEL
Anion gap: 7 (ref 5–15)
BUN: 63 mg/dL — ABNORMAL HIGH (ref 6–20)
CO2: 25 mmol/L (ref 22–32)
Calcium: 7.8 mg/dL — ABNORMAL LOW (ref 8.9–10.3)
Chloride: 104 mmol/L (ref 101–111)
Creatinine, Ser: 4.38 mg/dL — ABNORMAL HIGH (ref 0.44–1.00)
GFR calc Af Amer: 10 mL/min — ABNORMAL LOW (ref >60)
GFR calc non Af Amer: 9 mL/min — ABNORMAL LOW (ref >60)
Glucose, Bld: 76 mg/dL (ref 65–99)
Potassium: 4.2 mmol/L (ref 3.5–5.1)
Sodium: 136 mmol/L (ref 135–145)

## 2014-11-04 LAB — HEMOGLOBIN AND HEMATOCRIT, BLOOD
HCT: 28.2 % — ABNORMAL LOW (ref 35.0–47.0)
Hemoglobin: 9.3 g/dL — ABNORMAL LOW (ref 12.0–16.0)

## 2014-11-04 LAB — PREPARE RBC (CROSSMATCH)

## 2014-11-04 LAB — ABO/RH: ABO/RH(D): O POS

## 2014-11-04 MED ORDER — CEFTRIAXONE SODIUM IN DEXTROSE 20 MG/ML IV SOLN
1.0000 g | INTRAVENOUS | Status: DC
  Administered 2014-11-04 – 2014-11-05 (×2): 1 g via INTRAVENOUS
  Filled 2014-11-04 (×3): qty 50

## 2014-11-04 MED ORDER — SODIUM CHLORIDE 0.9 % IV SOLN
Freq: Once | INTRAVENOUS | Status: AC
  Administered 2014-11-04: 16:00:00 via INTRAVENOUS

## 2014-11-04 NOTE — Clinical Social Work Note (Signed)
Clinical Social Work Assessment  Patient Details  Name: [name] Holmes MRN: [id number] Date of Birth: 1935-04-27  Date of referral:  11/04/14               Reason for consult:  Facility Placement                Permission sought to share information with:  Family Supports Permission granted to share information::  Yes, Verbal Permission Granted  Name::      Astronomer::     Relationship::   Niece  Contact Information:     Housing/Transportation Living arrangements for the past 2 months:  Single Family Home Source of Information:  Patient Patient Interpreter Needed:  None Criminal Activity/Legal Involvement Pertinent to Current Situation/Hospitalization:  No - Comment as needed Significant Relationships:  Other Family Members Lives with:  Self Do you feel safe going back to the place where you live?  Yes Need for family participation in patient care:  Yes (Comment)  Care giving concerns: Patient's niece [name] is her aide and caregiver.   Social Worker assessment / plan: Holiday representative (CSW) met with patient to discuss D/C plan. PT is recommending SNF. CSW introduced self and explained role of CSW department. Patient reported that she lives alone and her caregiver is her niece [name]. CSW explained that PT is recommending SNF. Patient adamantly refused SNF and reported that she is going home. Patient reported that her caregiver [name] can come at night and during the day. CSW asked what patient's reservations were about SNF. 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 CSW made RN aware of above. CSW will continue to follow and assist as needed.   Blima Rich, LCSWA [phone]  Employment status:  Retired Nurse, adult PT Recommendations:  [place] / Referral to community resources:  Other (Comment Required) (Patient refused  SNF )  Patient/Family's Response to care:  Patient is refusing SNF and agreeable to going home.   Patient/Family's Understanding of and Emotional Response to Diagnosis, Current Treatment, and Prognosis:  Patient adamently refused SNF and reported that she is going home. Patient is open to home health. CSW provided emotional support.  Emotional Assessment Appearance:  Appears stated age Attitude/Demeanor/Rapport:    Affect (typically observed):  Apprehensive Orientation:  Oriented to Self, Oriented to Place, Oriented to  Time Alcohol / Substance use:  Not Applicable Psych involvement (Current and /or in the community):  No (Comment)  Discharge Needs  Concerns to be addressed:  Patient refuses services Readmission within the last 30 days:  No Current discharge risk:  Lives alone Barriers to Discharge:  Other (Patient is refusing SNF )   Loralyn Freshwater, LCSW 11/04/2014, 11:24 AM

## 2014-11-04 NOTE — Progress Notes (Signed)
Pr received one unit blood Refuses to get out of bed into chair with nurses and physical therapy

## 2014-11-04 NOTE — Progress Notes (Signed)
Initial Nutrition Assessment  DOCUMENTATION CODES:  INTERVENTION: Meals and Snacks: Cater to patient preferences Medical Nutrition Supplement: Mighty shakes with meals for additional nutrition  NUTRITION DIAGNOSIS:  Inadequate oral intake related to chronic illness, wound healing, inability to eat due to drinking per patient report as evidenced by 18.5% percent weight loss, energy intake < 75% for > or equal to 3 months.   GOAL: Energy Intake: Patient will meet greater than or equal to 90% of their needs with meals and supplements.   MONITOR:  PO intake, Supplement acceptance, Skin, labs, I/O's  REASON FOR ASSESSMENT:  Other (Comment), Consult Wound healing  ASSESSMENT: Reason For Admission: Admitted with acute on chronic Kidney failure PMHx: HTN Typical Fluid/ Food Intake: 80% of meals recorded per I/O Meal/ Snack Patterns: Patient reports that for the last month she has eaten regularly. She eats what her caregiver (niece) prepares for her and also eats Meals on Wheels meals delivered to her house weekly. She reports a good appetite. Prior to one month ago, and even as far back as 4-5 months ago, patient reports drinking heavily and not "eating like she should".  Supplements: None  Labs:  Electrolyte and Renal Profile:    Recent Labs Lab 11/03/14 0439 11/04/14 0531  BUN 72* 63*  CREATININE 5.10* 4.38*  NA 138 136  K 4.7 4.2   Protein Profile:   Recent Labs Lab 11/03/14 0439  ALBUMIN 2.5*    Meds: Colace, Folic Acid, B1, MVI, Vit D  UOP: Reviewed  Physical Findings: poor dentition- pt denies difficulty chewing/ swallowing; multiple pressure ulcers- stage 2 on buttocks and hips  Weight Changes: Pt reports a UBW of 140# between 3-5 months ago. She doesn't remember when she was last 140#, but she remembers that it was "cold outside". She states that she was drinking heavily and not eating and this probably caused her significant weight loss of 18.5% x 3-5  months.  Height:  Ht Readings from Last 1 Encounters:  11/03/14 5\' 4"  (1.626 m)    Weight:  Wt Readings from Last 1 Encounters:  11/03/14 114 lb 7 oz (51.909 kg)    Ideal Body Weight:     Wt Readings from Last 10 Encounters:  11/03/14 114 lb 7 oz (51.909 kg)    BMI:  Body mass index is 19.63 kg/(m^2).  Estimated Nutritional Needs:  Kcal:  2334-3568 kcal/ day (BEE: 989 x 1.2 AF x 1.0-1.2 IF)  Protein:  62-76 g Pro/ day (1.2-1.5 g Pro/ kg/ day)- to promote wound healing; monitor renal fxn with increased protein  Fluid:  1297-1557 ml/ day (25-30 ml/ kg)  Skin:  Wound (see comment) (Stage 2, hips and buttocks)  Diet Order:  Diet Heart Room service appropriate?: Yes; Fluid consistency:: Thin  EDUCATION NEEDS:  No education needs identified at this time   Intake/Output Summary (Last 24 hours) at 11/04/14 1129 Last data filed at 11/04/14 0800  Gross per 24 hour  Intake   1580 ml  Output      0 ml  Net   1580 ml    Last BM:  5/5  Roda Shutters, RDN Pager: [phone] Office: [place]

## 2014-11-04 NOTE — Progress Notes (Signed)
[facility] [place] NAME: [name] Holmes    MR#:  [id number]  DATE OF BIRTH:  1935/01/15  SUBJECTIVE:  CHIEF COMPLAINT:   Chief Complaint  Patient presents with  . Weakness  . Leg Pain    REVIEW OF SYSTEMS:  CONSTITUTIONAL: No fever, positive for fatigue and generalized weakness.  EYES: No blurred or double vision.  EARS, NOSE, AND THROAT: No tinnitus or ear pain.  RESPIRATORY: No cough, shortness of breath, wheezing or hemoptysis.  CARDIOVASCULAR: No chest pain, orthopnea, edema.  GASTROINTESTINAL: No nausea, vomiting, diarrhea or abdominal pain.  GENITOURINARY: No dysuria, hematuria. Have incontinence. ENDOCRINE: No polyuria, nocturia,  HEMATOLOGY: No anemia, easy bruising or bleeding SKIN: No rash or lesion. MUSCULOSKELETAL: No joint pain or arthritis.   NEUROLOGIC: No tingling, numbness, weakness.  PSYCHIATRY: No anxiety or depression.   ROS  DRUG ALLERGIES:  No Known Allergies  VITALS:  Blood pressure 113/67, pulse 78, temperature 97.9 F (36.6 C), temperature source Oral, resp. rate 20, height 5\' 4"  (1.626 m), weight 114 lb 7 oz (51.909 kg), SpO2 100 %.  PHYSICAL EXAMINATION:  GENERAL:  80 y.o.-year-old patient lying in the bed with no acute distress.  EYES: Pupils equal, round, reactive to light and accommodation. No scleral icterus. Extraocular muscles intact. Conjunctiva pale. HEENT: Head atraumatic, normocephalic. Oropharynx and nasopharynx clear. Oral mucosa apears dry.  NECK:  Supple, no jugular venous distention. No thyroid enlargement, no tenderness.  LUNGS: Normal breath sounds bilaterally, no wheezing, rales,rhonchi or crepitation. No use of accessory muscles of respiration.  CARDIOVASCULAR: S1, S2 normal. No murmurs, rubs, or gallops.  ABDOMEN: Soft, nontender, nondistended. Bowel sounds present. No organomegaly or mass.  EXTREMITIES:mild pedal edema, no cyanosis, or clubbing. Pulses intact in distal  arteries. NEUROLOGIC: Cranial nerves II through XII are intact. Muscle strength 4/5 in all extremities. Sensation intact. Gait not checked.  PSYCHIATRIC: The patient is alert and oriented x 3.  SKIN: No obvious rash, lesion, or ulcer.   Physical Exam LABORATORY PANEL:   CBC  Recent Labs Lab 11/04/14 0531  WBC 7.2  HGB 7.0*  HCT 21.4*  PLT 201   ------------------------------------------------------------------------------------------------------------------  Chemistries   Recent Labs Lab 11/03/14 0439 11/04/14 0531  NA 138 136  K 4.7 4.2  CL 102 104  CO2 26 25  GLUCOSE 89 76  BUN 72* 63*  CREATININE 5.10* 4.38*  CALCIUM 8.9 7.8*  AST 33  --   ALT 17  --   ALKPHOS 72  --   BILITOT 0.6  --    ------------------------------------------------------------------------------------------------------------------  Cardiac Enzymes  Recent Labs Lab 11/03/14 0439  TROPONINI 0.04*   ------------------------------------------------------------------------------------------------------------------  RADIOLOGY:  Dg Chest 2 View  11/03/2014   CLINICAL DATA:  Increasing weakness, recent falls.  EXAM: CHEST  2 VIEW  COMPARISON:  Chest radiograph April 07, 2014  FINDINGS: The cardiac silhouette appears mildly enlarged, mediastinal silhouette is nonsuspicious, mildly tortuous aorta. No pleural effusion or focal consolidation. No pneumothorax. Soft tissue planes and included osseous structures are nonsuspicious. Patient is osteopenic. Moderate degenerative change of thoracic spine.  IMPRESSION: Mild cardiomegaly, no acute pulmonary process.   Electronically Signed   By: Elon Alas   On: 11/03/2014 05:23   Ct Head Wo Contrast  11/03/2014   CLINICAL DATA:  Increasing weakness for a few days, multiple falls over last 6 months.  EXAM: CT HEAD WITHOUT CONTRAST  TECHNIQUE: Contiguous axial images were obtained from the base of the skull through  the vertex without intravenous contrast.   COMPARISON:  CT of the head June 06, 2014  FINDINGS: Severe ventriculomegaly, likely on the basis of global parenchymal brain volume loss as there is overall commensurate enlargement of cerebral sulci and cerebellar folia, unchanged. No intraparenchymal hemorrhage, mass effect, midline shift or acute large vascular territory infarct. Moderate white matter changes, similar to prior examination.  No abnormal extra-axial fluid collections. Mild calcific atherosclerosis of the carotid siphons and included vertebral arteries. Remote LEFT medial orbital blowout fracture. Ocular globes and orbital contents are otherwise unremarkable. The visualized paranasal sinuses and mastoid air cells are well aerated. No skull fracture.  IMPRESSION: No acute intracranial process.  Severe parenchymal brain volume loss. Moderate white matter changes suggest chronic small vessel ischemic disease.   Electronically Signed   By: Elon Alas   On: 11/03/2014 06:47    EKG:   Orders placed or performed during the hospital encounter of 11/03/14  . EKG 12-Lead  . EKG 12-Lead    ASSESSMENT AND PLAN:   This is a 80 year old female admitted for urinary tract infection and acute on chronic kidney disease. 1. Urinary tract infection: IV rocephin, wait for culture report. It grew 100K CFU GNR. 2. Acute on chronic kidney disease:    a nephrology consult and gently hydrate the patient. At this time her electrolytes and BUN do not require dialysis. 3. Anemia: This is likely multifactorial. She has been seen by hematology in the past and has a history of upper GI bleed. She has no symptoms of upper GI bleeding at this time.  anemia of chronic disease as well as iron deficiency anemia.   a component of dilution, as pt is feeling weak and Hb -7- will transfuse one unit today. 4. Weakness:   likely secondary to a combination of the above factors.   PT suggested SNF- rehab, but pt refuse for that. 5. Hypothyroidism: Check TSH.  Continue Synthroid 6. Gout:  continue Uloric as well as allopurinol in renal dosing  c/o pain- will give oral steroids. 7. History of alcohol abuse:  no signs of withdrawal.  8. DVT prophylaxis: Heparin   All the records are reviewed and case discussed with Care Management/Social Workerr. Management plans discussed with the patient, family and they are in agreement.  CODE STATUS: full  TOTAL TIME TAKING CARE OF THIS PATIENT: 35 minutes.   POSSIBLE D/C IN 1-2 DAYS, DEPENDING ON CLINICAL CONDITION.  Vaughan Basta M.D on 11/04/2014 at 3:49 PM  Between 7am to 6pm - Pager - [phone]  After 6pm go to [email or web address] - password [email or web address] [facility]  Office  [phone]  CC: Primary care physician; Marguerita Merles, MD

## 2014-11-04 NOTE — Progress Notes (Signed)
Pt is alert and oriented. VSS. Pain improved with PO pain medication on MAR. IVF infusing. Sleeping between care, will continue to monitor.

## 2014-11-04 NOTE — Consult Note (Signed)
WOC wound consult note Reason for Consult: Stage II pressure ulcers to bilateral buttocks and right upper thigh.   Wound type: Stage II pressure ulcers, present on admission.  Pressure Ulcer POA: Yes Measurement:Right upper thigh 2 cm x 1.5 cm x 0.1 cm Right buttocks 3.5 cm x 3 cm x 0.1 cm Left buttocks:   5 cm x 4.5 cm x 0.1 cm Wound bed: 100% pink and moist.  Drainage (amount, consistency, odor) Minimal serous weeping Periwound:Intact Dressing procedure/placement/frequency:Cleanse wounds to right thigh and buttocks with soap and water and pat gently dry.  Apply Allevyn silicone border foam dressing.  Change every 3 days and PRN soilage.  Will not follow at this time.  Please re-consult if needed.  Domenic Moras RN BSN [place] Pager [phone]

## 2014-11-04 NOTE — Progress Notes (Signed)
[facility]  ROUNDING NOTE   Subjective:   Feeling better. Wants to go home.  hgb 7   Objective:  Vital signs in last 24 hours:  Temp:  [97.5 F (36.4 C)-97.8 F (36.6 C)] 97.5 F (36.4 C) (05/06 0858) Pulse Rate:  [71-78] 71 (05/06 0858) Resp:  [18-20] 20 (05/06 0858) BP: (93-137)/(53-93) 137/93 mmHg (05/06 0858) SpO2:  [96 %-100 %] 98 % (05/06 0858)  Weight change:  Filed Weights   11/03/14 0411  Weight: 51.909 kg (114 lb 7 oz)    Intake/Output: I/O last 3 completed shifts: In: 1340 [I.V.:1290; IV Piggyback:50] Out: 25 [Urine:25]   Intake/Output this shift:  Total I/O In: 240 [P.O.:240] Out: -   Physical Exam: General: NAD,   Head: Normocephalic, atraumatic. Dry mucosal membranes  Eyes: Anicteric, PERRL  Neck: Supple, trachea midline  Lungs:  Clear to auscultation  Heart: Regular rate and rhythm  Abdomen:  Soft, nontender,   Extremities: no peripheral edema.  Neurologic: Nonfocal, moving all four extremities, alert and oriented.   Skin: No lesions       Basic Metabolic Panel:  Recent Labs Lab 11/03/14 0439 11/04/14 0531  NA 138 136  K 4.7 4.2  CL 102 104  CO2 26 25  GLUCOSE 89 76  BUN 72* 63*  CREATININE 5.10* 4.38*  CALCIUM 8.9 7.8*    Liver Function Tests:  Recent Labs Lab 11/03/14 0439  AST 33  ALT 17  ALKPHOS 72  BILITOT 0.6  PROT 7.0  ALBUMIN 2.5*    Recent Labs Lab 11/03/14 0439  LIPASE 28   No results for input(s): AMMONIA in the last 168 hours.  CBC:  Recent Labs Lab 11/03/14 0439 11/04/14 0531  WBC 9.0 7.2  NEUTROABS 6.9*  --   HGB 7.6* 7.0*  HCT 23.8* 21.4*  MCV 97.6 98.0  PLT 225 201    Cardiac Enzymes:  Recent Labs Lab 11/03/14 0439  TROPONINI 0.04*    BNP: Invalid input(s): POCBNP  CBG: No results for input(s): GLUCAP in the last 168 hours.  Microbiology: Results for orders placed or performed during the hospital encounter of 11/03/14  Urine culture     Status: None  (Preliminary result)   Collection Time: 11/03/14  4:39 AM  Result Value Ref Range Status   Specimen Description URINE, CATHETERIZED  Final   Special Requests NONE  Final   Culture   Final    >=100,000 COLONIES/mL GRAM NEGATIVE RODS IDENTIFICATION TO FOLLOW SUSCEPTIBILITIES TO FOLLOW    Report Status PENDING  Incomplete    Coagulation Studies: No results for input(s): LABPROT, INR in the last 72 hours.  Urinalysis:  Recent Labs  11/03/14 0439  COLORURINE YELLOW*  LABSPEC 1.011  PHURINE 6.0  GLUCOSEU NEGATIVE  HGBUR 2+*  BILIRUBINUR NEGATIVE  KETONESUR NEGATIVE  PROTEINUR 100*  NITRITE NEGATIVE  LEUKOCYTESUR 3+*      Imaging: Dg Chest 2 View  11/03/2014   CLINICAL DATA:  Increasing weakness, recent falls.  EXAM: CHEST  2 VIEW  COMPARISON:  Chest radiograph April 07, 2014  FINDINGS: The cardiac silhouette appears mildly enlarged, mediastinal silhouette is nonsuspicious, mildly tortuous aorta. No pleural effusion or focal consolidation. No pneumothorax. Soft tissue planes and included osseous structures are nonsuspicious. Patient is osteopenic. Moderate degenerative change of thoracic spine.  IMPRESSION: Mild cardiomegaly, no acute pulmonary process.   Electronically Signed   By: Elon Alas   On: 11/03/2014 05:23   Ct Head Wo Contrast  11/03/2014  CLINICAL DATA:  Increasing weakness for a few days, multiple falls over last 6 months.  EXAM: CT HEAD WITHOUT CONTRAST  TECHNIQUE: Contiguous axial images were obtained from the base of the skull through the vertex without intravenous contrast.  COMPARISON:  CT of the head June 06, 2014  FINDINGS: Severe ventriculomegaly, likely on the basis of global parenchymal brain volume loss as there is overall commensurate enlargement of cerebral sulci and cerebellar folia, unchanged. No intraparenchymal hemorrhage, mass effect, midline shift or acute large vascular territory infarct. Moderate white matter changes, similar to prior  examination.  No abnormal extra-axial fluid collections. Mild calcific atherosclerosis of the carotid siphons and included vertebral arteries. Remote LEFT medial orbital blowout fracture. Ocular globes and orbital contents are otherwise unremarkable. The visualized paranasal sinuses and mastoid air cells are well aerated. No skull fracture.  IMPRESSION: No acute intracranial process.  Severe parenchymal brain volume loss. Moderate white matter changes suggest chronic small vessel ischemic disease.   Electronically Signed   By: Elon Alas   On: 11/03/2014 06:47     Medications:   . sodium chloride 75 mL/hr at 11/03/14 2234   . cefTRIAXone (ROCEPHIN)  IV  1 g Intravenous Q24H  . cholecalciferol  1,000 Units Oral Daily  . febuxostat  40 mg Oral Daily  . ferrous sulfate  325 mg Oral Q breakfast  . folic acid  1 mg Oral Daily  . heparin  5,000 Units Subcutaneous 3 times per day  . levothyroxine  50 mcg Oral QAC breakfast  . multivitamin with minerals  1 tablet Oral Daily  . oxybutynin  5 mg Oral QHS  . pantoprazole  40 mg Oral Daily  . polyethylene glycol  17 g Oral Daily  . predniSONE  20 mg Oral Q breakfast  . thiamine  100 mg Oral Daily   acetaminophen, docusate sodium  Assessment/ Plan:  80 y.o. black female with chronic alcoholism, anemia, gout, hypertension admitted on 11/03/2014 For urinary tract infection and acute renal failure on chronic kidney disease stage III.   1. Acute renal failure on chronic kidney disease stage III with proteinuria: history suggestive of prerenal azotemia. Agree with IV fluids. Renally dose all medications. Monitor urine output. No acute indication for dialysis at this time. Creatinine continues to improve.   2. Urinary Tract Infection: empiric coverage with ceftriaxone. Urine cultures pending.   3. Anemia: hemoglobin 7.  With history of GI bleed and alcohol abuse.  - transfusion as per primary team.    LOS: 1 Sunny Gains 5/6/201611:07  AM

## 2014-11-04 NOTE — Progress Notes (Signed)
Physical Therapy Treatment Patient Details Name: [name] Holmes MRN: [id number] DOB: [birth year] Today's Date: 11/04/2014    History of Present Illness 80 yo female with onset of UTI and cardiomegaly with acute chronic kidney disease and elevated troponin was admitted for elevated creatinine.  PMHx:  OA, HTN, anemia, gout, alcohol abuse    PT Comments    Pt is scheduled to go home per her request and PT did spend some time today asking her about this plan.  Declined to get up to bedside today and did talk with her aobut refusing HHPT and inpt therapy.  However, her niece is supportive of the decision to go  Home.  Will continue to try to get her up to chair and to take steps.  Follow Up Recommendations  SNF     Equipment Recommendations  None recommended by PT    Recommendations for Other Services       Precautions / Restrictions Precautions Precautions: Fall Precaution Comments: Pt is cold from room air, may be unusual for her to have AC Restrictions Weight Bearing Restrictions: No    Mobility  Bed Mobility Overal bed mobility: Needs Assistance Bed Mobility: Rolling Rolling: Mod assist            Transfers                 General transfer comment: refused  Ambulation/Gait                 Stairs            Wheelchair Mobility    Modified Rankin (Stroke Patients Only)       Balance                                    Cognition Arousal/Alertness: Awake/alert Behavior During Therapy: WFL for tasks assessed/performed Overall Cognitive Status: Impaired/Different from baseline Area of Impairment: Memory;Following commands;Safety/judgement;Awareness;Problem solving   Current Attention Level: Alternating Memory: Decreased recall of precautions;Decreased short-term memory Following Commands: Follows one step commands inconsistently Safety/Judgement: Decreased awareness of safety;Decreased awareness of deficits Awareness:  Intellectual Problem Solving: Slow processing;Decreased initiation;Requires tactile cues General Comments: Pt was not willing to consider that her refusal to get OOB and having HHPT to follow up were issues with her expected progress    Exercises General Exercises - Lower Extremity Ankle Circles/Pumps: AROM;AAROM;Both;5 reps Quad Sets: AROM;Both;15 reps Gluteal Sets: AROM;Both;15 reps Heel Slides: AROM;AAROM;Both;20 reps Hip ABduction/ADduction: AROM;AAROM;Both;20 reps Straight Leg Raises: AROM;Both;5 reps    General Comments General comments (skin integrity, edema, etc.): Pt is declining SNF for home and won't get up today to bedside.  Have spent some time educating her about expectations with this approach but she is not willing to reconsider.      Pertinent Vitals/Pain Pain Assessment: Faces Faces Pain Scale: Hurts even more Pain Location: B ankles with gout Pain Descriptors / Indicators: Sharp;Sore Pain Intervention(s): Limited activity within patient's tolerance;Monitored during session;Repositioned    Home Living                      Prior Function            PT Goals (current goals can now be found in the care plan section) Acute Rehab PT Goals Patient Stated Goal: Home with daughter Progress towards PT goals: Not progressing toward goals - comment    Frequency  Min 2X/week  PT Plan Current plan remains appropriate    Co-evaluation             End of Session   Activity Tolerance: Patient limited by pain (in ankles) Patient left: in bed;with call bell/phone within reach;with bed alarm set     Time: 1455-1525 PT Time Calculation (min) (ACUTE ONLY): 30 min  Charges:  $Therapeutic Exercise: 8-22 mins $Therapeutic Activity: 8-22 mins                    G Codes:      Ramond Dial 01-Dec-2014, 3:48 PM   Mee Hives, PT MS Acute Rehab Dept. Number: [phone]

## 2014-11-04 NOTE — Progress Notes (Addendum)
Clinical Social Worker (CSW) discussed case with MD. Per MD patient has changed her mind and is willing to go to rehab. Per MD patient will likely D/C this weekend. CSW met with patient again to discuss D/C plan. Patient continued to adamantly refuse SNF. Patient reported that she has a walker at home and can get to the bathroom.   CSW contacted patient's niece [name] to discuss D/C plan. Per [name] patient is open to [facility]. An RN from [facility] gives the patient her medicine. Per niece patient also has caregivers though touched by [facility]. CSW made niece aware that patient is refusing SNF. Niece is in agreement with patient coming home. Per niece she will be out of town on Saturday but can be reached via cell at [phone]. Niece reported that she is the only family member with a key to patient's house she would prefer that patient D/C Sunday. CSW made niece aware that MD will decide when patient is medically stable and will possible D/C Saturday or Sunday. CSW will continue to follow and assist as needed.   Blima Rich, [place] [phone]

## 2014-11-05 LAB — CBC
HCT: 23.7 % — ABNORMAL LOW (ref 35.0–47.0)
Hemoglobin: 7.8 g/dL — ABNORMAL LOW (ref 12.0–16.0)
MCH: 30.9 pg (ref 26.0–34.0)
MCHC: 32.9 g/dL (ref 32.0–36.0)
MCV: 93.7 fL (ref 80.0–100.0)
PLATELETS: 187 10*3/uL (ref 150–440)
RBC: 2.52 MIL/uL — ABNORMAL LOW (ref 3.80–5.20)
RDW: 17.1 % — ABNORMAL HIGH (ref 11.5–14.5)
WBC: 7.1 10*3/uL (ref 3.6–11.0)

## 2014-11-05 LAB — BASIC METABOLIC PANEL
Anion gap: 5 (ref 5–15)
BUN: 57 mg/dL — AB (ref 6–20)
CO2: 22 mmol/L (ref 22–32)
Calcium: 7.5 mg/dL — ABNORMAL LOW (ref 8.9–10.3)
Chloride: 108 mmol/L (ref 101–111)
Creatinine, Ser: 3.8 mg/dL — ABNORMAL HIGH (ref 0.44–1.00)
GFR calc Af Amer: 12 mL/min — ABNORMAL LOW (ref >60)
GFR, EST NON AFRICAN AMERICAN: 11 mL/min — AB (ref >60)
Glucose, Bld: 112 mg/dL — ABNORMAL HIGH (ref 65–99)
POTASSIUM: 4.1 mmol/L (ref 3.5–5.1)
Sodium: 135 mmol/L (ref 135–145)

## 2014-11-05 LAB — URINE CULTURE

## 2014-11-05 LAB — CLOSTRIDIUM DIFFICILE BY PCR: Toxigenic C. Difficile by PCR: NEGATIVE

## 2014-11-05 LAB — C DIFFICILE QUICK SCREEN W PCR REFLEX
C Diff antigen: POSITIVE
C Diff toxin: NEGATIVE

## 2014-11-05 NOTE — Progress Notes (Signed)
[facility] NAME: [name] Holmes    MR#:  [id number]  DATE OF BIRTH:  30-Dec-1934  SUBJECTIVE:  CHIEF COMPLAINT:   Chief Complaint  Patient presents with  . Weakness  . Leg Pain  Did not get up with PT yesterday.  REVIEW OF SYSTEMS:  CONSTITUTIONAL: No fever, positive for fatigue and generalized weakness.  EYES: No blurred or double vision.  EARS, NOSE, AND THROAT: No tinnitus or ear pain.  RESPIRATORY: No cough, shortness of breath, wheezing or hemoptysis.  CARDIOVASCULAR: No chest pain, orthopnea, edema.  GASTROINTESTINAL: No nausea, vomiting, diarrhea or abdominal pain.  GENITOURINARY: No dysuria, hematuria. Have incontinence. ENDOCRINE: No polyuria, nocturia,  HEMATOLOGY: No anemia, easy bruising or bleeding SKIN: No rash or lesion. MUSCULOSKELETAL: No joint pain or arthritis.   NEUROLOGIC: No tingling, numbness, weakness.  PSYCHIATRY: No anxiety or depression.   ROS  DRUG ALLERGIES:  No Known Allergies  VITALS:  Blood pressure 114/72, pulse 78, temperature 98.1 F (36.7 C), temperature source Oral, resp. rate 18, height 5\' 4"  (1.626 m), weight 51.909 kg (114 lb 7 oz), SpO2 100 %.  PHYSICAL EXAMINATION:  GENERAL:  80 y.o.-year-old patient lying in the bed with no acute distress.  EYES: Pupils equal, round, reactive to light and accommodation. No scleral icterus. Extraocular muscles intact. Conjunctiva pale. HEENT: Head atraumatic, normocephalic. Oropharynx and nasopharynx clear. Oral mucosa apears dry.  NECK:  Supple, no jugular venous distention. No thyroid enlargement, no tenderness.  LUNGS: Normal breath sounds bilaterally, no wheezing, rales,rhonchi or crepitation. No use of accessory muscles of respiration.  CARDIOVASCULAR: S1, S2 normal. No murmurs, rubs, or gallops.  ABDOMEN: Soft, nontender, nondistended. Bowel sounds present. No organomegaly or mass.  EXTREMITIES:mild pedal edema, no cyanosis, or  clubbing. Pulses intact in distal arteries. NEUROLOGIC: Cranial nerves II through XII are intact. Muscle strength 4/5 in all extremities. Sensation intact. Gait not checked.  PSYCHIATRIC: The patient is alert and oriented x 3.  SKIN: No obvious rash, lesion, or ulcer.   Physical Exam LABORATORY PANEL:   CBC  Recent Labs Lab 11/05/14 0435  WBC 7.1  HGB 7.8*  HCT 23.7*  PLT 187   ------------------------------------------------------------------------------------------------------------------  Chemistries   Recent Labs Lab 11/03/14 0439  11/05/14 0435  NA 138  < > 135  K 4.7  < > 4.1  CL 102  < > 108  CO2 26  < > 22  GLUCOSE 89  < > 112*  BUN 72*  < > 57*  CREATININE 5.10*  < > 3.80*  CALCIUM 8.9  < > 7.5*  AST 33  --   --   ALT 17  --   --   ALKPHOS 72  --   --   BILITOT 0.6  --   --   < > = values in this interval not displayed. ------------------------------------------------------------------------------------------------------------------  Cardiac Enzymes  Recent Labs Lab 11/03/14 0439  TROPONINI 0.04*   ------------------------------------------------------------------------------------------------------------------  RADIOLOGY:  No results found.  EKG:   Orders placed or performed during the hospital encounter of 11/03/14  . EKG 12-Lead  . EKG 12-Lead    ASSESSMENT AND PLAN:   This is a 80 year old female admitted for urinary tract infection and acute on chronic kidney disease. 1. Urinary tract infection: IV rocephin, wait for final culture report. It grew 100K CFU GNR. 2. Acute on chronic kidney disease:    a nephrology consult and gently hydrate the patient. At this time do not  require dialysis.   Slow gradual improvement in creatinin. 3. Anemia: This is likely multifactorial. She has been seen by hematology in the past and has a history of upper GI bleed. She has no symptoms of upper GI bleeding at this time.  anemia of chronic disease as  well as iron deficiency anemia.   a component of dilution, as pt is feeling weak and Hb -7- transfused one unit 11/04/14. 4. Weakness:   likely secondary to a combination of the above factors.   PT suggested SNF- rehab, but pt refuse for that.  will have to d/c Home with [facility] in next 1-2 days. 5. Hypothyroidism: normal TSH. Continue Synthroid 6. Gout:  continue Uloric as well as allopurinol in renal dosing  c/o pain- oral steroids. 7. History of alcohol abuse:  no signs of withdrawal.  8. DVT prophylaxis: Heparin   All the records are reviewed and case discussed with Care Management/Social Workerr. Management plans discussed with the patient, family and they are in agreement.  CODE STATUS: full  TOTAL TIME TAKING CARE OF THIS PATIENT: 35 minutes.   POSSIBLE D/C IN 1-2 DAYS, DEPENDING ON CLINICAL CONDITION.   Waiting for final urine cx, and improvement in renal function to safe range.  Vaughan Basta M.D on 11/05/2014 at 12:29 PM  Between 7am to 6pm - Pager - [phone]  After 6pm go to [email or web address] - password [email or web address] [facility]  Office  [phone]  CC: Primary care physician; Marguerita Merles, MD

## 2014-11-05 NOTE — Progress Notes (Signed)
Pt was two assist up to bathroom pt had 1 loose stools green spec sent to lab to rule out c diff which was negative  When family came to visit pt sat on side of bed and ate dinned otherwise pt lies in bed and is incontinent bowel and bladder

## 2014-11-05 NOTE — Progress Notes (Signed)
[facility]  ROUNDING NOTE   Subjective:   Patient laying in bed. Finished breakfast. States she is feeling better. Complains that her feet are cold.  Urine output not being recorded.  1 unit transfusion yesterday. PRBC  Objective:  Vital signs in last 24 hours:  Temp:  [97.9 F (36.6 C)-98.3 F (36.8 C)] 98.1 F (36.7 C) (05/07 0803) Pulse Rate:  [77-89] 78 (05/07 0803) Resp:  [18-20] 18 (05/07 0803) BP: (114-138)/(70-85) 114/72 mmHg (05/07 0803) SpO2:  [98 %-100 %] 100 % (05/07 0803)  Weight change:  Filed Weights   11/03/14 0411  Weight: 51.909 kg (114 lb 7 oz)    Intake/Output: I/O last 3 completed shifts: In: 2752.9 [P.O.:360; I.V.:2030.9; Blood:262; IV Piggyback:100] Out: -    Intake/Output this shift:     Physical Exam: General: NAD,   Head: Normocephalic, atraumatic. Dry mucosal membranes  Eyes: Anicteric, PERRL  Neck: Supple, trachea midline  Lungs:  Clear to auscultation  Heart: Regular rate and rhythm  Abdomen:  Soft, nontender,   Extremities: no peripheral edema.  Neurologic: Nonfocal, moving all four extremities, alert and oriented.   Skin: No lesions       Basic Metabolic Panel:  Recent Labs Lab 11/03/14 0439 11/04/14 0531 11/05/14 0435  NA 138 136 135  K 4.7 4.2 4.1  CL 102 104 108  CO2 26 25 22   GLUCOSE 89 76 112*  BUN 72* 63* 57*  CREATININE 5.10* 4.38* 3.80*  CALCIUM 8.9 7.8* 7.5*    Liver Function Tests:  Recent Labs Lab 11/03/14 0439  AST 33  ALT 17  ALKPHOS 72  BILITOT 0.6  PROT 7.0  ALBUMIN 2.5*    Recent Labs Lab 11/03/14 0439  LIPASE 28   No results for input(s): AMMONIA in the last 168 hours.  CBC:  Recent Labs Lab 11/03/14 0439 11/04/14 0531 11/04/14 1951 11/05/14 0435  WBC 9.0 7.2  --  7.1  NEUTROABS 6.9*  --   --   --   HGB 7.6* 7.0* 9.3* 7.8*  HCT 23.8* 21.4* 28.2* 23.7*  MCV 97.6 98.0  --  93.7  PLT 225 201  --  187    Cardiac Enzymes:  Recent Labs Lab 11/03/14 0439   TROPONINI 0.04*    BNP: Invalid input(s): POCBNP  CBG: No results for input(s): GLUCAP in the last 168 hours.  Microbiology: Results for orders placed or performed during the hospital encounter of 11/03/14  Urine culture     Status: None (Preliminary result)   Collection Time: 11/03/14  4:39 AM  Result Value Ref Range Status   Specimen Description URINE, CATHETERIZED  Final   Special Requests NONE  Final   Culture   Final    >=100,000 COLONIES/mL GRAM NEGATIVE RODS IDENTIFICATION TO FOLLOW SUSCEPTIBILITIES TO FOLLOW    Report Status PENDING  Incomplete    Coagulation Studies: No results for input(s): LABPROT, INR in the last 72 hours.  Urinalysis:  Recent Labs  11/03/14 0439  COLORURINE YELLOW*  LABSPEC 1.011  PHURINE 6.0  GLUCOSEU NEGATIVE  HGBUR 2+*  BILIRUBINUR NEGATIVE  KETONESUR NEGATIVE  PROTEINUR 100*  NITRITE NEGATIVE  LEUKOCYTESUR 3+*      Imaging: No results found.   Medications:   . sodium chloride 75 mL/hr at 11/05/14 0435   . cefTRIAXone (ROCEPHIN)  IV  1 g Intravenous Q24H  . cholecalciferol  1,000 Units Oral Daily  . febuxostat  40 mg Oral Daily  . ferrous sulfate  325 mg Oral  Q breakfast  . folic acid  1 mg Oral Daily  . heparin  5,000 Units Subcutaneous 3 times per day  . levothyroxine  50 mcg Oral QAC breakfast  . multivitamin with minerals  1 tablet Oral Daily  . oxybutynin  5 mg Oral QHS  . pantoprazole  40 mg Oral Daily  . polyethylene glycol  17 g Oral Daily  . predniSONE  20 mg Oral Q breakfast  . thiamine  100 mg Oral Daily   acetaminophen, docusate sodium  Assessment/ Plan:  80 y.o. black female with chronic alcoholism, anemia, gout, hypertension admitted on 11/03/2014 For urinary tract infection and acute renal failure on chronic kidney disease stage III.   1. Acute renal failure on chronic kidney disease stage III with proteinuria: history suggestive of prerenal azotemia.  Agree with IV fluids. Currently NS at 45mL/hr   Renally dose all medications. Monitor urine output. No acute indication for dialysis at this time. Creatinine continues to improve.   2. Urinary Tract Infection: empiric coverage with ceftriaxone. Urine cultures pending. Gram negative rods  3. Anemia: hemoglobin 7.8. Status post 1 unit transfusion PRBC on 5/6.  With history of GI bleed and alcohol abuse.  - Continue to monitor hemoglobin.  - Consider GI consult - low threshold for epo.   4. Pain in feet. Refusing to work with PT. Claudication? May need vascular input.    LOS: 2 [name] Holmes 5/7/20169:27 AM

## 2014-11-06 LAB — RENAL FUNCTION PANEL
Albumin: 1.9 g/dL — ABNORMAL LOW (ref 3.5–5.0)
Anion gap: 4 — ABNORMAL LOW (ref 5–15)
BUN: 52 mg/dL — AB (ref 6–20)
CO2: 21 mmol/L — ABNORMAL LOW (ref 22–32)
CREATININE: 3.39 mg/dL — AB (ref 0.44–1.00)
Calcium: 7.5 mg/dL — ABNORMAL LOW (ref 8.9–10.3)
Chloride: 108 mmol/L (ref 101–111)
GFR calc Af Amer: 14 mL/min — ABNORMAL LOW (ref >60)
GFR calc non Af Amer: 12 mL/min — ABNORMAL LOW (ref >60)
Glucose, Bld: 122 mg/dL — ABNORMAL HIGH (ref 65–99)
PHOSPHORUS: 2.9 mg/dL (ref 2.5–4.6)
Potassium: 3.9 mmol/L (ref 3.5–5.1)
Sodium: 133 mmol/L — ABNORMAL LOW (ref 135–145)

## 2014-11-06 LAB — CBC
HCT: 22.6 % — ABNORMAL LOW (ref 35.0–47.0)
HEMOGLOBIN: 7.4 g/dL — AB (ref 12.0–16.0)
MCH: 31.1 pg (ref 26.0–34.0)
MCHC: 32.5 g/dL (ref 32.0–36.0)
MCV: 95.7 fL (ref 80.0–100.0)
Platelets: 187 10*3/uL (ref 150–440)
RBC: 2.37 MIL/uL — AB (ref 3.80–5.20)
RDW: 17.7 % — ABNORMAL HIGH (ref 11.5–14.5)
WBC: 7.2 10*3/uL (ref 3.6–11.0)

## 2014-11-06 MED ORDER — CEFTRIAXONE SODIUM IN DEXTROSE 20 MG/ML IV SOLN
1.0000 g | INTRAVENOUS | Status: DC
  Administered 2014-11-06 – 2014-11-07 (×2): 1 g via INTRAVENOUS
  Filled 2014-11-06 (×3): qty 50

## 2014-11-06 MED ORDER — CEFTRIAXONE SODIUM IN DEXTROSE 20 MG/ML IV SOLN
1.0000 g | INTRAVENOUS | Status: DC
  Filled 2014-11-06 (×2): qty 50

## 2014-11-06 MED ORDER — SODIUM CHLORIDE 0.9 % IV SOLN
INTRAVENOUS | Status: DC
  Administered 2014-11-08 (×2): via INTRAVENOUS

## 2014-11-06 NOTE — Consult Note (Signed)
GI Inpatient Consult Note Lollie Sails MD  Reason for Consult: Anemia   Attending Requesting Consult: Marthann Schiller  Outpatient Primary Physician:   History of Present Illness: [name] Holmes is a 80 y.o. female who was brought to the emergency room with weakness. She is a difficult historian. He states that he has had weakness, and go quite a long time. On laboratory evaluation on admission she was found to be anemic however normocytic. She has an ongoing history of alcohol abuse and chronic renal insufficiency.  She denies any nausea or vomiting. There has been no heartburn or dysphagia. She does take an NSAID, Advil, a couple of times a month. She states she has seen some occasional black bowel movements. She denies any red blood in the stools. Her main complaint is with her gout and lower extremity pain.  I saw her in January 2013 at that time. EGD for hematemesis and melena on 07/03/2011 at the finding of gastritis and multiple gastric ulcers likely NSAID related. He also had a Schatzki ring noted at the time that was dilated on passage of the scope. There is no record of colonoscopy at [facility] patient states she has had a colonoscopy at [facility]. I have been unable to access that.   She was transfused after admission with a drop of hemoglobin noted subsequent to the transfusion. There is been no evidence of acute bleeding.    Past Medical History:  Past Medical History  Diagnosis Date  . Arthritis   . Hypertension   . Anemia   . Gout     Problem List: Patient Active Problem List   Diagnosis Date Noted  . Acute on chronic kidney failure 11/03/2014  . UTI (lower urinary tract infection) 11/03/2014  . Anemia     Past Surgical History: Past Surgical History  Procedure Laterality Date  . Abdominal hysterectomy      Allergies: No Known Allergies  Home Medications: Prescriptions prior to admission  Medication Sig Dispense Refill Last Dose  .  acetaminophen (TYLENOL) 325 MG tablet Take 650 mg by mouth every 6 (six) hours as needed.     Marland Kitchen allopurinol (ZYLOPRIM) 100 MG tablet Take 100 mg by mouth daily.     . cholecalciferol (VITAMIN D) 1000 UNITS tablet Take 1,000 Units by mouth daily.     Marland Kitchen docusate sodium (COLACE) 100 MG capsule Take 100 mg by mouth daily as needed for mild constipation (at night).     . febuxostat (ULORIC) 40 MG tablet Take 40 mg by mouth daily.     . ferrous sulfate 325 (65 FE) MG tablet Take 325 mg by mouth daily with breakfast.     . folic acid (FOLVITE) 1 MG tablet Take 1 mg by mouth daily.     . furosemide (LASIX) 40 MG tablet Take 40 mg by mouth daily.     Marland Kitchen levothyroxine (SYNTHROID, LEVOTHROID) 50 MCG tablet Take 50 mcg by mouth daily before breakfast.     . loperamide (IMODIUM A-D) 2 MG tablet Take 2 mg by mouth 4 (four) times daily as needed for diarrhea or loose stools.     . magnesium oxide (MAG-OX) 400 MG tablet Take 400 mg by mouth daily.     Marland Kitchen omeprazole (PRILOSEC) 20 MG capsule Take 20 mg by mouth 2 (two) times daily before a meal.     . oxybutynin (DITROPAN-XL) 5 MG 24 hr tablet Take 5 mg by mouth at bedtime.     Marland Kitchen  polyethylene glycol (MIRALAX / GLYCOLAX) packet Take 17 g by mouth daily.     . potassium chloride (K-DUR) 10 MEQ tablet Take 10 mEq by mouth daily.     Marland Kitchen thiamine 100 MG tablet Take 100 mg by mouth daily.      Home medication reconciliation was completed with the patient.   Scheduled Inpatient Medications:   . cefTRIAXone (ROCEPHIN)  IV  1 g Intravenous Q24H  . cholecalciferol  1,000 Units Oral Daily  . febuxostat  40 mg Oral Daily  . ferrous sulfate  325 mg Oral Q breakfast  . folic acid  1 mg Oral Daily  . heparin  5,000 Units Subcutaneous 3 times per day  . levothyroxine  50 mcg Oral QAC breakfast  . multivitamin with minerals  1 tablet Oral Daily  . oxybutynin  5 mg Oral QHS  . pantoprazole  40 mg Oral Daily  . polyethylene glycol  17 g Oral Daily  . predniSONE  20 mg Oral  Q breakfast  . thiamine  100 mg Oral Daily    Continuous Inpatient Infusions:   . sodium chloride 20 mL/hr at 11/06/14 2008    PRN Inpatient Medications:  acetaminophen, docusate sodium  Family History: family history includes Hypertension in her father.   GI Family History: Negative for colorectal cancer or liver disease or ulcers  Social History:   reports that she has quit smoking. She does not have any smokeless tobacco history on file. She reports that she drinks about 10.8 oz of alcohol per week.   ROS  Review of Systems: 10 systems reviewed per admission history and physical agree with same  Physical Examination: BP 114/69 mmHg  Pulse 79  Temp(Src) 98.8 F (37.1 C) (Oral)  Resp 16  Ht 5\' 4"  (1.626 m)  Wt 51.909 kg (114 lb 7 oz)  BMI 19.63 kg/m2  SpO2 100% Gen: Elderly Afro-American female no acute distress HEENT: Normocephalic atraumatic eyes are anicteric. Dentition poor. Neck: No JVD Chest: Clear to auscultation CV: Rate and rhythm Abd: Abdomen soft nontender nondistended bowel sounds positive normoactive lower midline incision consistent with hysterectomy remote Ext: No clubbing or cyanosis or edema. Skin: Other: Digital rectal examination Hemoccult negative  Data: Lab Results  Component Value Date   WBC 7.2 11/06/2014   HGB 7.4* 11/06/2014   HCT 22.6* 11/06/2014   MCV 95.7 11/06/2014   PLT 187 11/06/2014    Recent Labs Lab 11/04/14 1951 11/05/14 0435 11/06/14 0433  HGB 9.3* 7.8* 7.4*   Lab Results  Component Value Date   NA 133* 11/06/2014   K 3.9 11/06/2014   CL 108 11/06/2014   CO2 21* 11/06/2014   BUN 52* 11/06/2014   CREATININE 3.39* 11/06/2014   Lab Results  Component Value Date   ALT 17 11/03/2014   AST 33 11/03/2014   ALKPHOS 72 11/03/2014   BILITOT 0.6 11/03/2014   No results for input(s): APTT, INR, PTT in the last 168 hours. CBC Latest Ref Rng 11/06/2014 11/05/2014 11/04/2014  WBC 3.6 - 11.0 K/uL 7.2 7.1 -  Hemoglobin 12.0  - 16.0 g/dL 7.4(L) 7.8(L) 9.3(L)  Hematocrit 35.0 - 47.0 % 22.6(L) 23.7(L) 28.2(L)  Platelets 150 - 440 K/uL 187 187 -    STUDIES: No results found.  Assessment: Anemia, multi factorial leading of chronic kidney disease and alcohol abuse. She does have a history of gastric ulcer past due to NSAIDs and currently takes NSAIDs on occasion. She is Hemoccult negative to my examination.  Recommendations:  Agree with repeat transfusion. Continue PPI. We will arrange for EGD tomorrow is likely in the afternoon. I have discussed the risks benefits and complications of EGD with patient to include not limited to bleeding infection perforation and the risk of sedation and she wishes to proceed.  Thank you for the consult. Please call with questions or concerns.  Lollie Sails, MD  11/06/2014 9:39 PM

## 2014-11-06 NOTE — Progress Notes (Signed)
[facility]  ROUNDING NOTE   Subjective:   Patient more awake and alert. Eating breakfast. Sitting up in bed.  UOP not being recorded.   Objective:  Vital signs in last 24 hours:  Temp:  [97.8 F (36.6 C)-98.4 F (36.9 C)] 98.4 F (36.9 C) (05/08 0752) Pulse Rate:  [73-79] 77 (05/08 0752) Resp:  [17-19] 18 (05/08 0752) BP: (118-128)/(67-80) 118/68 mmHg (05/08 0752) SpO2:  [100 %] 100 % (05/08 0752)  Weight change:  Filed Weights   11/03/14 0411  Weight: 51.909 kg (114 lb 7 oz)    Intake/Output: I/O last 3 completed shifts: In: 3523.2 [P.O.:673; I.V.:2750.2; IV Piggyback:100] Out: -    Intake/Output this shift:  Total I/O In: 120 [P.O.:120] Out: -   Physical Exam: General: NAD,   Head: Normocephalic, atraumatic. Dry mucosal membranes  Eyes: Anicteric, PERRL  Neck: Supple, trachea midline  Lungs:  Clear to auscultation  Heart: Regular rate and rhythm  Abdomen:  Soft, nontender,   Extremities: no peripheral edema.  Neurologic: Nonfocal, moving all four extremities, alert and oriented.   Skin: No lesions       Basic Metabolic Panel:  Recent Labs Lab 11/03/14 0439 11/04/14 0531 11/05/14 0435 11/06/14 0433  NA 138 136 135 133*  K 4.7 4.2 4.1 3.9  CL 102 104 108 108  CO2 26 25 22  21*  GLUCOSE 89 76 112* 122*  BUN 72* 63* 57* 52*  CREATININE 5.10* 4.38* 3.80* 3.39*  CALCIUM 8.9 7.8* 7.5* 7.5*  PHOS  --   --   --  2.9    Liver Function Tests:  Recent Labs Lab 11/03/14 0439 11/06/14 0433  AST 33  --   ALT 17  --   ALKPHOS 72  --   BILITOT 0.6  --   PROT 7.0  --   ALBUMIN 2.5* 1.9*    Recent Labs Lab 11/03/14 0439  LIPASE 28   No results for input(s): AMMONIA in the last 168 hours.  CBC:  Recent Labs Lab 11/03/14 0439 11/04/14 0531 11/04/14 1951 11/05/14 0435 11/06/14 0433  WBC 9.0 7.2  --  7.1 7.2  NEUTROABS 6.9*  --   --   --   --   HGB 7.6* 7.0* 9.3* 7.8* 7.4*  HCT 23.8* 21.4* 28.2* 23.7* 22.6*  MCV 97.6 98.0   --  93.7 95.7  PLT 225 201  --  187 187    Cardiac Enzymes:  Recent Labs Lab 11/03/14 0439  TROPONINI 0.04*    BNP: Invalid input(s): POCBNP  CBG: No results for input(s): GLUCAP in the last 168 hours.  Microbiology: Results for orders placed or performed during the hospital encounter of 11/03/14  Urine culture     Status: None (Preliminary result)   Collection Time: 11/03/14  4:39 AM  Result Value Ref Range Status   Specimen Description URINE, CATHETERIZED  Final   Special Requests NONE  Final   Culture   Final    >=100,000 COLONIES/mL GRAM NEGATIVE RODS IDENTIFICATION TO FOLLOW SUSCEPTIBILITIES TO FOLLOW    Report Status PENDING  Incomplete  C difficile quick scan w PCR reflex [facility])     Status: None   Collection Time: 11/05/14  1:42 PM  Result Value Ref Range Status   C Diff antigen POSITIVE  Final   C Diff toxin NEGATIVE  Final   C Diff interpretation   Final    Negative for toxigenic C. difficile. Toxin gene and active toxin production not detected. May  be a nontoxigenic strain of C. difficile bacteria present, lacking the ability to produce toxin.  Clostridium Difficile by PCR     Status: None   Collection Time: 11/05/14  1:42 PM  Result Value Ref Range Status   C difficile by pcr NEGATIVE NEGATIVE Final    Coagulation Studies: No results for input(s): LABPROT, INR in the last 72 hours.  Urinalysis: No results for input(s): COLORURINE, LABSPEC, PHURINE, GLUCOSEU, HGBUR, BILIRUBINUR, KETONESUR, PROTEINUR, UROBILINOGEN, NITRITE, LEUKOCYTESUR in the last 72 hours.  Invalid input(s): APPERANCEUR    Imaging: No results found.   Medications:   . sodium chloride 75 mL/hr at 11/06/14 1039   . cefTRIAXone (ROCEPHIN)  IV  1 g Intravenous Q24H  . cholecalciferol  1,000 Units Oral Daily  . febuxostat  40 mg Oral Daily  . ferrous sulfate  325 mg Oral Q breakfast  . folic acid  1 mg Oral Daily  . heparin  5,000 Units Subcutaneous 3 times per day  .  levothyroxine  50 mcg Oral QAC breakfast  . multivitamin with minerals  1 tablet Oral Daily  . oxybutynin  5 mg Oral QHS  . pantoprazole  40 mg Oral Daily  . polyethylene glycol  17 g Oral Daily  . predniSONE  20 mg Oral Q breakfast  . thiamine  100 mg Oral Daily   acetaminophen, docusate sodium  Assessment/ Plan:  80 y.o. black female with chronic alcoholism, anemia, gout, hypertension admitted on 11/03/2014 For urinary tract infection and acute renal failure on chronic kidney disease stage III.   1. Acute renal failure on chronic kidney disease stage III with proteinuria: history suggestive of prerenal azotemia.  Agree with IV fluids. Currently NS at 67mL/hr. Creatinine and BUN improving. Clinically patient looks better.  Renally dose all medications. Currently not monitoring urine output. No acute indication for dialysis at this time.   2. Urinary Tract Infection: empiric coverage with ceftriaxone. Urine cultures pending. Gram negative rods  3. Anemia: hemoglobin 7.4. Status post 1 unit transfusion PRBC on 5/6.  With history of GI bleed and alcohol abuse.  - Continue to monitor hemoglobin.  - GI consult - low threshold for epo.      LOS: 3 [name] Holmes 5/8/201612:39 PM

## 2014-11-06 NOTE — Progress Notes (Signed)
[place] [facility] [place] NAME: [name] Holmes    MR#:  [id number]  DATE OF BIRTH:  14-Jan-1935  SUBJECTIVE: Hemoglobin dropping, 7.4 this morning. She feels tired but wants to go home. Kidney function improving   CHIEF COMPLAINT:   Chief Complaint  Patient presents with  . Weakness  . Leg Pain    REVIEW OF SYSTEMS:  CONSTITUTIONAL: No fever, positive for fatigue and generalized weakness.  EYES: No blurred or double vision.  EARS, NOSE, AND THROAT: No tinnitus or ear pain.  RESPIRATORY: No cough, shortness of breath, wheezing or hemoptysis.  CARDIOVASCULAR: No chest pain, orthopnea, edema.  GASTROINTESTINAL: No nausea, vomiting, diarrhea or abdominal pain.  GENITOURINARY: No dysuria, hematuria. Have incontinence. ENDOCRINE: No polyuria, nocturia,  HEMATOLOGY: No anemia, easy bruising or bleeding SKIN: No rash or lesion. MUSCULOSKELETAL: No joint pain or arthritis.   NEUROLOGIC: No tingling, numbness, weakness.  PSYCHIATRY: No anxiety or depression.    DRUG ALLERGIES:  No Known Allergies  VITALS:  Blood pressure 118/68, pulse 77, temperature 98.4 F (36.9 C), temperature source Oral, resp. rate 18, height 5\' 4"  (1.626 m), weight 51.909 kg (114 lb 7 oz), SpO2 100 %.  PHYSICAL EXAMINATION:  GENERAL:  80 y.o.-year-old patient lying in the bed with no acute distress.  EYES: Pupils equal, round, reactive to light and accommodation. No scleral icterus. Extraocular muscles intact. Conjunctiva pale. HEENT: Head atraumatic, normocephalic. Oropharynx and nasopharynx clear. Oral mucosa apears dry.  NECK:  Supple, no jugular venous distention. No thyroid enlargement, no tenderness.  LUNGS: Normal breath sounds bilaterally, no wheezing, rales,rhonchi or crepitation. No use of accessory muscles of respiration.  CARDIOVASCULAR: S1, S2 normal. No murmurs, rubs, or gallops.  ABDOMEN: Soft, nontender, nondistended. Bowel sounds present. No  organomegaly or mass.  EXTREMITIES:mild pedal edema, no cyanosis, or clubbing. Pulses intact in distal arteries. NEUROLOGIC: Cranial nerves II through XII are intact. Muscle strength 4/5 in all extremities. Sensation intact. Gait not checked.  PSYCHIATRIC: The patient is alert and oriented x 3.  SKIN: No obvious rash, lesion, or ulcer.   LABORATORY PANEL:   CBC  Recent Labs Lab 11/06/14 0433  WBC 7.2  HGB 7.4*  HCT 22.6*  PLT 187   ------------------------------------------------------------------------------------------------------------------  Chemistries   Recent Labs Lab 11/03/14 0439  11/06/14 0433  NA 138  < > 133*  K 4.7  < > 3.9  CL 102  < > 108  CO2 26  < > 21*  GLUCOSE 89  < > 122*  BUN 72*  < > 52*  CREATININE 5.10*  < > 3.39*  CALCIUM 8.9  < > 7.5*  AST 33  --   --   ALT 17  --   --   ALKPHOS 72  --   --   BILITOT 0.6  --   --   < > = values in this interval not displayed. ------------------------------------------------------------------------------------------------------------------  Cardiac Enzymes  Recent Labs Lab 11/03/14 0439  TROPONINI 0.04*   ------------------------------------------------------------------------------------------------------------------  RADIOLOGY:  No results found.  EKG:   Orders placed or performed during the hospital encounter of 11/03/14  . EKG 12-Lead  . EKG 12-Lead    ASSESSMENT AND PLAN:   This is a 80 year old female admitted for urinary tract infection and acute on chronic kidney disease.  1. Urinary tract infection: IV rocephin, wait for final culture report. Growing 100K CFU GNR.  2. Acute on chronic kidney disease:     Appreciate nephrology consult -  continue gentle hydration. At this time do not require dialysis.   Slow gradual improvement in creatinine - 3.39 .  3. Anemia: This is likely multifactorial. Likely due to chronic kidney disease. She has been seen by hematology in the past and has a  history of upper GI bleed. She has no symptoms of upper GI bleeding at this time.  anemia of chronic disease as well as iron deficiency anemia.   a component of dilution, as pt is feeling weak and Hb -7- transfused one unit 11/04/14. Her hemoglobin is 7.4 - check in the morning. If it is close to 7, may need 1 more unit of blood transfusion  4. Weakness:   likely secondary to a combination of the above factors.   PT suggested SNF- rehab, but pt refuse for that.  will have to d/c Home with [facility] in next 1-2 days.  5. Hypothyroidism: normal TSH. Continue Synthroid  6. Gout:  continue Uloric as well as allopurinol in renal dosing  c/o pain- oral steroids.  7. History of alcohol abuse:  no signs of withdrawal.   8. DVT prophylaxis: Heparin   All the records are reviewed and case discussed with nurse. Management plans discussed with the patient, family and they are in agreement.  CODE STATUS: full  TOTAL TIME TAKING CARE OF THIS PATIENT: 35 minutes.   POSSIBLE D/C IN 1-2 DAYS, DEPENDING ON CLINICAL CONDITION. Waiting for final urine cx, and improvement in renal function and hemoglobin to safe range as she may need another unit of transfusion.   [facility], Neda Willenbring M.D on 11/06/2014 at 11:06 AM  Between 7am to 6pm - Pager - [phone]  After 6pm go to [email or web address] - password [email or web address] [facility]  Office  [phone]  CC: Primary care physician; Marguerita Merles, MD

## 2014-11-07 LAB — CBC
HCT: 21.6 % — ABNORMAL LOW (ref 35.0–47.0)
HEMOGLOBIN: 7 g/dL — AB (ref 12.0–16.0)
MCH: 30.5 pg (ref 26.0–34.0)
MCHC: 32.3 g/dL (ref 32.0–36.0)
MCV: 94.3 fL (ref 80.0–100.0)
Platelets: 171 10*3/uL (ref 150–440)
RBC: 2.29 MIL/uL — AB (ref 3.80–5.20)
RDW: 17.3 % — ABNORMAL HIGH (ref 11.5–14.5)
WBC: 8.7 10*3/uL (ref 3.6–11.0)

## 2014-11-07 LAB — BASIC METABOLIC PANEL
Anion gap: 5 (ref 5–15)
BUN: 49 mg/dL — ABNORMAL HIGH (ref 6–20)
CO2: 20 mmol/L — ABNORMAL LOW (ref 22–32)
Calcium: 7.6 mg/dL — ABNORMAL LOW (ref 8.9–10.3)
Chloride: 108 mmol/L (ref 101–111)
Creatinine, Ser: 3.13 mg/dL — ABNORMAL HIGH (ref 0.44–1.00)
GFR calc Af Amer: 15 mL/min — ABNORMAL LOW (ref >60)
GFR calc non Af Amer: 13 mL/min — ABNORMAL LOW (ref >60)
GLUCOSE: 84 mg/dL (ref 65–99)
POTASSIUM: 4.1 mmol/L (ref 3.5–5.1)
Sodium: 133 mmol/L — ABNORMAL LOW (ref 135–145)

## 2014-11-07 LAB — PREPARE RBC (CROSSMATCH)

## 2014-11-07 LAB — PROTIME-INR
INR: 1.1
PROTHROMBIN TIME: 14.4 s (ref 11.4–15.0)

## 2014-11-07 MED ORDER — SODIUM CHLORIDE 0.9 % IV SOLN: Freq: Once | INTRAVENOUS | Status: DC

## 2014-11-07 NOTE — Progress Notes (Signed)
Clinical Social Worker (CSW) met with patient again to discuss D/C options. Patient continues to adamantly refuse SNF. Patient is agreeable to going home with home health. CSW will continue to follow and assist as needed.   Blima Rich, [place] [phone]

## 2014-11-07 NOTE — Progress Notes (Signed)
Physical Therapy Treatment Patient Details Name: [name] Holmes MRN: [id number] DOB: 04-Jun-1935 Today's Date: 11/07/2014    History of Present Illness 80 yo female with onset of UTI and cardiomegaly with acute chronic kidney disease and elevated troponin was admitted for elevated creatinine.  PMHx:  OA, HTN, anemia, gout, alcohol abuse    PT Comments    Pt is scheduled for transfusion and to get endoscopy to get to the bottom of her bleeding and anemia issues.  Her plan is SNF but initially has declined, although she is not feeling well enough to get back into gait yet.  Hopefully a plan to be up on walker tomorrow will happen and can try some steps to get to chair, then see if home is even a possibility.  For now SNF is plan.  Follow Up Recommendations  SNF     Equipment Recommendations  None recommended by PT    Recommendations for Other Services       Precautions / Restrictions Precautions Precautions: Fall Precaution Comments: Nothing by mouth until endoscopy today Restrictions Weight Bearing Restrictions: No    Mobility  Bed Mobility Overal bed mobility: Needs Assistance Bed Mobility: Rolling Rolling: Mod assist   Supine to sit: Mod assist Sit to supine: Min assist   General bed mobility comments: Better effort and better recall of sequence  Transfers                 General transfer comment: unable due to pending transfusion, nsg asked PT not to do this with pt  Ambulation/Gait             General Gait Details: Unable to do today   Stairs            Wheelchair Mobility    Modified Rankin (Stroke Patients Only)       Balance Overall balance assessment: Needs assistance Sitting-balance support: Bilateral upper extremity supported;Feet unsupported Sitting balance-Leahy Scale: Poor Sitting balance - Comments: continual cuing for pt to forward lean bedside wtih posture,                              Cognition  Arousal/Alertness: Awake/alert Behavior During Therapy: Anxious Overall Cognitive Status: Impaired/Different from baseline Area of Impairment: Memory;Attention;Awareness   Current Attention Level: Alternating Memory: Decreased short-term memory     Awareness: Intellectual Problem Solving: Slow processing;Decreased initiation;Requires tactile cues General Comments: Pt is having a struggle with reason for being NPO.  Explained x 3    Exercises General Exercises - Lower Extremity Ankle Circles/Pumps: AROM;Both;10 reps Short Arc Quad: Strengthening;Both;10 reps Heel Slides: Strengthening;Both;10 reps Hip ABduction/ADduction: Strengthening;Both;15 reps Hip Flexion/Marching: AROM;Both;10 reps    General Comments General comments (skin integrity, edema, etc.): Pt is having a struggle with being hungry but hgb is 7.0 and will be transfused today, along with prep for endoscopy being done including NPO      Pertinent Vitals/Pain Pain Assessment: No/denies pain    Home Living                      Prior Function            PT Goals (current goals can now be found in the care plan section) Acute Rehab PT Goals Patient Stated Goal: Home Progress towards PT goals: Progressing toward goals (slowly due to medical issues)    Frequency  Min 2X/week    PT Plan Current plan remains appropriate  Co-evaluation             End of Session   Activity Tolerance: Patient limited by fatigue Patient left: in bed;with call bell/phone within reach;with bed alarm set     Time: 0950-1015 PT Time Calculation (min) (ACUTE ONLY): 25 min  Charges:  $Therapeutic Exercise: 8-22 mins $Therapeutic Activity: 8-22 mins                    G Codes:      Ramond Dial 11-23-2014, 10:53 AM   Mee Hives, PT MS Acute Rehab Dept. Number: [phone]

## 2014-11-07 NOTE — Clinical Documentation Improvement (Signed)
Pt admitted with acute kidney failure and UTI.  Has a urine culture growing e coli and C Diff antigen positive.  Please clarify clinical significance of urine culture and C Diff report.  Thank You, Margretta Sidle ,RN Clinical Documentation Specialist:    [facility] [phone]  Cell - [phone]

## 2014-11-07 NOTE — Progress Notes (Signed)
[place] at [place] NAME: [name] Holmes    MR#:  [id number]  DATE OF BIRTH:  03-Oct-1934  SUBJECTIVE: Hemoglobin dropping, 7.4 this morning. She feels tired but wants to go home. Kidney function improving   CHIEF COMPLAINT:   Chief Complaint  Patient presents with  . Weakness  . Leg Pain    REVIEW OF SYSTEMS:  CONSTITUTIONAL: No fever, positive for fatigue and generalized weakness.  EYES: No blurred or double vision.  EARS, NOSE, AND THROAT: No tinnitus or ear pain.  RESPIRATORY: No cough, shortness of breath, wheezing or hemoptysis.  CARDIOVASCULAR: No chest pain, orthopnea, edema.  GASTROINTESTINAL: No nausea, vomiting, diarrhea or abdominal pain.  GENITOURINARY: No dysuria, hematuria. Have incontinence. ENDOCRINE: No polyuria, nocturia,  HEMATOLOGY: No anemia, easy bruising or bleeding SKIN: No rash or lesion. MUSCULOSKELETAL: No joint pain or arthritis.   NEUROLOGIC: No tingling, numbness, weakness.  PSYCHIATRY: No anxiety or depression.    DRUG ALLERGIES:  No Known Allergies  VITALS:  Blood pressure 121/65, pulse 75, temperature 98.1 F (36.7 C), temperature source Oral, resp. rate 18, height 5\' 4"  (1.626 m), weight 51.909 kg (114 lb 7 oz), SpO2 100 %.  PHYSICAL EXAMINATION:  GENERAL:  80 y.o.-year-old patient lying in the bed with no acute distress.  EYES: Pupils equal, round, reactive to light and accommodation. No scleral icterus. Extraocular muscles intact. Conjunctiva pale. HEENT: Head atraumatic, normocephalic. Oropharynx and nasopharynx clear. Oral mucosa apears dry.  NECK:  Supple, no jugular venous distention. No thyroid enlargement, no tenderness.  LUNGS: Normal breath sounds bilaterally, no wheezing, rales,rhonchi or crepitation. No use of accessory muscles of respiration.  CARDIOVASCULAR: S1, S2 normal. No murmurs, rubs, or gallops.  ABDOMEN: Soft, nontender, nondistended. Bowel sounds present. No  organomegaly or mass.  EXTREMITIES:mild pedal edema, no cyanosis, or clubbing. Pulses intact in distal arteries. NEUROLOGIC: Cranial nerves II through XII are intact. Muscle strength 4/5 in all extremities. Sensation intact. Gait not checked.  PSYCHIATRIC: The patient is alert and oriented x 3.  SKIN: No obvious rash, lesion, or ulcer.   LABORATORY PANEL:   CBC  Recent Labs Lab 11/07/14 0421  WBC 8.7  HGB 7.0*  HCT 21.6*  PLT 171   ------------------------------------------------------------------------------------------------------------------  Chemistries   Recent Labs Lab 11/03/14 0439  11/07/14 0421  NA 138  < > 133*  K 4.7  < > 4.1  CL 102  < > 108  CO2 26  < > 20*  GLUCOSE 89  < > 84  BUN 72*  < > 49*  CREATININE 5.10*  < > 3.13*  CALCIUM 8.9  < > 7.6*  AST 33  --   --   ALT 17  --   --   ALKPHOS 72  --   --   BILITOT 0.6  --   --   < > = values in this interval not displayed. ------------------------------------------------------------------------------------------------------------------  Cardiac Enzymes  Recent Labs Lab 11/03/14 0439  TROPONINI 0.04*   ------------------------------------------------------------------------------------------------------------------  RADIOLOGY:  No results found.  EKG:   Orders placed or performed during the hospital encounter of 11/03/14  . EKG 12-Lead  . EKG 12-Lead    ASSESSMENT AND PLAN:   This is a 80 year old female admitted for urinary tract infection and acute on chronic kidney disease.  1. Urinary tract infection: IV rocephin, still waiting for final culture report. Growing 100K CFU GNR.  2. Acute on chronic kidney disease:     Appreciate nephrology  consult - continue gentle hydration. At this time do not require dialysis.   Slow gradual improvement in creatinine - 3.39 .  3. Anemia: This is likely multifactorial. Likely due to chronic kidney disease. She has been seen by hematology in the past  and has a history of upper GI bleed.  anemia of chronic disease as well as iron deficiency anemia.   a component of dilution, as pt is feeling weak and Hb -7- transfused one unit 11/04/14. Her hemoglobin is 7.4 - check in the morning. again 7, Will give one more unit transfusion today.   Seen by GI- Endoscopy today. 4. Weakness:   likely secondary to a combination of the above factors.   PT suggested SNF- rehab, but pt refuse for that.  will have to d/c Home with [facility] in next 1-2 days.  5. Hypothyroidism: normal TSH. Continue Synthroid  6. Gout:  continue Uloric as well as allopurinol in renal dosing  c/o pain- oral steroids.  7. History of alcohol abuse:  no signs of withdrawal.   8. DVT prophylaxis: Heparin   All the records are reviewed and case discussed with nurse. Management plans discussed with the patient, family and they are in agreement.  CODE STATUS: full  TOTAL TIME TAKING CARE OF THIS PATIENT: 35 minutes.   POSSIBLE D/C IN 1-2 DAYS, DEPENDING ON CLINICAL CONDITION. Waiting for final urine cx, and improvement in renal function and hemoglobin to safe range as she may need another unit of transfusion.   Vaughan Basta M.D on 11/07/2014 at 11:47 AM  Between 7am to 6pm - Pager - [phone]  After 6pm go to [email or web address] - password [email or web address] [facility]  Office  [phone]  CC: Primary care physician; Marguerita Merles, MD

## 2014-11-07 NOTE — Progress Notes (Signed)
Pt to be clear liquid, no red tomorrow morning for breakfast. NPO with sips for meds after.  Endo procedure tomorrow afternoon.  VSS this shift. Pt received on unit of blood today.

## 2014-11-07 NOTE — Progress Notes (Signed)
Subjective:   Awaiting endoscopy NPO . Asking for food No N/V. No SOB S Cr improving as noted below   Objective:  Vital signs in last 24 hours:  Temp:  [97.8 F (36.6 C)-98.8 F (37.1 C)] 97.9 F (36.6 C) (05/09 0846) Pulse Rate:  [72-81] 72 (05/09 0846) Resp:  [16-18] 18 (05/09 0846) BP: (109-123)/(69-76) 121/70 mmHg (05/09 0846) SpO2:  [100 %] 100 % (05/09 0846)  Weight change:  Filed Weights   11/03/14 0411  Weight: 51.909 kg (114 lb 7 oz)    Intake/Output: I/O last 3 completed shifts: In: 2866.7 [P.O.:673; I.V.:2093.7; IV Piggyback:100] Out: -    Intake/Output this shift:     Physical Exam: General: NAD,   Head: Normocephalic, atraumatic. Dry mucosal membranes  Eyes: Anicteric,   Neck: Supple, trachea midline  Lungs:  Clear to auscultation  Heart: Regular rate and rhythm  Abdomen:  Soft, nontender,   Extremities: no peripheral edema.  Neurologic: Nonfocal, moving all four extremities, alert and oriented.   Skin: No lesions       Basic Metabolic Panel:  Recent Labs Lab 11/03/14 0439 11/04/14 0531 11/05/14 0435 11/06/14 0433 11/07/14 0421  NA 138 136 135 133* 133*  K 4.7 4.2 4.1 3.9 4.1  CL 102 104 108 108 108  CO2 26 25 22  21* 20*  GLUCOSE 89 76 112* 122* 84  BUN 72* 63* 57* 52* 49*  CREATININE 5.10* 4.38* 3.80* 3.39* 3.13*  CALCIUM 8.9 7.8* 7.5* 7.5* 7.6*  PHOS  --   --   --  2.9  --     Liver Function Tests:  Recent Labs Lab 11/03/14 0439 11/06/14 0433  AST 33  --   ALT 17  --   ALKPHOS 72  --   BILITOT 0.6  --   PROT 7.0  --   ALBUMIN 2.5* 1.9*    Recent Labs Lab 11/03/14 0439  LIPASE 28   No results for input(s): AMMONIA in the last 168 hours.  CBC:  Recent Labs Lab 11/03/14 0439 11/04/14 0531 11/04/14 1951 11/05/14 0435 11/06/14 0433 11/07/14 0421  WBC 9.0 7.2  --  7.1 7.2 8.7  NEUTROABS 6.9*  --   --   --   --   --   HGB 7.6* 7.0* 9.3* 7.8* 7.4* 7.0*  HCT 23.8* 21.4* 28.2* 23.7* 22.6* 21.6*  MCV 97.6 98.0   --  93.7 95.7 94.3  PLT 225 201  --  187 187 171    Cardiac Enzymes:  Recent Labs Lab 11/03/14 0439  TROPONINI 0.04*    BNP: Invalid input(s): POCBNP  CBG: No results for input(s): GLUCAP in the last 168 hours.  Microbiology: Results for orders placed or performed during the hospital encounter of 11/03/14  Urine culture     Status: None (Preliminary result)   Collection Time: 11/03/14  4:39 AM  Result Value Ref Range Status   Specimen Description URINE, CATHETERIZED  Final   Special Requests NONE  Final   Culture   Final    >=100,000 COLONIES/mL GRAM NEGATIVE RODS IDENTIFICATION TO FOLLOW SUSCEPTIBILITIES TO FOLLOW    Report Status PENDING  Incomplete  C difficile quick scan w PCR reflex [facility])     Status: None   Collection Time: 11/05/14  1:42 PM  Result Value Ref Range Status   C Diff antigen POSITIVE  Final   C Diff toxin NEGATIVE  Final   C Diff interpretation   Final    Negative for toxigenic C.  difficile. Toxin gene and active toxin production not detected. May be a nontoxigenic strain of C. difficile bacteria present, lacking the ability to produce toxin.  Clostridium Difficile by PCR     Status: None   Collection Time: 11/05/14  1:42 PM  Result Value Ref Range Status   C difficile by pcr NEGATIVE NEGATIVE Final    Coagulation Studies:  Recent Labs  11/07/14 0421  LABPROT 14.4  INR 1.10    Urinalysis: No results for input(s): COLORURINE, LABSPEC, PHURINE, GLUCOSEU, HGBUR, BILIRUBINUR, KETONESUR, PROTEINUR, UROBILINOGEN, NITRITE, LEUKOCYTESUR in the last 72 hours.  Invalid input(s): APPERANCEUR    Imaging: No results found.   Medications:   . sodium chloride 20 mL/hr at 11/06/14 2008   . sodium chloride   Intravenous Once  . cefTRIAXone (ROCEPHIN)  IV  1 g Intravenous Q24H  . cholecalciferol  1,000 Units Oral Daily  . febuxostat  40 mg Oral Daily  . ferrous sulfate  325 mg Oral Q breakfast  . folic acid  1 mg Oral Daily  . heparin  5,000  Units Subcutaneous 3 times per day  . levothyroxine  50 mcg Oral QAC breakfast  . multivitamin with minerals  1 tablet Oral Daily  . oxybutynin  5 mg Oral QHS  . pantoprazole  40 mg Oral Daily  . polyethylene glycol  17 g Oral Daily  . predniSONE  20 mg Oral Q breakfast  . thiamine  100 mg Oral Daily   acetaminophen, docusate sodium  Assessment/ Plan:  80 y.o. black female with chronic alcoholism, anemia, gout, hypertension admitted on 11/03/2014 For urinary tract infection and acute renal failure on chronic kidney disease stage III.   1. Acute renal failure on chronic kidney disease stage III with proteinuria:   Agree with IV fluids.  Renally dose all medications.  No acute indication for dialysis at this time.  Supportive care  2. Urinary Tract Infection: empiric coverage with ceftriaxone. Urine cultures pending. Gram negative rods  3. Anemia: hemoglobin 7.0. Status post 1 unit transfusion PRBC on 5/6.  With history of GI bleed and alcohol abuse.  - Continue to monitor hemoglobin.  - Scope today   LOS: 4 [name] Holmes 5/9/201610:16 AM

## 2014-11-07 NOTE — Progress Notes (Signed)
°  Chaplain offer prayer and emotional support Chaplain Allyne Gee. Alroy Dust Monday 5 -03-2015   11/07/14 2000  Clinical Encounter Type  Visited With Patient  Visit Type Initial  Referral From Chaplain  Consult/Referral To Chaplain  Spiritual Encounters  Spiritual Needs Prayer;Emotional  Stress Factors  Patient Stress Factors Health changes  Family Stress Factors Health changes

## 2014-11-07 NOTE — Consult Note (Signed)
Subjective: Patient seen for anemia. An able to do EGD today as patient ate some old material almost immediately before she was posted then brought to the Endo department. She denies any nausea vomiting or abdominal pain.  Objective: Vital signs in last 24 hours: Temp:  [97.9 F (36.6 C)-98.8 F (37.1 C)] 98.3 F (36.8 C) (05/09 1455) Pulse Rate:  [70-79] 73 (05/09 1455) Resp:  [16-18] 17 (05/09 1455) BP: (109-152)/(61-88) 138/61 mmHg (05/09 1455) SpO2:  [99 %-100 %] 99 % (05/09 1455) Blood pressure 138/61, pulse 73, temperature 98.3 F (36.8 C), temperature source Oral, resp. rate 17, height 5\' 4"  (1.626 m), weight 51.909 kg (114 lb 7 oz), SpO2 99 %.   Intake/Output from previous day: 05/08 0701 - 05/09 0700 In: 1715.7 [P.O.:480; I.V.:1135.7; IV Piggyback:100] Out: -   Intake/Output this shift: Total I/O In: 310 [Blood:310] Out: -    General appearance:  Elderly-appearing somewhat disheveled woman in no acute distress Resp:  Clear to auscultation Cardio:  Regular rate and rhythm GI:  Soft nontender nondistended bowel sounds positive normoactive Extremities:  Trace edema extremities   Lab Results: Results for orders placed or performed during the hospital encounter of 11/03/14 (from the past 24 hour(s))  CBC     Status: Abnormal   Collection Time: 11/07/14  4:21 AM  Result Value Ref Range   WBC 8.7 3.6 - 11.0 K/uL   RBC 2.29 (L) 3.80 - 5.20 MIL/uL   Hemoglobin 7.0 (L) 12.0 - 16.0 g/dL   HCT 21.6 (L) 35.0 - 47.0 %   MCV 94.3 80.0 - 100.0 fL   MCH 30.5 26.0 - 34.0 pg   MCHC 32.3 32.0 - 36.0 g/dL   RDW 17.3 (H) 11.5 - 14.5 %   Platelets 171 150 - 440 K/uL  Protime-INR     Status: None   Collection Time: 11/07/14  4:21 AM  Result Value Ref Range   Prothrombin Time 14.4 11.4 - 15.0 seconds   INR 4.40   Basic metabolic panel     Status: Abnormal   Collection Time: 11/07/14  4:21 AM  Result Value Ref Range   Sodium 133 (L) 135 - 145 mmol/L   Potassium 4.1 3.5 - 5.1  mmol/L   Chloride 108 101 - 111 mmol/L   CO2 20 (L) 22 - 32 mmol/L   Glucose, Bld 84 65 - 99 mg/dL   BUN 49 (H) 6 - 20 mg/dL   Creatinine, Ser 3.13 (H) 0.44 - 1.00 mg/dL   Calcium 7.6 (L) 8.9 - 10.3 mg/dL   GFR calc non Af Amer 13 (L) >60 mL/min   GFR calc Af Amer 15 (L) >60 mL/min   Anion gap 5 5 - 15      Recent Labs  11/05/14 0435 11/06/14 0433 11/07/14 0421  WBC 7.1 7.2 8.7  HGB 7.8* 7.4* 7.0*  HCT 23.7* 22.6* 21.6*  PLT 187 187 171   BMET  Recent Labs  11/05/14 0435 11/06/14 0433 11/07/14 0421  NA 135 133* 133*  K 4.1 3.9 4.1  CL 108 108 108  CO2 22 21* 20*  GLUCOSE 112* 122* 84  BUN 57* 52* 49*  CREATININE 3.80* 3.39* 3.13*  CALCIUM 7.5* 7.5* 7.6*   LFT  Recent Labs  11/06/14 0433  ALBUMIN 1.9*   PT/INR  Recent Labs  11/07/14 0421  LABPROT 14.4  INR 1.10   Hepatitis Panel No results for input(s): HEPBSAG, HCVAB, HEPAIGM, HEPBIGM in the last 72 hours. C-Diff  Recent  Labs  11/05/14 1342  CDIFFTOX NEGATIVE    Recent Labs  11/05/14 1342  CDIFFPCR NEGATIVE     Studies/Results: No results found.  Scheduled Inpatient Medications:   . sodium chloride   Intravenous Once  . cefTRIAXone (ROCEPHIN)  IV  1 g Intravenous Q24H  . cholecalciferol  1,000 Units Oral Daily  . febuxostat  40 mg Oral Daily  . ferrous sulfate  325 mg Oral Q breakfast  . folic acid  1 mg Oral Daily  . heparin  5,000 Units Subcutaneous 3 times per day  . levothyroxine  50 mcg Oral QAC breakfast  . multivitamin with minerals  1 tablet Oral Daily  . oxybutynin  5 mg Oral QHS  . pantoprazole  40 mg Oral Daily  . polyethylene glycol  17 g Oral Daily  . predniSONE  20 mg Oral Q breakfast  . thiamine  100 mg Oral Daily    Continuous Inpatient Infusions:   . sodium chloride 20 mL/hr at 11/06/14 2008    PRN Inpatient Medications:  acetaminophen, docusate sodium  Miscellaneous:   Assessment:  1. Anemia in the setting of previous history of NSAID related  peptic ulcer disease. She has been recently using NSAIDs though not very often. Likely multifactorial in setting of CK D and ongoing alcohol use.  Plan:  1. Plan for doing her EGD tomorrow afternoon. She may have some clear liquids for breakfast but nothing after that. Discussed this in detail with patient and with nursing. As discussed with Dr. Marthann Schiller, recommend transfusion of 1 unit of packed red cells this evening.  Lollie Sails MD 11/07/2014, 6:13 PM

## 2014-11-08 ENCOUNTER — Inpatient Hospital Stay: Payer: Medicare Other | Admitting: Certified Registered Nurse Anesthetist

## 2014-11-08 ENCOUNTER — Encounter
Admission: EM | Disposition: A | Discharge status: Home Health | Payer: Self-pay | Source: Home / Self Care | Attending: Internal Medicine

## 2014-11-08 HISTORY — PX: ESOPHAGOGASTRODUODENOSCOPY (EGD) WITH PROPOFOL: SHX5813

## 2014-11-08 LAB — CBC
HCT: 28.2 % — ABNORMAL LOW (ref 35.0–47.0)
Hemoglobin: 9.6 g/dL — ABNORMAL LOW (ref 12.0–16.0)
MCH: 31.9 pg (ref 26.0–34.0)
MCHC: 34 g/dL (ref 32.0–36.0)
MCV: 93.8 fL (ref 80.0–100.0)
PLATELETS: 143 10*3/uL — AB (ref 150–440)
RBC: 3.01 MIL/uL — ABNORMAL LOW (ref 3.80–5.20)
RDW: 16.8 % — AB (ref 11.5–14.5)
WBC: 10.9 10*3/uL (ref 3.6–11.0)

## 2014-11-08 LAB — VITAMIN D 1,25 DIHYDROXY
Vitamin D 1, 25 (OH)2 Total: 18 pg/mL
Vitamin D2 1, 25 (OH)2: <10 pg/mL
Vitamin D3 1, 25 (OH)2: 18 pg/mL

## 2014-11-08 LAB — BASIC METABOLIC PANEL
Anion gap: 6 (ref 5–15)
BUN: 47 mg/dL — ABNORMAL HIGH (ref 6–20)
CO2: 22 mmol/L (ref 22–32)
Calcium: 8 mg/dL — ABNORMAL LOW (ref 8.9–10.3)
Chloride: 105 mmol/L (ref 101–111)
Creatinine, Ser: 2.95 mg/dL — ABNORMAL HIGH (ref 0.44–1.00)
GFR calc Af Amer: 17 mL/min — ABNORMAL LOW (ref >60)
GFR calc non Af Amer: 14 mL/min — ABNORMAL LOW (ref >60)
Glucose, Bld: 79 mg/dL (ref 65–99)
Potassium: 4.4 mmol/L (ref 3.5–5.1)
Sodium: 133 mmol/L — ABNORMAL LOW (ref 135–145)

## 2014-11-08 LAB — GLUCOSE, CAPILLARY: Glucose-Capillary: 99 mg/dL (ref 70–99)

## 2014-11-08 SURGERY — ESOPHAGOGASTRODUODENOSCOPY (EGD) WITH PROPOFOL
Anesthesia: Monitor Anesthesia Care

## 2014-11-08 MED ORDER — SODIUM CHLORIDE 0.9 % IV SOLN: INTRAVENOUS | Status: DC

## 2014-11-08 MED ORDER — PROPOFOL INFUSION 10 MG/ML OPTIME
INTRAVENOUS | Status: DC | PRN
  Administered 2014-11-08: 75 ug/kg/min via INTRAVENOUS

## 2014-11-08 MED ORDER — SODIUM CHLORIDE 0.9 % IV SOLN
INTRAVENOUS | Status: DC
  Administered 2014-11-08: 15:00:00 via INTRAVENOUS

## 2014-11-08 MED ORDER — GLYCOPYRROLATE 0.2 MG/ML IJ SOLN
INTRAMUSCULAR | Status: DC | PRN
  Administered 2014-11-08: 0.2 mg via INTRAVENOUS

## 2014-11-08 MED ORDER — FERROUS SULFATE 325 (65 FE) MG PO TABS: 325.0000 mg | ORAL_TABLET | Freq: Two times a day (BID) | ORAL | Status: DC

## 2014-11-08 MED ORDER — PROPOFOL 10 MG/ML IV BOLUS
INTRAVENOUS | Status: DC | PRN
  Administered 2014-11-08: 50 mg via INTRAVENOUS

## 2014-11-08 MED ORDER — LIDOCAINE HCL (CARDIAC) 20 MG/ML IV SOLN
INTRAVENOUS | Status: DC | PRN
Start: 2014-11-08 — End: 2014-11-08
  Administered 2014-11-08: 50 mg via INTRAVENOUS

## 2014-11-08 NOTE — Anesthesia Postprocedure Evaluation (Signed)
  Anesthesia Post-op Note  Patient: [name] Holmes  Procedure(s) Performed: Procedure(s): ESOPHAGOGASTRODUODENOSCOPY (EGD) WITH PROPOFOL (N/A)  Anesthesia type:MAC  Patient location: PACU/Endo  Post pain: Pain level controlled  Post assessment: Post-op Vital signs reviewed, Patient's Cardiovascular Status Stable, Respiratory Function Stable, Patent Airway and No signs of Nausea or vomiting  Post vital signs: Reviewed and stable  Last Vitals:  Filed Vitals:   11/08/14 1541  BP: 81/54  Pulse: 111  Temp: 36.3 C  Resp: 10    Level of consciousness: awake, alert  and patient cooperative  Complications: No apparent anesthesia complications

## 2014-11-08 NOTE — Progress Notes (Signed)
Subjective:   Awaiting endoscopy NPO . Asking for food No N/V. No SOB S Cr improving as noted below   Objective:  Vital signs in last 24 hours:  Temp:  [98 F (36.7 C)-98.3 F (36.8 C)] 98.2 F (36.8 C) (05/10 0751) Pulse Rate:  [69-78] 71 (05/10 1130) Resp:  [16-18] 18 (05/10 0751) BP: (123-152)/(61-88) 134/87 mmHg (05/10 0751) SpO2:  [98 %-100 %] 98 % (05/10 1130)  Weight change:  Filed Weights   11/03/14 0411  Weight: 51.909 kg (114 lb 7 oz)    Intake/Output: I/O last 3 completed shifts: In: 1925.3 [I.V.:1615.3; Blood:310] Out: -    Intake/Output this shift:  Total I/O In: 142.3 [I.V.:142.3] Out: -   Physical Exam: General: NAD,   Head: Normocephalic, atraumatic. Dry mucosal membranes  Eyes: Anicteric,   Neck: Supple, trachea midline  Lungs:  Clear to auscultation  Heart: Regular rate and rhythm  Abdomen:  Soft, nontender,   Extremities: no peripheral edema.  Neurologic: Nonfocal, moving all four extremities, alert and conversive.   Skin: No lesions       Basic Metabolic Panel:  Recent Labs Lab 11/04/14 0531 11/05/14 0435 11/06/14 0433 11/07/14 0421 11/08/14 0943  NA 136 135 133* 133* 133*  K 4.2 4.1 3.9 4.1 4.4  CL 104 108 108 108 105  CO2 25 22 21* 20* 22  GLUCOSE 76 112* 122* 84 79  BUN 63* 57* 52* 49* 47*  CREATININE 4.38* 3.80* 3.39* 3.13* 2.95*  CALCIUM 7.8* 7.5* 7.5* 7.6* 8.0*  PHOS  --   --  2.9  --   --     Liver Function Tests:  Recent Labs Lab 11/03/14 0439 11/06/14 0433  AST 33  --   ALT 17  --   ALKPHOS 72  --   BILITOT 0.6  --   PROT 7.0  --   ALBUMIN 2.5* 1.9*    Recent Labs Lab 11/03/14 0439  LIPASE 28   No results for input(s): AMMONIA in the last 168 hours.  CBC:  Recent Labs Lab 11/03/14 0439 11/04/14 0531 11/04/14 1951 11/05/14 0435 11/06/14 0433 11/07/14 0421 11/08/14 0546  WBC 9.0 7.2  --  7.1 7.2 8.7 10.9  NEUTROABS 6.9*  --   --   --   --   --   --   HGB 7.6* 7.0* 9.3* 7.8* 7.4* 7.0*  9.6*  HCT 23.8* 21.4* 28.2* 23.7* 22.6* 21.6* 28.2*  MCV 97.6 98.0  --  93.7 95.7 94.3 93.8  PLT 225 201  --  187 187 171 143*    Cardiac Enzymes:  Recent Labs Lab 11/03/14 0439  TROPONINI 0.04*    BNP: Invalid input(s): POCBNP  CBG:  Recent Labs Lab 11/08/14 4580  DXIPJA 25    Microbiology: Results for orders placed or performed during the hospital encounter of 11/03/14  Urine culture     Status: None   Collection Time: 11/03/14  4:39 AM  Result Value Ref Range Status   Specimen Description URINE, CATHETERIZED  Final   Special Requests NONE  Final   Culture ESCHERICHIA COLI >=100,000 COLONIES/mL  Final   Report Status 11/05/2014 FINAL  Final   Organism ID, Bacteria ESCHERICHIA COLI  Final      Susceptibility   Escherichia coli - MIC*    AMPICILLIN >=32 RESISTANT Resistant     CEFTAZIDIME <=4 SENSITIVE Sensitive     CEFAZOLIN <=1 SENSITIVE Sensitive     CEFTRIAXONE <=1 SENSITIVE Sensitive     CIPROFLOXACIN >=  4 RESISTANT Resistant     GENTAMICIN <=1 SENSITIVE Sensitive     IMIPENEM <=0.25 SENSITIVE Sensitive     TRIMETH/SULFA >=320 RESISTANT Resistant     CEFOXITIN <=4 SENSITIVE Sensitive     * ESCHERICHIA COLI >=100,000 COLONIES/mL  C difficile quick scan w PCR reflex [facility])     Status: None   Collection Time: 11/05/14  1:42 PM  Result Value Ref Range Status   C Diff antigen POSITIVE  Final   C Diff toxin NEGATIVE  Final   C Diff interpretation   Final    Negative for toxigenic C. difficile. Toxin gene and active toxin production not detected. May be a nontoxigenic strain of C. difficile bacteria present, lacking the ability to produce toxin.  Clostridium Difficile by PCR     Status: None   Collection Time: 11/05/14  1:42 PM  Result Value Ref Range Status   C difficile by pcr NEGATIVE NEGATIVE Final    Coagulation Studies:  Recent Labs  11/07/14 0421  LABPROT 14.4  INR 1.10    Urinalysis: No results for input(s): COLORURINE, LABSPEC, PHURINE,  GLUCOSEU, HGBUR, BILIRUBINUR, KETONESUR, PROTEINUR, UROBILINOGEN, NITRITE, LEUKOCYTESUR in the last 72 hours.  Invalid input(s): APPERANCEUR    Imaging: No results found.   Medications:   . sodium chloride 20 mL/hr at 11/08/14 0845   . sodium chloride   Intravenous Once  . cefTRIAXone (ROCEPHIN)  IV  1 g Intravenous Q24H  . cholecalciferol  1,000 Units Oral Daily  . febuxostat  40 mg Oral Daily  . ferrous sulfate  325 mg Oral Q breakfast  . folic acid  1 mg Oral Daily  . heparin  5,000 Units Subcutaneous 3 times per day  . levothyroxine  50 mcg Oral QAC breakfast  . multivitamin with minerals  1 tablet Oral Daily  . oxybutynin  5 mg Oral QHS  . pantoprazole  40 mg Oral Daily  . polyethylene glycol  17 g Oral Daily  . predniSONE  20 mg Oral Q breakfast  . thiamine  100 mg Oral Daily   acetaminophen, docusate sodium  Assessment/ Plan:  80 y.o. black female with chronic alcoholism, anemia, gout, hypertension admitted on 11/03/2014 For urinary tract infection and acute renal failure on chronic kidney disease stage III.   1. Acute renal failure on chronic kidney disease stage III with proteinuria:   Agree with IV fluids.  Renally dose all medications.  No acute indication for dialysis at this time.  Supportive care  2. Urinary Tract Infection:  - E Coli.  - Abx based on sensitivities as per primary team  3. Anemia: hemoglobin low. Status post 1 unit transfusion PRBC on 5/6.  With history of GI bleed and alcohol abuse.  - Continue to monitor hemoglobin.  - Scope today   LOS: 5 Darcel Frane 5/10/20161:47 PM

## 2014-11-08 NOTE — Progress Notes (Signed)
Pt. D/c'd to home. D/c instructions reviewed with pt. Pt. Has no further questions. Left via wheelchair.

## 2014-11-08 NOTE — Op Note (Signed)
[facility] Patient Name: [name] Holmes Procedure Date: 11/08/2014 3:12 PM MRN: [id number] Account #: [id number] Date of Birth: January 06, 1935 Admit Type: Inpatient Age: 80 Room: [facility] ENDO ROOM 3 Gender: Female Note Status: Finalized Procedure:         Upper GI endoscopy Indications:       Anemia Providers:         Lollie Sails, MD Referring MD:      Marguerita Merles, MD (Referring MD) Medicines:         Monitored Anesthesia Care Complications:     No immediate complications. Procedure:         Pre-Anesthesia Assessment:                    - ASA Grade Assessment: III - A patient with severe                     systemic disease.                    After obtaining informed consent, the endoscope was passed                     under direct vision. Throughout the procedure, the                     patient's blood pressure, pulse, and oxygen saturations                     were monitored continuously. The Olympus GIF-160 endoscope                     (S#. [id number]) was introduced through the mouth, and                     advanced to the third part of duodenum. The upper GI                     endoscopy was accomplished without difficulty. The patient                     tolerated the procedure well. Findings:      The examined esophagus was normal.      A small hiatus hernia was present.      The entire examined stomach was normal.      A deformity was found in the gastric antrum, with a "chamber"       immediately before enttering the duodenal bulb, without evidence of       avtive ulceration.      The examined duodenum was normal. Impression:        - Normal esophagus.                    - Small hiatus hernia.                    - Normal stomach.                    - Acquired deformity in the gastric antrum.                    - Normal examined duodenum.                    - No specimens collected. Recommendation:    - Return patient  to  hospital ward for ongoing care.                    - The findings and recommendations were discussed with the                     patient's primary physician.                    - Patient states she had a recent colonoscopy at [facility], I                     have not been able to access that. Heme negative to my DRE                     on admission. Further evaluation as outpatient or if there                     is clinical evidence of bleeding. Procedure Code(s): --- Professional ---                    [phone], Esophagogastroduodenoscopy, flexible, transoral;                     diagnostic, including collection of specimen(s) by                     brushing or washing, when performed (separate procedure) Diagnosis Code(s): --- Professional ---                    537.89, Other specified disorders of stomach and duodenum                    285.9, Anemia, unspecified                    553.3, Diaphragmatic hernia without mention of obstruction                     or gangrene CPT copyright 2014 [employer]. All rights reserved. The codes documented in this report are preliminary and upon coder review may  be revised to meet current compliance requirements. Lollie Sails, MD 11/08/2014 3:47:58 PM This report has been signed electronically. Number of Addenda: 0 Note Initiated On: 11/08/2014 3:12 PM      [facility]

## 2014-11-08 NOTE — Progress Notes (Signed)
Physical Therapy Treatment Patient Details Name: LUKE RIGSBEE MRN: [id number] DOB: [birth year] Today's Date: 11/08/2014    History of Present Illness 80 yo female with onset of UTI and cardiomegaly with acute chronic kidney disease and elevated troponin was admitted for elevated creatinine.  PMHx:  OA, HTN, anemia, gout, alcohol abuse    PT Comments    Pt currently NPO awaiting testing. Participates in bed exercises only; refuses out of bed due to fatigue, feeling extremely cold and huger. Continue to progress out of bed activities next session.   Follow Up Recommendations  SNF     Equipment Recommendations       Recommendations for Other Services       Precautions / Restrictions Precautions Precautions: Fall Precaution Comments: Nothing by mouth until endoscopy today Restrictions Weight Bearing Restrictions: No    Mobility  Bed Mobility               General bed mobility comments: Pt refuses out of bed; awaiting test  Transfers                    Ambulation/Gait                 Stairs            Wheelchair Mobility    Modified Rankin (Stroke Patients Only)       Balance                                    Cognition Arousal/Alertness: Awake/alert (Reports feeling "sleepy" and hungry) Behavior During Therapy: WFL for tasks assessed/performed Overall Cognitive Status: Within Functional Limits for tasks assessed     Current Attention Level: Focused   Following Commands: Follows one step commands consistently            Exercises General Exercises - Lower Extremity Ankle Circles/Pumps: AROM;Both;20 reps;Supine Quad Sets: Strengthening;Both;20 reps;Supine Gluteal Sets: Strengthening;Both;20 reps;Supine Short Arc Quad: AROM;Both;20 reps;Supine Heel Slides: Both;20 reps;Supine;AAROM Hip ABduction/ADduction: AROM;Both;20 reps;Supine Straight Leg Raises: AROM;Both;10 reps;Supine Other Exercises Other  Exercises: Adductor squeeze 20x supine    General Comments        Pertinent Vitals/Pain Pain Assessment: No/denies pain    Home Living                      Prior Function            PT Goals (current goals can now be found in the care plan section) Progress towards PT goals: Progressing toward goals    Frequency  Min 2X/week    PT Plan Current plan remains appropriate    Co-evaluation             End of Session   Activity Tolerance: Patient limited by fatigue Patient left: in bed;with call bell/phone within reach;with bed alarm set     Time: 1130-     Charges:  $Therapeutic Exercise: 8-22 mins                    G Codes:      Charlaine Dalton 11/08/2014, 11:54 AM

## 2014-11-08 NOTE — Discharge Summary (Signed)
[facility] [place] NAME: [name] Holmes    MR#:  [id number]  DATE OF BIRTH:  Oct 29, 1934  DATE OF ADMISSION:  11/03/2014 ADMITTING PHYSICIAN: Harrie Foreman, MD  DATE OF DISCHARGE: No discharge date for patient encounter.  PRIMARY CARE PHYSICIAN: Marguerita Merles, MD    ADMISSION DIAGNOSIS:  Acute cystitis without hematuria [N30.00] Sacral decubitus ulcer, stage II [L89.152] Acute renal failure, unspecified acute renal failure type [N17.9]  DISCHARGE DIAGNOSIS:  Principal Problem:   Acute on chronic kidney failure Active Problems:   Anemia   UTI (lower urinary tract infection)   SECONDARY DIAGNOSIS:   Past Medical History  Diagnosis Date  . Arthritis   . Hypertension   . Anemia   . Gout     HOSPITAL COURSE:   This is a 80 year old female admitted for urinary tract infection and acute on chronic kidney disease.  1. Urinary tract infection: IV rocephin for 5 days, E coli- sensitive to Rocephin per final culture report.   Will not need additional Abx on discharge. 2. Acute on chronic kidney disease:   Appreciate nephrology consult - continue gentle hydration. At this time do not require dialysis.  Slow gradual improvement in creatinine .  Can follow in office.  3. Anemia: This is likely multifactorial. Likely due to chronic kidney disease. She has been seen by hematology in the past and has a history of upper GI bleed. anemia of chronic disease as well as iron deficiency anemia.  a component of dilution, as pt is feeling weak and Hb -7- transfused total two unit 5/6& 9/16.  Seen by GI- Endoscopy today. Eat yesterday- so could not do Endoscopy. 4. Weakness:  likely secondary to a combination of the above factors.  PT suggested SNF- rehab, but pt refuse for that. will have to d/c Home with [facility] in next 1-2 days.  5. Hypothyroidism: normal TSH. Continue Synthroid  6. Gout:  continue Uloric as  well as allopurinol in renal dosing c/o pain- oral steroids.  7. History of alcohol abuse: no signs of withdrawal.  8. DVT prophylaxis: Heparin  DISCHARGE CONDITIONS:   stable  CONSULTS OBTAINED:  Treatment Team:  Lavonia Dana, MD Lollie Sails, MD  DRUG ALLERGIES:  No Known Allergies  DISCHARGE MEDICATIONS:   Current Discharge Medication List    START taking these medications   Details  !! ferrous sulfate (FERROUSUL) 325 (65 FE) MG tablet Take 1 tablet (325 mg total) by mouth 2 (two) times daily with a meal. Qty: 60 tablet, Refills: 0     !! - Potential duplicate medications found. Please discuss with provider.    CONTINUE these medications which have NOT CHANGED   Details  acetaminophen (TYLENOL) 325 MG tablet Take 650 mg by mouth every 6 (six) hours as needed.    allopurinol (ZYLOPRIM) 100 MG tablet Take 100 mg by mouth daily.    cholecalciferol (VITAMIN D) 1000 UNITS tablet Take 1,000 Units by mouth daily.    docusate sodium (COLACE) 100 MG capsule Take 100 mg by mouth daily as needed for mild constipation (at night).    febuxostat (ULORIC) 40 MG tablet Take 40 mg by mouth daily.    !! ferrous sulfate 325 (65 FE) MG tablet Take 325 mg by mouth daily with breakfast.    folic acid (FOLVITE) 1 MG tablet Take 1 mg by mouth daily.    furosemide (LASIX) 40 MG tablet Take 40 mg by mouth daily.  levothyroxine (SYNTHROID, LEVOTHROID) 50 MCG tablet Take 50 mcg by mouth daily before breakfast.    loperamide (IMODIUM A-D) 2 MG tablet Take 2 mg by mouth 4 (four) times daily as needed for diarrhea or loose stools.    magnesium oxide (MAG-OX) 400 MG tablet Take 400 mg by mouth daily.    omeprazole (PRILOSEC) 20 MG capsule Take 20 mg by mouth 2 (two) times daily before a meal.    oxybutynin (DITROPAN-XL) 5 MG 24 hr tablet Take 5 mg by mouth at bedtime.    polyethylene glycol (MIRALAX / GLYCOLAX) packet Take 17 g by mouth daily.    potassium chloride (K-DUR)  10 MEQ tablet Take 10 mEq by mouth daily.    thiamine 100 MG tablet Take 100 mg by mouth daily.     !! - Potential duplicate medications found. Please discuss with provider.       DISCHARGE INSTRUCTIONS:    Diet- Renal( low sodium, Low potassium. Activities- As tolerated.  If you experience worsening of your admission symptoms, develop shortness of breath, life threatening emergency, suicidal or homicidal thoughts you must seek medical attention immediately by calling 911 or calling your MD immediately  if symptoms less severe.  You Must read complete instructions/literature along with all the possible adverse reactions/side effects for all the Medicines you take and that have been prescribed to you. Take any new Medicines after you have completely understood and accept all the possible adverse reactions/side effects.   Please note  You were cared for by a hospitalist during your hospital stay. If you have any questions about your discharge medications or the care you received while you were in the hospital after you are discharged, you can call the unit and asked to speak with the hospitalist on call if the hospitalist that took care of you is not available. Once you are discharged, your primary care physician will handle any further medical issues. Please note that NO REFILLS for any discharge medications will be authorized once you are discharged, as it is imperative that you return to your primary care physician (or establish a relationship with a primary care physician if you do not have one) for your aftercare needs so that they can reassess your need for medications and monitor your lab values.    Today   CHIEF COMPLAINT:   Chief Complaint  Patient presents with  . Weakness  . Leg Pain    HISTORY OF PRESENT ILLNESS:  The patient called emergency medical services because she was too weak to get off of her couch. Her niece who is one of her caregivers states that the patient  does not frequently moved from that location. She was found and urine-soaked clothes by EMS. She also admits that she has fallen twice this month. She denies chest pain shortness of breath nausea vomiting or diarrhea. In the emergency room and she was found to have a urinary tract infection and a dramatic increase in her creatinine which prompted emergency department staff to call for admission.   VITAL SIGNS:  Blood pressure 134/87, pulse 71, temperature 98.2 F (36.8 C), temperature source Oral, resp. rate 18, height 5\' 4"  (1.626 m), weight 51.909 kg (114 lb 7 oz), SpO2 98 %.  I/O:   Intake/Output Summary (Last 24 hours) at 11/08/14 1312 Last data filed at 11/08/14 1307  Gross per 24 hour  Intake    932 ml  Output      0 ml  Net    932  ml    PHYSICAL EXAMINATION:  GENERAL: 80 y.o.-year-old patient lying in the bed with no acute distress.  EYES: Pupils equal, round, reactive to light and accommodation. No scleral icterus. Extraocular muscles intact. Conjunctiva pale. HEENT: Head atraumatic, normocephalic. Oropharynx and nasopharynx clear. Oral mucosa apears dry.  NECK: Supple, no jugular venous distention. No thyroid enlargement, no tenderness.  LUNGS: Normal breath sounds bilaterally, no wheezing, rales,rhonchi or crepitation. No use of accessory muscles of respiration.  CARDIOVASCULAR: S1, S2 normal. No murmurs, rubs, or gallops.  ABDOMEN: Soft, nontender, nondistended. Bowel sounds present. No organomegaly or mass.  EXTREMITIES:mild pedal edema, no cyanosis, or clubbing. Pulses intact in distal arteries. NEUROLOGIC: Cranial nerves II through XII are intact. Muscle strength 4/5 in all extremities. Sensation intact. Gait not checked.  PSYCHIATRIC: The patient is alert and oriented x 3.  SKIN: No obvious rash, lesion, or ulcer DATA REVIEW:   CBC  Recent Labs Lab 11/08/14 0546  WBC 10.9  HGB 9.6*  HCT 28.2*  PLT 143*    Chemistries   Recent Labs Lab  11/03/14 0439  11/08/14 0943  NA 138  < > 133*  K 4.7  < > 4.4  CL 102  < > 105  CO2 26  < > 22  GLUCOSE 89  < > 79  BUN 72*  < > 47*  CREATININE 5.10*  < > 2.95*  CALCIUM 8.9  < > 8.0*  AST 33  --   --   ALT 17  --   --   ALKPHOS 72  --   --   BILITOT 0.6  --   --   < > = values in this interval not displayed.  Cardiac Enzymes  Recent Labs Lab 11/03/14 0439  TROPONINI 0.04*    Microbiology Results  Results for orders placed or performed during the hospital encounter of 11/03/14  Urine culture     Status: None   Collection Time: 11/03/14  4:39 AM  Result Value Ref Range Status   Specimen Description URINE, CATHETERIZED  Final   Special Requests NONE  Final   Culture ESCHERICHIA COLI >=100,000 COLONIES/mL  Final   Report Status 11/05/2014 FINAL  Final   Organism ID, Bacteria ESCHERICHIA COLI  Final      Susceptibility   Escherichia coli - MIC*    AMPICILLIN >=32 RESISTANT Resistant     CEFTAZIDIME <=4 SENSITIVE Sensitive     CEFAZOLIN <=1 SENSITIVE Sensitive     CEFTRIAXONE <=1 SENSITIVE Sensitive     CIPROFLOXACIN >=4 RESISTANT Resistant     GENTAMICIN <=1 SENSITIVE Sensitive     IMIPENEM <=0.25 SENSITIVE Sensitive     TRIMETH/SULFA >=320 RESISTANT Resistant     CEFOXITIN <=4 SENSITIVE Sensitive     * ESCHERICHIA COLI >=100,000 COLONIES/mL  C difficile quick scan w PCR reflex [facility])     Status: None   Collection Time: 11/05/14  1:42 PM  Result Value Ref Range Status   C Diff antigen POSITIVE  Final   C Diff toxin NEGATIVE  Final   C Diff interpretation   Final    Negative for toxigenic C. difficile. Toxin gene and active toxin production not detected. May be a nontoxigenic strain of C. difficile bacteria present, lacking the ability to produce toxin.  Clostridium Difficile by PCR     Status: None   Collection Time: 11/05/14  1:42 PM  Result Value Ref Range Status   C difficile by pcr NEGATIVE NEGATIVE Final  Management plans discussed with the patient,  family and they are in agreement.  CODE STATUS: Full  TOTAL TIME TAKING CARE OF THIS PATIENT: 40 minutes.    Vaughan Basta M.D on 11/08/2014 at 1:12 PM  Between 7am to 6pm - Pager - [phone]  After 6pm go to [email or web address] - password [email or web address] [facility]  Office  [phone]  CC: Primary care physician; Marguerita Merles, MD

## 2014-11-08 NOTE — Consult Note (Signed)
Subjective: Patient seen for anemia she was unable to have her EGD yesterday due to eating some foods. He was rescheduled for today. Night she did well. She has had no vomiting or abdominal pain. His had no bowel movement.  Objective: Vital signs in last 24 hours: Temp:  [97 F (36.1 C)-98.3 F (36.8 C)] 97 F (36.1 C) (05/10 1426) Pulse Rate:  [69-73] 71 (05/10 1426) Resp:  [16-18] 16 (05/10 1426) BP: (123-138)/(61-87) 136/74 mmHg (05/10 1426) SpO2:  [98 %-100 %] 99 % (05/10 1426) Blood pressure 136/74, pulse 71, temperature 97 F (36.1 C), temperature source Tympanic, resp. rate 16, height 5\' 4"  (1.626 m), weight 51.909 kg (114 lb 7 oz), SpO2 99 %.   Intake/Output from previous day: 05/09 0701 - 05/10 0700 In: 789.7 [I.V.:479.7; Blood:310] Out: -   Intake/Output this shift: Total I/O In: 142.3 [I.V.:142.3] Out: -    General appearance:  Elderly-appearing, no acute distress. Resp:  Clear to auscultation bilateral Cardio:  Regular rate and rhythm without rub or gallop GI:  Soft nontender nondistended bowel sounds positive normoactive Extremities:  Trace to 1+ lower extremity edema   Lab Results: Results for orders placed or performed during the hospital encounter of 11/03/14 (from the past 24 hour(s))  CBC     Status: Abnormal   Collection Time: 11/08/14  5:46 AM  Result Value Ref Range   WBC 10.9 3.6 - 11.0 K/uL   RBC 3.01 (L) 3.80 - 5.20 MIL/uL   Hemoglobin 9.6 (L) 12.0 - 16.0 g/dL   HCT 28.2 (L) 35.0 - 47.0 %   MCV 93.8 80.0 - 100.0 fL   MCH 31.9 26.0 - 34.0 pg   MCHC 34.0 32.0 - 36.0 g/dL   RDW 16.8 (H) 11.5 - 14.5 %   Platelets 143 (L) 150 - 440 K/uL  Glucose, capillary     Status: None   Collection Time: 11/08/14  7:53 AM  Result Value Ref Range   Glucose-Capillary 99 70 - 99 mg/dL   Comment 1 Notify RN   Basic metabolic panel     Status: Abnormal   Collection Time: 11/08/14  9:43 AM  Result Value Ref Range   Sodium 133 (L) 135 - 145 mmol/L   Potassium  4.4 3.5 - 5.1 mmol/L   Chloride 105 101 - 111 mmol/L   CO2 22 22 - 32 mmol/L   Glucose, Bld 79 65 - 99 mg/dL   BUN 47 (H) 6 - 20 mg/dL   Creatinine, Ser 2.95 (H) 0.44 - 1.00 mg/dL   Calcium 8.0 (L) 8.9 - 10.3 mg/dL   GFR calc non Af Amer 14 (L) >60 mL/min   GFR calc Af Amer 17 (L) >60 mL/min   Anion gap 6 5 - 15      Recent Labs  11/06/14 0433 11/07/14 0421 11/08/14 0546  WBC 7.2 8.7 10.9  HGB 7.4* 7.0* 9.6*  HCT 22.6* 21.6* 28.2*  PLT 187 171 143*   BMET  Recent Labs  11/06/14 0433 11/07/14 0421 11/08/14 0943  NA 133* 133* 133*  K 3.9 4.1 4.4  CL 108 108 105  CO2 21* 20* 22  GLUCOSE 122* 84 79  BUN 52* 49* 47*  CREATININE 3.39* 3.13* 2.95*  CALCIUM 7.5* 7.6* 8.0*   LFT  Recent Labs  11/06/14 0433  ALBUMIN 1.9*   PT/INR  Recent Labs  11/07/14 0421  LABPROT 14.4  INR 1.10   Hepatitis Panel No results for input(s): HEPBSAG, HCVAB, HEPAIGM, HEPBIGM in  the last 72 hours. C-Diff No results for input(s): CDIFFTOX in the last 72 hours. No results for input(s): CDIFFPCR in the last 72 hours.   Studies/Results: No results found.  Scheduled Inpatient Medications:   . [MAR Hold] sodium chloride   Intravenous Once  . [MAR Hold] cefTRIAXone (ROCEPHIN)  IV  1 g Intravenous Q24H  . [MAR Hold] cholecalciferol  1,000 Units Oral Daily  . [MAR Hold] febuxostat  40 mg Oral Daily  . [MAR Hold] ferrous sulfate  325 mg Oral Q breakfast  . [MAR Hold] folic acid  1 mg Oral Daily  . [MAR Hold] heparin  5,000 Units Subcutaneous 3 times per day  . [MAR Hold] levothyroxine  50 mcg Oral QAC breakfast  . [MAR Hold] multivitamin with minerals  1 tablet Oral Daily  . [MAR Hold] oxybutynin  5 mg Oral QHS  . [MAR Hold] pantoprazole  40 mg Oral Daily  . [MAR Hold] polyethylene glycol  17 g Oral Daily  . [MAR Hold] predniSONE  20 mg Oral Q breakfast  . [MAR Hold] thiamine  100 mg Oral Daily    Continuous Inpatient Infusions:   . sodium chloride 20 mL/hr at 11/08/14 0845   . sodium chloride 50 mL/hr at 11/08/14 1433  . sodium chloride      PRN Inpatient Medications:  [MAR Hold] acetaminophen, [MAR Hold] docusate sodium  Miscellaneous:   Assessment:  1. Anemia in the setting of prior history of NSAID associated gastric ulcers.  Plan:  1. EGD today. I have discussed the risks benefits and complications of procedures to include not limited to bleeding, infection, perforation and the risk of sedation and the patient wishes to proceed. I have discussed the risks benefits and complications of procedures to include not limited to bleeding, infection, perforation and the risk of sedation and the patient wishes to proceed.  Lollie Sails MD 11/08/2014, 2:50 PM

## 2014-11-08 NOTE — Discharge Instructions (Signed)
Diet- low sodium. Regular follow up with a kidney doctor. Activities as tolerated.

## 2014-11-08 NOTE — Anesthesia Postprocedure Evaluation (Signed)
  Anesthesia Post-op Note  Patient: [name] Holmes  Procedure(s) Performed: Procedure(s): ESOPHAGOGASTRODUODENOSCOPY (EGD) WITH PROPOFOL (N/A)  Anesthesia type:MAC  Patient location: PACU  Post pain: Pain level controlled  Post assessment: Post-op Vital signs reviewed, Patient's Cardiovascular Status Stable, Respiratory Function Stable, Patent Airway and No signs of Nausea or vomiting  Post vital signs: Reviewed and stable  Last Vitals:  Filed Vitals:   11/08/14 1610  BP: 135/87  Pulse: 87  Temp:   Resp: 21    Level of consciousness: awake, alert  and patient cooperative  Complications: No apparent anesthesia complications

## 2014-11-08 NOTE — Transfer of Care (Signed)
Immediate Anesthesia Transfer of Care Note  Patient: [name] Holmes  Procedure(s) Performed: Procedure(s): ESOPHAGOGASTRODUODENOSCOPY (EGD) WITH PROPOFOL (N/A)  Patient Location: PACU/ENDO  Anesthesia Type:General  Level of Consciousness: Alert, Awake, Oriented  Airway & Oxygen Therapy: Patient Spontanous Breathing  Post-op Assessment: Report given to RN  Post vital signs: Reviewed and stable  Last Vitals:  Filed Vitals:   11/08/14 1541  BP: 81/54  Pulse: 111  Temp: 36.3 C  Resp: 10    Complications: No apparent anesthesia complications

## 2014-11-08 NOTE — Progress Notes (Signed)
[place] [facility] [place] NAME: [name] Holmes    MR#:  [id number]  DATE OF BIRTH:  1934-08-17  SUBJECTIVE: Hemoglobin dropping, 7.4 this morning. She feels tired but wants to go home. Kidney function improving   CHIEF COMPLAINT:   Chief Complaint  Patient presents with  . Weakness  . Leg Pain    REVIEW OF SYSTEMS:  CONSTITUTIONAL: No fever, positive for fatigue and generalized weakness.  EYES: No blurred or double vision.  EARS, NOSE, AND THROAT: No tinnitus or ear pain.  RESPIRATORY: No cough, shortness of breath, wheezing or hemoptysis.  CARDIOVASCULAR: No chest pain, orthopnea, edema.  GASTROINTESTINAL: No nausea, vomiting, diarrhea or abdominal pain.  GENITOURINARY: No dysuria, hematuria. Have incontinence. ENDOCRINE: No polyuria, nocturia,  HEMATOLOGY: No anemia, easy bruising or bleeding SKIN: No rash or lesion. MUSCULOSKELETAL: No joint pain or arthritis.   NEUROLOGIC: No tingling, numbness, weakness.  PSYCHIATRY: No anxiety or depression.    DRUG ALLERGIES:  No Known Allergies  VITALS:  Blood pressure 134/87, pulse 73, temperature 98.2 F (36.8 C), temperature source Oral, resp. rate 18, height 5\' 4"  (1.626 m), weight 51.909 kg (114 lb 7 oz), SpO2 100 %.  PHYSICAL EXAMINATION:  GENERAL:  80 y.o.-year-old patient lying in the bed with no acute distress.  EYES: Pupils equal, round, reactive to light and accommodation. No scleral icterus. Extraocular muscles intact. Conjunctiva pale. HEENT: Head atraumatic, normocephalic. Oropharynx and nasopharynx clear. Oral mucosa apears dry.  NECK:  Supple, no jugular venous distention. No thyroid enlargement, no tenderness.  LUNGS: Normal breath sounds bilaterally, no wheezing, rales,rhonchi or crepitation. No use of accessory muscles of respiration.  CARDIOVASCULAR: S1, S2 normal. No murmurs, rubs, or gallops.  ABDOMEN: Soft, nontender, nondistended. Bowel sounds present. No  organomegaly or mass.  EXTREMITIES:mild pedal edema, no cyanosis, or clubbing. Pulses intact in distal arteries. NEUROLOGIC: Cranial nerves II through XII are intact. Muscle strength 4/5 in all extremities. Sensation intact. Gait not checked.  PSYCHIATRIC: The patient is alert and oriented x 3.  SKIN: No obvious rash, lesion, or ulcer.   LABORATORY PANEL:   CBC  Recent Labs Lab 11/08/14 0546  WBC 10.9  HGB 9.6*  HCT 28.2*  PLT 143*   ------------------------------------------------------------------------------------------------------------------  Chemistries   Recent Labs Lab 11/03/14 0439  11/07/14 0421  NA 138  < > 133*  K 4.7  < > 4.1  CL 102  < > 108  CO2 26  < > 20*  GLUCOSE 89  < > 84  BUN 72*  < > 49*  CREATININE 5.10*  < > 3.13*  CALCIUM 8.9  < > 7.6*  AST 33  --   --   ALT 17  --   --   ALKPHOS 72  --   --   BILITOT 0.6  --   --   < > = values in this interval not displayed. ------------------------------------------------------------------------------------------------------------------  Cardiac Enzymes  Recent Labs Lab 11/03/14 0439  TROPONINI 0.04*   ------------------------------------------------------------------------------------------------------------------  RADIOLOGY:  No results found.  EKG:   Orders placed or performed during the hospital encounter of 11/03/14  . EKG 12-Lead  . EKG 12-Lead    ASSESSMENT AND PLAN:   This is a 80 year old female admitted for urinary tract infection and acute on chronic kidney disease.  1. Urinary tract infection: IV rocephin for 5 days, E coli- sensitive to Rocephin per final culture report.      Will not need additional Abx on  discharge. 2. Acute on chronic kidney disease:     Appreciate nephrology consult - continue gentle hydration. At this time do not require dialysis.   Slow gradual improvement in creatinine  .    Can follow in office.  3. Anemia: This is likely multifactorial. Likely due  to chronic kidney disease. She has been seen by hematology in the past and has a history of upper GI bleed.  anemia of chronic disease as well as iron deficiency anemia.   a component of dilution, as pt is feeling weak and Hb -7- transfused total two unit 5/6& 9/16.    Seen by GI- Endoscopy today. Eat yesterday- so could not do Endoscopy. 4. Weakness:   likely secondary to a combination of the above factors.   PT suggested SNF- rehab, but pt refuse for that.  will have to d/c Home with [facility]HA in next 1-2 days.  5. Hypothyroidism: normal TSH. Continue Synthroid  6. Gout:  continue Uloric as well as allopurinol in renal dosing  c/o pain- oral steroids.  7. History of alcohol abuse:  no signs of withdrawal.   8. DVT prophylaxis: Heparin  Will d/c today, if Nothing significant on endoscopy.  All the records are reviewed and case discussed with nurse. Management plans discussed with the patient, family and they are in agreement.  CODE STATUS: full  TOTAL TIME TAKING CARE OF THIS PATIENT: 35 minutes.   POSSIBLE D/C IN 1-2 DAYS, DEPENDING ON CLINICAL CONDITION. Waiting for final urine cx, and improvement in renal function and hemoglobin to safe range as she may need another unit of transfusion.   Vaughan Basta M.D on 11/08/2014 at 9:57 AM  Between 7am to 6pm - Pager - [phone]  After 6pm go to [email or web address] - password [email or web address] [facility]  Office  [phone]  CC: Primary care physician; Marguerita Merles, MD

## 2014-11-08 NOTE — Anesthesia Preprocedure Evaluation (Signed)
Anesthesia Evaluation  Patient identified by MRN, date of birth, ID band Patient awake    Reviewed: Allergy & Precautions, NPO status , Patient's Chart, lab work & pertinent test results  History of Anesthesia Complications Negative for: history of anesthetic complications  Airway Mallampati: II  TM Distance: >3 FB Neck ROM: Full    Dental no notable dental hx. (+) Upper Dentures, Partial Lower   Pulmonary neg pulmonary ROS, former smoker,  breath sounds clear to auscultation  Pulmonary exam normal       Cardiovascular hypertension, Pt. on medications negative cardio ROS Normal cardiovascular examRhythm:Regular Rate:Normal     Neuro/Psych negative neurological ROS  negative psych ROS   GI/Hepatic Neg liver ROS, GERD-  Medicated and Controlled,  Endo/Other  negative endocrine ROSHypothyroidism   Renal/GU negative Renal ROS  negative genitourinary   Musculoskeletal  (+) Arthritis -, Osteoarthritis,    Abdominal   Peds negative pediatric ROS (+)  Hematology  (+) anemia ,   Anesthesia Other Findings   Reproductive/Obstetrics negative OB ROS                             Anesthesia Physical Anesthesia Plan  ASA: III  Anesthesia Plan: MAC   Post-op Pain Management:    Induction: Intravenous  Airway Management Planned: Nasal Cannula  Additional Equipment:   Intra-op Plan:   Post-operative Plan:   Informed Consent: I have reviewed the patients History and Physical, chart, labs and discussed the procedure including the risks, benefits and alternatives for the proposed anesthesia with the patient or authorized representative who has indicated his/her understanding and acceptance.   Dental advisory given  Plan Discussed with: CRNA and Surgeon  Anesthesia Plan Comments:         Anesthesia Quick Evaluation

## 2014-11-08 NOTE — Progress Notes (Signed)
[name] Holmes remained NPO after MN, prepared for endo procedure today, consent has been obtained. Denies pain. Nursing continues to monitor and assist with ADLs.

## 2014-11-09 ENCOUNTER — Encounter: Payer: Self-pay | Admitting: Gastroenterology

## 2014-11-09 LAB — TYPE AND SCREEN
ABO/RH(D): O POS
ANTIBODY SCREEN: NEGATIVE
UNIT DIVISION: 0
Unit division: 0

## 2014-11-13 ENCOUNTER — Encounter: Payer: Self-pay | Admitting: Emergency Medicine

## 2014-11-13 ENCOUNTER — Emergency Department
Admission: EM | Admit: 2014-11-13 | Discharge: 2014-11-14 | Disposition: A | Discharge status: Home / Self Care | Payer: Medicare Other | Attending: Student | Admitting: Student

## 2014-11-13 DIAGNOSIS — I10 Essential (primary) hypertension: Secondary | ICD-10-CM | POA: Diagnosis not present

## 2014-11-13 DIAGNOSIS — R627 Adult failure to thrive: Secondary | ICD-10-CM | POA: Insufficient documentation

## 2014-11-13 DIAGNOSIS — Z79899 Other long term (current) drug therapy: Secondary | ICD-10-CM | POA: Diagnosis not present

## 2014-11-13 DIAGNOSIS — Z87891 Personal history of nicotine dependence: Secondary | ICD-10-CM | POA: Insufficient documentation

## 2014-11-13 DIAGNOSIS — R531 Weakness: Secondary | ICD-10-CM | POA: Diagnosis present

## 2014-11-13 LAB — COMPREHENSIVE METABOLIC PANEL
ALK PHOS: 45 U/L (ref 38–126)
ALT: 16 U/L (ref 14–54)
ANION GAP: 7 (ref 5–15)
AST: 30 U/L (ref 15–41)
Albumin: 2.3 g/dL — ABNORMAL LOW (ref 3.5–5.0)
BUN: 38 mg/dL — ABNORMAL HIGH (ref 6–20)
CHLORIDE: 105 mmol/L (ref 101–111)
CO2: 25 mmol/L (ref 22–32)
Calcium: 8 mg/dL — ABNORMAL LOW (ref 8.9–10.3)
Creatinine, Ser: 2.5 mg/dL — ABNORMAL HIGH (ref 0.44–1.00)
GFR, EST AFRICAN AMERICAN: 20 mL/min — AB (ref >60)
GFR, EST NON AFRICAN AMERICAN: 17 mL/min — AB (ref >60)
GLUCOSE: 91 mg/dL (ref 65–99)
Potassium: 4.2 mmol/L (ref 3.5–5.1)
SODIUM: 137 mmol/L (ref 135–145)
Total Bilirubin: 0.5 mg/dL (ref 0.3–1.2)
Total Protein: 6 g/dL — ABNORMAL LOW (ref 6.5–8.1)

## 2014-11-13 LAB — CBC WITH DIFFERENTIAL/PLATELET
Basophils Absolute: 0.1 10*3/uL (ref 0–0.1)
EOS ABS: 0.4 10*3/uL (ref 0–0.7)
Eosinophils Relative: 5 %
HEMATOCRIT: 31.3 % — AB (ref 35.0–47.0)
Hemoglobin: 10 g/dL — ABNORMAL LOW (ref 12.0–16.0)
LYMPHS ABS: 2.4 10*3/uL (ref 1.0–3.6)
Lymphocytes Relative: 25 %
MCH: 30.2 pg (ref 26.0–34.0)
MCHC: 32 g/dL (ref 32.0–36.0)
MCV: 94.3 fL (ref 80.0–100.0)
Monocytes Absolute: 0.9 10*3/uL (ref 0.2–0.9)
Neutro Abs: 5.9 10*3/uL (ref 1.4–6.5)
Neutrophils Relative %: 60 %
Platelets: 207 10*3/uL (ref 150–440)
RBC: 3.32 MIL/uL — AB (ref 3.80–5.20)
RDW: 16.4 % — AB (ref 11.5–14.5)
WBC: 9.7 10*3/uL (ref 3.6–11.0)

## 2014-11-13 LAB — URINALYSIS COMPLETE WITH MICROSCOPIC (ARMC ONLY)
BILIRUBIN URINE: NEGATIVE
Glucose, UA: NEGATIVE mg/dL
Hgb urine dipstick: NEGATIVE
KETONES UR: NEGATIVE mg/dL
NITRITE: NEGATIVE
Protein, ur: NEGATIVE mg/dL
Specific Gravity, Urine: 1.008 (ref 1.005–1.030)
pH: 6 (ref 5.0–8.0)

## 2014-11-13 LAB — TROPONIN I: Troponin I: <0.03 ng/mL (ref <0.031)

## 2014-11-13 MED ORDER — SODIUM CHLORIDE 0.9 % IV BOLUS (SEPSIS)
1000.0000 mL | Freq: Once | INTRAVENOUS | Status: AC
  Administered 2014-11-13: 1000 mL via INTRAVENOUS

## 2014-11-13 MED ORDER — CIPROFLOXACIN HCL 500 MG PO TABS
500.0000 mg | ORAL_TABLET | Freq: Two times a day (BID) | ORAL | Status: DC
  Administered 2014-11-13 – 2014-11-14 (×2): 500 mg via ORAL

## 2014-11-13 MED ORDER — FOSFOMYCIN TROMETHAMINE 3 G PO PACK
3.0000 g | PACK | Freq: Once | ORAL | Status: AC
  Administered 2014-11-13: 3 g via ORAL

## 2014-11-13 MED ORDER — CIPROFLOXACIN HCL 500 MG PO TABS
ORAL_TABLET | ORAL | Status: AC
  Administered 2014-11-13: 500 mg via ORAL
  Filled 2014-11-13: qty 1

## 2014-11-13 MED ORDER — FOSFOMYCIN TROMETHAMINE 3 G PO PACK
PACK | ORAL | Status: AC
  Administered 2014-11-13: 3 g via ORAL
  Filled 2014-11-13: qty 3

## 2014-11-13 NOTE — ED Notes (Signed)
Pt eating meal tray at this time

## 2014-11-13 NOTE — ED Provider Notes (Signed)
[facility] Emergency Department Provider Note  Time seen: 7:53 PM  I have reviewed the triage vital signs and the nursing notes.   HISTORY  Chief Complaint No chief complaint on file.    HPI [name] Holmes is a 80 y.o. female with a past medical history of hypertension, arthritis, hypothyroidism, gout who presents to the emergency department with weakness. Per EMS the patient was on the couch unable to leave the couch. [name] Holmes had feces and urine on it. Patient states she has not gotten off a couch for 3 days. Patient states when she tries to get off the couch her legs give out and she falls. Patient lives alone, but states her niece checks in on her several times a week. Patient was recently admitted on 11/03/14 for a urinary tract infection with acute renal insufficiency. Patient denies any complaints besides generalized weakness. Denies any focal weakness, numbness. Denies headache, fever, nausea/vomiting/diarrhea, abdominal pain, chest pain.     Past Medical History  Diagnosis Date  . Arthritis   . Hypertension   . Anemia   . Gout     Patient Active Problem List   Diagnosis Date Noted  . Acute on chronic kidney failure 11/03/2014  . UTI (lower urinary tract infection) 11/03/2014  . Anemia     Past Surgical History  Procedure Laterality Date  . Abdominal hysterectomy    . Esophagogastroduodenoscopy (egd) with propofol N/A 11/08/2014    Procedure: ESOPHAGOGASTRODUODENOSCOPY (EGD) WITH PROPOFOL;  Surgeon: Lollie Sails, MD;  Location: [facility] ENDOSCOPY;  Service: Endoscopy;  Laterality: N/A;    Current Outpatient Rx  Name  Route  Sig  Dispense  Refill  . acetaminophen (TYLENOL) 325 MG tablet   Oral   Take 650 mg by mouth every 6 (six) hours as needed.         Marland Kitchen allopurinol (ZYLOPRIM) 100 MG tablet   Oral   Take 100 mg by mouth daily.         . cholecalciferol (VITAMIN D) 1000 UNITS tablet   Oral   Take 1,000 Units by mouth daily.          Marland Kitchen docusate sodium (COLACE) 100 MG capsule   Oral   Take 100 mg by mouth daily as needed for mild constipation (at night).         . febuxostat (ULORIC) 40 MG tablet   Oral   Take 40 mg by mouth daily.         . ferrous sulfate (FERROUSUL) 325 (65 FE) MG tablet   Oral   Take 1 tablet (325 mg total) by mouth 2 (two) times daily with a meal.   60 tablet   0   . ferrous sulfate 325 (65 FE) MG tablet   Oral   Take 325 mg by mouth daily with breakfast.         . folic acid (FOLVITE) 1 MG tablet   Oral   Take 1 mg by mouth daily.         . furosemide (LASIX) 40 MG tablet   Oral   Take 40 mg by mouth daily.         Marland Kitchen levothyroxine (SYNTHROID, LEVOTHROID) 50 MCG tablet   Oral   Take 50 mcg by mouth daily before breakfast.         . loperamide (IMODIUM A-D) 2 MG tablet   Oral   Take 2 mg by mouth 4 (four) times daily as needed for diarrhea or  loose stools.         . magnesium oxide (MAG-OX) 400 MG tablet   Oral   Take 400 mg by mouth daily.         Marland Kitchen omeprazole (PRILOSEC) 20 MG capsule   Oral   Take 20 mg by mouth 2 (two) times daily before a meal.         . oxybutynin (DITROPAN-XL) 5 MG 24 hr tablet   Oral   Take 5 mg by mouth at bedtime.         . polyethylene glycol (MIRALAX / GLYCOLAX) packet   Oral   Take 17 g by mouth daily.         . potassium chloride (K-DUR) 10 MEQ tablet   Oral   Take 10 mEq by mouth daily.         Marland Kitchen thiamine 100 MG tablet   Oral   Take 100 mg by mouth daily.           Allergies Review of patient's allergies indicates no known allergies.  Family History  Problem Relation Age of Onset  . Hypertension Father     Social History History  Substance Use Topics  . Smoking status: Former Research scientist (life sciences)  . Smokeless tobacco: Not on file  . Alcohol Use: 10.8 oz/week    12 Cans of beer, 6 Shots of liquor per week    Review of Systems Constitutional: Negative for fever. Cardiovascular: Negative for chest  pain. Respiratory: Negative for shortness of breath. Gastrointestinal: Negative for abdominal pain, vomiting and diarrhea. Genitourinary: Negative for dysuria. Musculoskeletal: Negative for back pain, positive for buttock pain. Skin: Skin ulcers to patient's buttock. Neurological: Negative for headaches, focal weakness or numbness. Positive for generalized weakness.  10-point ROS otherwise negative.  ____________________________________________   PHYSICAL EXAM:  VITAL SIGNS: ED Triage Vitals  Enc Vitals Group     BP --      Pulse --      Resp --      Temp --      Temp src --      SpO2 --      Weight --      Height --      Head Cir --      Peak Flow --      Pain Score --      Pain Loc --      Pain Edu? --      Excl. in [place]? --     Constitutional: Alert and oriented. Very disheveled appearing smells strongly of urine and feces. Patient covered in her own urine and feces. Wearing a diaper which has not appeared to have been changed anytime recently. Patient has cockroaches literally crawling on her clothes. Eyes: Normal exam ENT   Head: Normocephalic and atraumatic.   Nose: No congestion/rhinnorhea.   Mouth/Throat: Dry mucous membranes Cardiovascular: Normal rate, regular rhythm. No murmurs, rubs, or gallops. Respiratory: Normal respiratory effort without tachypnea nor retractions. Breath sounds are clear Gastrointestinal: Soft and nontender. No distention.  Several sacral ulcers and skin tears. Musculoskeletal: Patient states generalized weakness with decreased movement in all extremities especially bilateral lower extremities. Strength appears equal bilaterally but weak bilaterally. Neurologic:  Normal speech and language. No gross focal neurologic deficits Skin:  Skin is warm, sacral ulcers to bilateral buttocks. Psychiatric: Mood and affect are normal.   ____________________________________________    EKG  EKG shows normal sinus rhythm at 81 bpm, normal  intervals. left bundle branch block. No  acute concerning ST changes.  ____________________________________________    ____________________________________________   INITIAL IMPRESSION / ASSESSMENT AND PLAN / ED COURSE  Pertinent labs & imaging results that were available during my care of the patient were reviewed by me and considered in my medical decision making (see chart for details).  Patient appears to be in very poor health, with very poor hygiene, with generalized weakness, too weak to leave her couch. Patient denies any focal weakness or numbness, denies any recent illness or pain complaints. Patient clearly is not capable of caring for herself at home. We'll check labs, IV hydrate and closely monitor in the ER.  Possible mild UTI, labs otherwise largely at baseline. Discussed with the patient and the patient's niece need for rehabilitation given her generalized weakness and unable to move off of her couch. Patient is agreeable to rehabilitation. Social work consult has been placed to hopefully find placement for the patient tomorrow.  ____________________________________________   FINAL CLINICAL IMPRESSION(S) / ED DIAGNOSES  Generalized weakness   Harvest Dark, MD 11/13/14 2159

## 2014-11-13 NOTE — ED Notes (Signed)
Family member at bedside stating she wants to go home and is unable to stay. MD called to bedside to speak with family.

## 2014-11-13 NOTE — ED Notes (Signed)
Pt cleaned with wipes for 30 minutes to remove stool, urine. Various cockroaches removed. Wounds noted to sacral area. See charting. Cleaned and sacral dressing placed.

## 2014-11-13 NOTE — ED Notes (Signed)
Pt presents to ER from home via EMS. Pt has been sitting on her couch for several days unable to get up. Pt is covered in various bugs, stool and urine. Pt alert and in NAD.

## 2014-11-13 NOTE — ED Notes (Signed)
Wounds on sacral area as follows: left buttock 2.2cm w x 2.4 cm l. Right buttock top wound 2.6cm w x 3 cm l. Lateral bottom is 6 cm x 7 cm. Additional wound noted to area 1 cm, 1/2 cm and 1 cm wound. Cock roach removed from wound.

## 2014-11-14 MED ORDER — CIPROFLOXACIN HCL 500 MG PO TABS
ORAL_TABLET | ORAL | Status: AC
  Administered 2014-11-14: 500 mg via ORAL
  Filled 2014-11-14: qty 1

## 2014-11-14 NOTE — Evaluation (Addendum)
Physical Therapy Evaluation Patient Details Name: [name] Holmes MRN: [id number] DOB: [birth year] Today's Date: 11/14/2014   History of Present Illness  presented to ER secondary to progressive weakness, unable to get off couch (covered in feces, urine and multiple cockroaches) x3 days.  Of note, patient with previous admission 5/5 for UTI; unable to care for self at home since discharge.  Clinical Impression  Upon evaluation, patient alert and oriented to basic information; follows one step commands.  Demonstrates limited insight into deficits and inability to care for self in home environment.  Bilat UE/LEs globally weak and deconditioned, strength only 3-/5 throughout.  Per chart review, multiple areas of ulceration over buttocks.  Patient currently requiring min/mod assist for bed mobility, sit/stand and short-distance gait (5' edge of bed) with RW.  Generally weak and unsteady, unsafe to attempt without assist; spontaneously sitting with gait trial due to fatigue.  Unable to demonstrate ability to indep care for self, mobilize household distances at this time. Would benefit from skilled PT to address above deficits and promote optimal return to PLOF;recommend transition to STR upon discharge from acute hospitalization. Patient aware of recommendations and in agreement with plan.     Follow Up Recommendations SNF    Equipment Recommendations  None recommended by PT    Recommendations for Other Services       Precautions / Restrictions Precautions Precautions: Fall Restrictions Weight Bearing Restrictions: No      Mobility  Bed Mobility Overal bed mobility: Needs Assistance Bed Mobility: Rolling Rolling: Supervision (heavy use of bedrails)   Supine to sit: Min assist Sit to supine: Mod assist   General bed mobility comments: assist for LE management  Transfers Overall transfer level: Needs assistance Equipment used: Rolling walker (2 wheeled) Transfers: Sit to/from  Stand Sit to Stand: Min assist;Mod assist            Ambulation/Gait Ambulation/Gait assistance: Min assist;Mod assist Ambulation Distance (Feet): 5 Feet Assistive device: Rolling walker (2 wheeled)       General Gait Details: short, shuffling steps towards head of bed; spontaneously sitting after 5 steps due to LE fatigue.  Very high risk for falls. Unable to tolerate distance necessary for household mobility  Stairs            Wheelchair Mobility    Modified Rankin (Stroke Patients Only)       Balance Overall balance assessment: Needs assistance Sitting-balance support: No upper extremity supported Sitting balance-Leahy Scale: Fair     Standing balance support: Bilateral upper extremity supported Standing balance-Leahy Scale: Poor                               Pertinent Vitals/Pain Pain Assessment: No/denies pain    Home Living Family/patient expects to be discharged to:: Private residence Living Arrangements: Alone Available Help at Discharge: Personal care attendant;Family Type of Home: House Home Access: Level entry     Home Layout: One level Home Equipment: [place] - 2 wheels;Cane - quad;Grab bars - toilet;Shower seat Additional Comments: reports she has an aide that comes "daily" and "when I need her" to assist with meal prep and household activities    Prior Function Level of Independence: Needs assistance   Gait / Transfers Assistance Needed: RW for household mobilization  ADL's / Homemaking Assistance Needed: caregiver by CNA every day and Rn 1-2 times a week for meds        Hand Dominance  Dominant Hand: Right    Extremity/Trunk Assessment   Upper Extremity Assessment: Generalized weakness (significant arthritic changes to bilat hands; strength at least 3/5 throughout)           Lower Extremity Assessment: Generalized weakness (R LE at least 3/5, L LE at least 3-/5; both globally weak and deconditioned)          Communication      Cognition Arousal/Alertness: Awake/alert Behavior During Therapy: WFL for tasks assessed/performed Overall Cognitive Status: Within Functional Limits for tasks assessed                      General Comments      Exercises Other Exercises Other Exercises: Rolling bilat, sup with heavy use of bedrails.  Patient with incontinent bowel/bladder episode, unaware; dep for hygiene, clothing and linen change. (8 minutes)      Assessment/Plan    PT Assessment Patient needs continued PT services  PT Diagnosis Difficulty walking;Generalized weakness   PT Problem List Decreased strength;Decreased range of motion;Decreased activity tolerance;Decreased balance;Decreased mobility;Decreased coordination;Decreased cognition;Decreased knowledge of use of DME;Decreased safety awareness;Decreased knowledge of precautions;Cardiopulmonary status limiting activity;Pain  PT Treatment Interventions Gait training;Stair training;Functional mobility training;Therapeutic activities;Therapeutic exercise;Balance training;Neuromuscular re-education;Cognitive remediation;Patient/family education;Wheelchair mobility training;DME instruction   PT Goals (Current goals can be found in the Care Plan section) Acute Rehab PT Goals Patient Stated Goal: "to get better" PT Goal Formulation: With patient Time For Goal Achievement: 11/28/14 Potential to Achieve Goals: Good    Frequency Min 2X/week   Barriers to discharge Decreased caregiver support;Other (comment)      Co-evaluation               End of Session   Activity Tolerance: Patient limited by fatigue Patient left: in bed;with call bell/phone within reach Nurse Communication: Mobility status (discharge recs)         Time: 3295-1884 PT Time Calculation (min) (ACUTE ONLY): 19 min   Charges:   PT Evaluation $Initial PT Evaluation Tier I: 1 Procedure PT Treatments $Therapeutic Activity: 8-22 mins   PT G Codes:        Kristen H. Owens Shark, PT, DPT 11/14/2014, 9:46 AM [phone]  Late Entry Addendum: Late entry for missed G-code. Based on review of the evaluation and goals by Tera Helper PT, DPT G Code: Mobility: Walking and Moving Around Modifiers: Current: CL                  Projected: CI   Murphy,Dorriea, PT, DPT 11/16/2014 10:12 AM

## 2014-11-14 NOTE — ED Notes (Signed)
Social worker reports to RN pt will be transferred to [facility]

## 2014-11-14 NOTE — Clinical Social Work Placement (Addendum)
   CLINICAL SOCIAL WORK PLACEMENT  NOTE  Date:  11/14/2014  Patient Details  Name: [name] Holmes MRN: [id number] Date of Birth: 17-May-1935  Clinical Social Work is seeking post-discharge placement for this patient at the [place] level of care (*CSW will initial, date and re-position this form in  chart as items are completed):  Yes   Patient/family provided with [place] Work Department's list of facilities offering this level of care within the geographic area requested by the patient (or if unable, by the patient's family).  Yes   Patient/family informed of their freedom to choose among providers that offer the needed level of care, that participate in Medicare, Medicaid or managed care program needed by the patient, have an available bed and are willing to accept the patient.  Yes   Patient/family informed of 's ownership interest in [facility] and [facility], as well as of the fact that they are under no obligation to receive care at these facilities.  PASRR submitted to EDS on       PASRR number received on       Existing PASRR number confirmed on 11/14/14     FL2 transmitted to all facilities in geographic area requested by pt/family on 11/14/14     FL2 transmitted to all facilities within larger geographic area on       Patient informed that his/her managed care company has contracts with or will negotiate with certain facilities, including the following:        Yes   Patient/family informed of bed offers received.  Patient chooses bed at [facility]     Physician recommends and patient chooses bed at      Patient to be transferred to [facility] on 11/14/14.  Patient to be transferred to facility by [facility]     Patient family notified on 11/14/14 of transfer.  Name of family member notified:  Carola Rhine     PHYSICIAN       Additional Comment: Per [name] with [facility] the patient is accepted to their facility bed 79A.  Patient can be transferred at this time for admission.     _______________________________________________ Chesley Noon A, LCSW 11/14/2014, 1:03 PM

## 2014-11-14 NOTE — ED Notes (Signed)
Pt to [facility] via ems

## 2014-11-14 NOTE — Clinical Social Work Note (Signed)
Clinical Social Work Assessment  Patient Details  Name: [name] Holmes MRN: [id number] Date of Birth: 12-Aug-1934  Date of referral:  11/14/14               Reason for consult:  Abuse/Neglect, Other (Comment Required) (Rehab Placement)                Permission sought to share information with:  Family Supports, Other (facility placement) Permission granted to share information::  Yes, Verbal Permission Granted  Name::     Carola Rhine, [address]  Agency::     Relationship::  neice, daughter  Sport and exercise psychologist Information:     Housing/Transportation Living arrangements for the past 2 months:  Tax adviser of Information:  Patient, Other (Comment Required) (neice) Patient Interpreter Needed:  None Criminal Activity/Legal Involvement Pertinent to Current Situation/Hospitalization:  No - Comment as needed Significant Relationships:  Other Family Members, Other(Comment), Adult Children ([facility], [facility], ) Lives with:  Self Do you feel safe going back to the place where you live?  Yes Need for family participation in patient care:  Yes (Comment)  Care giving concerns:  Patient is receiving home health from [place] agency and daily CNS from [facility].  It was reported the patient is seen by [place] 3 times a week and by [facility] aide daily potentially 3 time daily.  Patient has assistant equipment at home including 2 walkers, cane, shower bars, and other grab bars.  Patient was inpatient in the [facility] hospital on 11/03/2014 and at that time patient had a fall at home because of increased weakness.  Patient was discharged from [facility] on 11/08/2014 despite recommendations to refer for inpatient rehab services, home with home health services.  Patient returned to the ED on 11/13/2014 with chief complaint of weakness and inability to ambulate.  Patient is now agreeable to inpatient rehab placement.  Patient reports at one time in the past receiving  rehab services with [facility] and is willing to return if possible.  Patient was given a list of SNF resources for her review.  She gave verbal permission to this writer to speak with her daughter or niece about her medical needs.    CSW spoke with patient's niece who reports patient has a history of alcoholism and has not had a drink in a month.  She agrees with current recommendation for inpatient rehab services.  CSW informed her of the concerns with the care being given at home and the possibility of an APS being completed.     Social Worker assessment / plan:  Refer for inpatient Rehab Services   Employment status:  Retired Forensic scientist:  [employer]) PT Recommendations:  [place] / Referral to community resources:  Acute Rehab, [place]  Patient/Family's Response to care:  Patient and family agrees with recommendations  Patient/Family's Understanding of and Emotional Response to Diagnosis, Current Treatment, and Prognosis:  Patient and family agrees with plan  Emotional Assessment Appearance:  Disheveled Attitude/Demeanor/Rapport:  Other (cooperative, calm, arlert) Affect (typically observed):  Flat Orientation:  Oriented to Self, Oriented to Place, Oriented to  Time Alcohol / Substance use:  Alcohol Use Psych involvement (Current and /or in the community):  No (Comment)  Discharge Needs  Concerns to be addressed:  Basic Needs, Discharge Planning Concerns, Home Safety Concerns, Substance Abuse Concerns Readmission within the last 30 days:  Yes Current discharge risk:  Lives alone, Dependent with Mobility Barriers to Discharge:  No Barriers Identified   Edmund Hilda, LCSW 11/14/2014, 12:08 PM

## 2014-11-14 NOTE — Discharge Instructions (Signed)
Failure to Thrive, Adult Adult failure to thrive is a condition that some older people develop. People with this condition are able to do fewer and fewer activities over time. They may lose interest in being with friends or may not want to eat or drink. This is not a normal part of aging. Many things can cause this. Health problems, long-term disease, depression, bad eating habits, cognitive impairment, or disability may play a role in the development of this condition. Most of the time, it is important to treat whatever is causing failure to thrive. Sometimes, though, it might not be possible to treat the condition. The person could be nearing the end of life. Then, treatment could make the person suffer longer. CAUSES Sometimes, no specific cause can be found. Factors that have been linked to failure to thrive include:  Diseases and medical conditions, such as:  Cancer.  Diabetes.  Stomach and intestinal (gastrointestinal) problems.  Lung disease.  Liver disease.  Kidney disease.  Heart problems.  Thyroid disease.  Neurologic problems.  Vitamin deficiencies.  Disability. This may be the result of:  A broken hip.  A stroke.  Very bad arthritis.  Infections that last a long time.  A long recovery from a surgery.  Mental health issues.  Medicines.  Eating problems.  Medicine for certain conditions. These conditions include:  Parkinson's disease.  Seizure disorder.  Anxiety.  Pain.  High blood pressure.  Depression.  Infections. SYMPTOMS  Losing weight (more than 5% of total body weight).  Getting more tired than usual after an activity.  Having trouble getting up after sitting.  Not being hungry or thirsty.  Not getting out of bed.  Not wanting to do usual activities.  Being depressed.  Getting infections often.  Having bedsores.  Taking a long time to recover after an injury or a surgery.  Weakness. DIAGNOSIS A physical exam can help  a caregiver decide if someone has adult failure to thrive. This may include questions about the person's health presently and in the past. It also may include questions about behavior and mood, such as:  Has activity changed?  Does the person seem sad?  Are eating habits different? The caregiver may ask for a list of all medicines taken because certain medicines can lead to this condition. The list should include prescription and over-the-counter medicines. The caregiver will likely order some tests. These may include:  Blood tests to check for infection, certain diseases, deficiencies, hormone levels, malnutrition, or dehydration.  Urine tests to check for urinary tract infection or kidney failure.  Imaging tests. Examples are an X-ray, a computed tomography (CT) scan, and magnetic resonance imaging (MRI).  Hearing tests.  Vision tests.  Cognitivetests to check thinking ability.  Activity tests to see if the person can do basic tasks like bathing and dressing. There also are tests to check if someone can shop, cook, or move around safely. The caregiver will check if the person is eating enough healthy food. This may include:  Checking weight.  Having blood tested for cholesterol and protein levels.  Seeing if anything else might be making it hard to eat (tooth problems, poorly fitted dentures, trouble swallowing). The person may need to see a specialist to help with diagnosis or treatment. These specialists may include a speech therapist, physical therapist, occupational therapist, dietitian, or social worker.  TREATMENT Treatment for adult failure to thrive depends on the cause. Caregivers also must decide if a treatment has a good chance of working. It  often takes a team of caregivers to find the right treatment. Options may include:  Treatments to cure a disease that can cause adult failure to thrive.  Talk therapy or medicine to treat depression.  A better diet. Eating more  often, adding nutritional supplements between meals, or taking vitamins may be suggested. Sometimes, medicine is prescribed to boost appetite.  Medicine changes or stopping a medicine.  Physical therapy.  Moving to a place that offers more aid. HOME CARE INSTRUCTIONS What needs to be done at home varies from person to person. This will depend on what caused the condition and how it is treated. However, basic guidelines include:  Taking any medicine prescribed by the caregiver. Following the directions carefully is important.  Eating healthy foods. There should be enough calories in each meal. Ask the caregiver if vitamins or nutritional supplements should be taken between meals. Consider talking with a dietitian.  Exercising. Strength training is important. A physical therapist can help set up an exercise program that fits the person.  Making sure the person is safe at home.  Talking with caregivers about what should be done if the person can no longer make decisions for himself or herself. SEEK MEDICAL CARE IF:  There are any questions about medicines.  There are questions about the effects of treatment.  The person is not able to eat well.  The person is not able to move around.  The person feels very sad or hopeless. SEEK IMMEDIATE MEDICAL CARE IF:   The person has thoughts of ending his or her life.  The person cannot eat or drink.  The person does not get out of bed.  Staying at home is no longer safe.  The person has a fever. Document Released: 09/09/2011 Document Reviewed: 09/09/2011 [facility] Patient Information 2015 [place]. This information is not intended to replace advice given to you by your health care provider. Make sure you discuss any questions you have with your health care provider.

## 2014-11-14 NOTE — ED Provider Notes (Addendum)
Social work has arranged for patient to be transported to a nursing home. We will discharge to SNF LEVEL OF CARE  from the emergency department.  Lavonia Drafts, MD 11/14/14 1343  Lavonia Drafts, MD 11/14/14 [phone]

## 2014-11-14 NOTE — ED Notes (Signed)
Pt. Had urinated and had another loose bowel movement in bed.  Linens and robe changed.

## 2014-11-14 NOTE — ED Notes (Signed)
Resumed care from jill rn.  Pt alert.  Pt drinking orange juice.  Denies pain.

## 2014-11-14 NOTE — ED Notes (Signed)
Pt. Had bowel movement in bed.  Changed pt. and linens.

## 2014-11-16 NOTE — Progress Notes (Signed)
Late entry for missed G-code. Based on review of the evaluation and goals by Tera Helper, PT DPT.

## 2014-12-02 DIAGNOSIS — I129 Hypertensive chronic kidney disease with stage 1 through stage 4 chronic kidney disease, or unspecified chronic kidney disease: Secondary | ICD-10-CM | POA: Diagnosis not present

## 2014-12-02 DIAGNOSIS — Z87891 Personal history of nicotine dependence: Secondary | ICD-10-CM | POA: Diagnosis not present

## 2014-12-02 DIAGNOSIS — R809 Proteinuria, unspecified: Secondary | ICD-10-CM | POA: Diagnosis present

## 2014-12-02 DIAGNOSIS — Z79899 Other long term (current) drug therapy: Secondary | ICD-10-CM | POA: Insufficient documentation

## 2014-12-02 DIAGNOSIS — L89151 Pressure ulcer of sacral region, stage 1: Secondary | ICD-10-CM | POA: Insufficient documentation

## 2014-12-02 DIAGNOSIS — N189 Chronic kidney disease, unspecified: Secondary | ICD-10-CM | POA: Diagnosis present

## 2014-12-02 DIAGNOSIS — K5730 Diverticulosis of large intestine without perforation or abscess without bleeding: Secondary | ICD-10-CM | POA: Diagnosis present

## 2014-12-02 DIAGNOSIS — F1099 Alcohol use, unspecified with unspecified alcohol-induced disorder: Secondary | ICD-10-CM | POA: Diagnosis present

## 2014-12-02 DIAGNOSIS — N183 Chronic kidney disease, stage 3 (moderate): Secondary | ICD-10-CM | POA: Diagnosis present

## 2014-12-02 DIAGNOSIS — D120 Benign neoplasm of cecum: Secondary | ICD-10-CM | POA: Diagnosis present

## 2014-12-02 DIAGNOSIS — M109 Gout, unspecified: Secondary | ICD-10-CM | POA: Diagnosis present

## 2014-12-02 DIAGNOSIS — R197 Diarrhea, unspecified: Secondary | ICD-10-CM | POA: Diagnosis present

## 2014-12-02 DIAGNOSIS — K921 Melena: Principal | ICD-10-CM | POA: Diagnosis present

## 2014-12-02 DIAGNOSIS — E039 Hypothyroidism, unspecified: Secondary | ICD-10-CM | POA: Diagnosis present

## 2014-12-02 DIAGNOSIS — N179 Acute kidney failure, unspecified: Secondary | ICD-10-CM | POA: Diagnosis present

## 2014-12-02 DIAGNOSIS — R531 Weakness: Secondary | ICD-10-CM | POA: Diagnosis present

## 2014-12-02 DIAGNOSIS — D500 Iron deficiency anemia secondary to blood loss (chronic): Secondary | ICD-10-CM | POA: Diagnosis present

## 2014-12-02 DIAGNOSIS — K6289 Other specified diseases of anus and rectum: Secondary | ICD-10-CM | POA: Diagnosis present

## 2014-12-02 DIAGNOSIS — D62 Acute posthemorrhagic anemia: Secondary | ICD-10-CM | POA: Diagnosis present

## 2014-12-02 DIAGNOSIS — K2970 Gastritis, unspecified, without bleeding: Secondary | ICD-10-CM | POA: Diagnosis present

## 2014-12-03 ENCOUNTER — Emergency Department
Admission: EM | Admit: 2014-12-03 | Discharge: 2014-12-03 | Disposition: A | Discharge status: Home / Self Care | Payer: Medicare Other | Attending: Emergency Medicine | Admitting: Emergency Medicine

## 2014-12-03 DIAGNOSIS — L89151 Pressure ulcer of sacral region, stage 1: Secondary | ICD-10-CM

## 2014-12-03 NOTE — Discharge Instructions (Signed)
Skin Ulcer  A skin ulcer is an open sore that can be shallow or deep. Skin ulcers sometimes become infected and are difficult to treat. It may be 1 month or longer before real healing progress is made.  CAUSES    Injury.   Problems with the veins or arteries.   Diabetes.   Insect bites.   Bedsores.   Inflammatory conditions.  SYMPTOMS    Pain, redness, swelling, and tenderness around the ulcer.   Fever.   Bleeding from the ulcer.   Yellow or clear fluid coming from the ulcer.  DIAGNOSIS   There are many types of skin ulcers. Any open sores will be examined. Certain tests will be done to determine the kind of ulcer you have. The right treatment depends on the type of ulcer you have.  TREATMENT   Treatment is a long-term challenge. It may include:   Wearing an elastic wrap, compression stockings, or gel cast over the ulcer area.   Taking antibiotic medicines or putting antibiotic creams on the affected area if there is an infection.  HOME CARE INSTRUCTIONS   Put on your bandages (dressings), wraps, or casts over the ulcer as directed by your caregiver.   Change all dressings as directed by your caregiver.   Take all medicines as directed by your caregiver.   Keep the affected area clean and dry.   Avoid injuries to the affected area.   Eat a well-balanced, healthy diet that includes plenty of fruit and vegetables.   If you smoke, consider quitting or decreasing the amount of cigarettes you smoke.   Once the ulcer heals, get regular exercise as directed by your caregiver.   Work with your caregiver to make sure your blood pressure, cholesterol, and diabetes are well-controlled.   Keep your skin moisturized. Dry skin can crack and lead to skin ulcers.  SEEK IMMEDIATE MEDICAL CARE IF:    Your pain gets worse.   You have swelling, redness, or fluids around the ulcer.   You have chills.   You have a fever.  MAKE SURE YOU:    Understand these instructions.   Will watch your condition.   Will get  help right away if you are not doing well or get worse.  Document Released: 07/25/2004 Document Revised: 09/09/2011 Document Reviewed: 02/01/2011  [employer] Patient Information 2015 [employer]. This information is not intended to replace advice given to you by your health care provider. Make sure you discuss any questions you have with your health care provider.

## 2014-12-03 NOTE — ED Notes (Signed)
Applied barrier cream to open areas on pt's buttock per Owens Shark, MD. Covered with ABD pad. Pt now resting quietly on stretcher with no acute distress noted. Will continue to monitor.

## 2014-12-03 NOTE — ED Notes (Signed)
Pt in with co buttocks states x 3 weeks denies any injury, pt poor historian denies hemorrhoids.  Pt also co right ankle pain states has hx of gout.

## 2014-12-03 NOTE — ED Provider Notes (Signed)
[facility] Emergency Department Provider Note  ____________________________________________  Time seen: 4:30 AM  I have reviewed the triage vital signs and the nursing notes.   HISTORY  Chief Complaint Rectal Pain      HPI [name] Holmes is a 80 y.o. female presents with complaint of "sore on her bottom "3 weeks. Patient denies any fever. Patient states that she has a a deck comes to her house daily. Review of records reveal that last ED visit patient was discharged to a skilled nursing facility level of care however the patient stated "I didn't want to be there someone home"    Past Medical History  Diagnosis Date  . Arthritis   . Hypertension   . Anemia   . Gout     Patient Active Problem List   Diagnosis Date Noted  . Acute on chronic kidney failure 11/03/2014  . UTI (lower urinary tract infection) 11/03/2014  . Anemia     Past Surgical History  Procedure Laterality Date  . Abdominal hysterectomy    . Esophagogastroduodenoscopy (egd) with propofol N/A 11/08/2014    Procedure: ESOPHAGOGASTRODUODENOSCOPY (EGD) WITH PROPOFOL;  Surgeon: Lollie Sails, MD;  Location: [facility] ENDOSCOPY;  Service: Endoscopy;  Laterality: N/A;    Current Outpatient Rx  Name  Route  Sig  Dispense  Refill  . acetaminophen (TYLENOL) 325 MG tablet   Oral   Take 650 mg by mouth every 6 (six) hours as needed.         Marland Kitchen allopurinol (ZYLOPRIM) 100 MG tablet   Oral   Take 100 mg by mouth daily.         . cholecalciferol (VITAMIN D) 1000 UNITS tablet   Oral   Take 1,000 Units by mouth daily.         Marland Kitchen docusate sodium (COLACE) 100 MG capsule   Oral   Take 100 mg by mouth daily as needed for mild constipation (at night).         . febuxostat (ULORIC) 40 MG tablet   Oral   Take 40 mg by mouth daily.         . ferrous sulfate (FERROUSUL) 325 (65 FE) MG tablet   Oral   Take 1 tablet (325 mg total) by mouth 2 (two) times daily with a meal.   60  tablet   0   . ferrous sulfate 325 (65 FE) MG tablet   Oral   Take 325 mg by mouth daily with breakfast.         . folic acid (FOLVITE) 1 MG tablet   Oral   Take 1 mg by mouth daily.         . furosemide (LASIX) 40 MG tablet   Oral   Take 40 mg by mouth daily.         Marland Kitchen levothyroxine (SYNTHROID, LEVOTHROID) 50 MCG tablet   Oral   Take 50 mcg by mouth daily before breakfast.         . loperamide (IMODIUM A-D) 2 MG tablet   Oral   Take 2 mg by mouth 4 (four) times daily as needed for diarrhea or loose stools.         . magnesium oxide (MAG-OX) 400 MG tablet   Oral   Take 400 mg by mouth daily.         Marland Kitchen omeprazole (PRILOSEC) 20 MG capsule   Oral   Take 20 mg by mouth 2 (two) times daily before a meal.         .  oxybutynin (DITROPAN-XL) 5 MG 24 hr tablet   Oral   Take 5 mg by mouth at bedtime.         . polyethylene glycol (MIRALAX / GLYCOLAX) packet   Oral   Take 17 g by mouth daily.         . potassium chloride (K-DUR) 10 MEQ tablet   Oral   Take 10 mEq by mouth daily.         Marland Kitchen thiamine 100 MG tablet   Oral   Take 100 mg by mouth daily.           Allergies Review of patient's allergies indicates no known allergies.  Family History  Problem Relation Age of Onset  . Hypertension Father     Social History History  Substance Use Topics  . Smoking status: Former Research scientist (life sciences)  . Smokeless tobacco: Not on file  . Alcohol Use: 10.8 oz/week    12 Cans of beer, 6 Shots of liquor per week    Review of Systems  Constitutional: Negative for fever. Eyes: Negative for visual changes. ENT: Negative for sore throat. Cardiovascular: Negative for chest pain. Respiratory: Negative for shortness of breath. Gastrointestinal: Negative for abdominal pain, vomiting and diarrhea. Genitourinary: Negative for dysuria. Musculoskeletal: Negative for back pain. Skin: Positive Sore on buttocks Neurological: Negative for headaches, focal weakness or  numbness.   10-point ROS otherwise negative.  ____________________________________________   PHYSICAL EXAM:  VITAL SIGNS: ED Triage Vitals  Enc Vitals Group     BP 12/03/14 0107 99/71 mmHg     Pulse Rate 12/03/14 0107 84     Resp 12/03/14 0107 18     Temp 12/03/14 0107 98.3 F (36.8 C)     Temp Source 12/03/14 0107 Oral     SpO2 12/03/14 0107 100 %     Weight 12/03/14 0107 140 lb (63.504 kg)     Height 12/03/14 0107 5\' 4"  (1.626 m)     Head Cir --      Peak Flow --      Pain Score 12/03/14 0108 6     Pain Loc --      Pain Edu? --      Excl. in [place]? --      Constitutional: Alert and oriented. Well appearing and in no distress. Eyes: Conjunctivae are normal. PERRL. Normal extraocular movements. ENT   Head: Normocephalic and atraumatic.   Nose: No congestion/rhinnorhea.   Mouth/Throat: Mucous membranes are moist.   Neck: No stridor. Hematological/Lymphatic/Immunilogical: No cervical lymphadenopathy. Cardiovascular: Normal rate, regular rhythm. Normal and symmetric distal pulses are present in all extremities. No murmurs, rubs, or gallops. Respiratory: Normal respiratory effort without tachypnea nor retractions. Breath sounds are clear and equal bilaterally. No wheezes/rales/rhonchi. Gastrointestinal: Soft and nontender. No distention. There is no CVA tenderness. Genitourinary: deferred Musculoskeletal: Nontender with normal range of motion in all extremities. No joint effusions.  No lower extremity tenderness nor edema. Neurologic:  Normal speech and language. No gross focal neurologic deficits are appreciated. Speech is normal.  Skin: Stage I sacral decubitus ulcer noted. Psychiatric: Mood and affect are normal. Speech and behavior are normal. Patient exhibits appropriate insight and judgment.  ____________________________________________       INITIAL IMPRESSION / ASSESSMENT AND PLAN / ED COURSE  Pertinent labs & imaging results that were available  during my care of the patient were reviewed by me and considered in my medical decision making (see chart for details).  History of physical exam consistent with stage I  decubitus ulcer. Patient's also will be dressed here in the emergency department she is insistent on going home as such we'll give recommendations to the patient for appropriate care at home.  ____________________________________________   FINAL CLINICAL IMPRESSION(S) / ED DIAGNOSES  Final diagnoses:  Sacral decubitus ulcer, stage I      Gregor Hams, MD 12/03/14 [phone]

## 2014-12-03 NOTE — ED Notes (Signed)
Spoke with pts niece about pt having discharge paperwork, niece states she will be working on a ride for pt. Niece informed pt will be placed in the lobby until someone is able to pick her up.

## 2014-12-05 ENCOUNTER — Encounter: Payer: Self-pay | Admitting: Emergency Medicine

## 2014-12-05 ENCOUNTER — Inpatient Hospital Stay
Admission: EM | Admit: 2014-12-05 | Discharge: 2014-12-08 | DRG: 378 | Disposition: A | Discharge status: Home / Self Care | Payer: Medicare Other | Attending: Internal Medicine | Admitting: Internal Medicine

## 2014-12-05 DIAGNOSIS — L89151 Pressure ulcer of sacral region, stage 1: Secondary | ICD-10-CM

## 2014-12-05 DIAGNOSIS — N189 Chronic kidney disease, unspecified: Secondary | ICD-10-CM | POA: Diagnosis present

## 2014-12-05 DIAGNOSIS — R531 Weakness: Secondary | ICD-10-CM

## 2014-12-05 DIAGNOSIS — L8990 Pressure ulcer of unspecified site, unspecified stage: Secondary | ICD-10-CM | POA: Insufficient documentation

## 2014-12-05 DIAGNOSIS — D62 Acute posthemorrhagic anemia: Secondary | ICD-10-CM | POA: Diagnosis present

## 2014-12-05 DIAGNOSIS — D649 Anemia, unspecified: Secondary | ICD-10-CM | POA: Diagnosis present

## 2014-12-05 DIAGNOSIS — M109 Gout, unspecified: Secondary | ICD-10-CM | POA: Insufficient documentation

## 2014-12-05 DIAGNOSIS — K922 Gastrointestinal hemorrhage, unspecified: Secondary | ICD-10-CM

## 2014-12-05 DIAGNOSIS — N179 Acute kidney failure, unspecified: Secondary | ICD-10-CM | POA: Diagnosis present

## 2014-12-05 DIAGNOSIS — D120 Benign neoplasm of cecum: Secondary | ICD-10-CM | POA: Diagnosis present

## 2014-12-05 DIAGNOSIS — R197 Diarrhea, unspecified: Secondary | ICD-10-CM | POA: Diagnosis present

## 2014-12-05 DIAGNOSIS — R809 Proteinuria, unspecified: Secondary | ICD-10-CM | POA: Diagnosis present

## 2014-12-05 DIAGNOSIS — F1099 Alcohol use, unspecified with unspecified alcohol-induced disorder: Secondary | ICD-10-CM | POA: Diagnosis present

## 2014-12-05 DIAGNOSIS — D500 Iron deficiency anemia secondary to blood loss (chronic): Secondary | ICD-10-CM | POA: Diagnosis present

## 2014-12-05 DIAGNOSIS — Z87891 Personal history of nicotine dependence: Secondary | ICD-10-CM | POA: Diagnosis not present

## 2014-12-05 DIAGNOSIS — K5730 Diverticulosis of large intestine without perforation or abscess without bleeding: Secondary | ICD-10-CM | POA: Diagnosis present

## 2014-12-05 DIAGNOSIS — I10 Essential (primary) hypertension: Secondary | ICD-10-CM | POA: Insufficient documentation

## 2014-12-05 DIAGNOSIS — K2970 Gastritis, unspecified, without bleeding: Secondary | ICD-10-CM | POA: Diagnosis present

## 2014-12-05 DIAGNOSIS — E039 Hypothyroidism, unspecified: Secondary | ICD-10-CM | POA: Diagnosis present

## 2014-12-05 DIAGNOSIS — N183 Chronic kidney disease, stage 3 (moderate): Secondary | ICD-10-CM | POA: Diagnosis present

## 2014-12-05 DIAGNOSIS — F1011 Alcohol abuse, in remission: Secondary | ICD-10-CM | POA: Insufficient documentation

## 2014-12-05 DIAGNOSIS — I129 Hypertensive chronic kidney disease with stage 1 through stage 4 chronic kidney disease, or unspecified chronic kidney disease: Secondary | ICD-10-CM | POA: Diagnosis present

## 2014-12-05 DIAGNOSIS — K921 Melena: Secondary | ICD-10-CM | POA: Diagnosis present

## 2014-12-05 HISTORY — DX: Alcohol abuse, uncomplicated: F10.10

## 2014-12-05 LAB — CBC WITH DIFFERENTIAL/PLATELET
BASOS ABS: 0.1 10*3/uL (ref 0–0.1)
Basophils Relative: 1 %
EOS ABS: 0.3 10*3/uL (ref 0–0.7)
Eosinophils Relative: 4 %
HEMATOCRIT: 24.4 % — AB (ref 35.0–47.0)
Hemoglobin: 7.9 g/dL — ABNORMAL LOW (ref 12.0–16.0)
LYMPHS PCT: 24 %
Lymphs Abs: 1.8 10*3/uL (ref 1.0–3.6)
MCH: 30.3 pg (ref 26.0–34.0)
MCHC: 32.3 g/dL (ref 32.0–36.0)
MCV: 93.7 fL (ref 80.0–100.0)
MONOS PCT: 9 %
Monocytes Absolute: 0.7 10*3/uL (ref 0.2–0.9)
NEUTROS PCT: 62 %
Neutro Abs: 4.5 10*3/uL (ref 1.4–6.5)
Platelets: 223 10*3/uL (ref 150–440)
RBC: 2.6 MIL/uL — ABNORMAL LOW (ref 3.80–5.20)
RDW: 16.8 % — AB (ref 11.5–14.5)
WBC: 7.3 10*3/uL (ref 3.6–11.0)

## 2014-12-05 LAB — TROPONIN I

## 2014-12-05 LAB — COMPREHENSIVE METABOLIC PANEL
ALBUMIN: 2.5 g/dL — AB (ref 3.5–5.0)
ALT: 16 U/L (ref 14–54)
AST: 41 U/L (ref 15–41)
Alkaline Phosphatase: 67 U/L (ref 38–126)
Anion gap: 12 (ref 5–15)
BUN: 36 mg/dL — ABNORMAL HIGH (ref 6–20)
CO2: 24 mmol/L (ref 22–32)
Calcium: 8.3 mg/dL — ABNORMAL LOW (ref 8.9–10.3)
Chloride: 103 mmol/L (ref 101–111)
Creatinine, Ser: 2.78 mg/dL — ABNORMAL HIGH (ref 0.44–1.00)
GFR calc Af Amer: 18 mL/min — ABNORMAL LOW (ref >60)
GFR calc non Af Amer: 15 mL/min — ABNORMAL LOW (ref >60)
Glucose, Bld: 127 mg/dL — ABNORMAL HIGH (ref 65–99)
POTASSIUM: 4.4 mmol/L (ref 3.5–5.1)
SODIUM: 139 mmol/L (ref 135–145)
Total Bilirubin: 0.6 mg/dL (ref 0.3–1.2)
Total Protein: 6.1 g/dL — ABNORMAL LOW (ref 6.5–8.1)

## 2014-12-05 LAB — PREPARE RBC (CROSSMATCH)

## 2014-12-05 MED ORDER — LEVOTHYROXINE SODIUM 50 MCG PO TABS
50.0000 ug | ORAL_TABLET | Freq: Every morning | ORAL | Status: DC
  Administered 2014-12-06 – 2014-12-08 (×3): 50 ug via ORAL
  Filled 2014-12-05 (×3): qty 1

## 2014-12-05 MED ORDER — LOPERAMIDE HCL 2 MG PO TABS
2.0000 mg | ORAL_TABLET | Freq: Four times a day (QID) | ORAL | Status: DC | PRN
  Filled 2014-12-05: qty 1

## 2014-12-05 MED ORDER — SODIUM CHLORIDE 0.9 % IV SOLN
Freq: Once | INTRAVENOUS | Status: AC
  Administered 2014-12-05: 22:00:00 via INTRAVENOUS

## 2014-12-05 MED ORDER — OXYBUTYNIN CHLORIDE ER 5 MG PO TB24
5.0000 mg | ORAL_TABLET | Freq: Every day | ORAL | Status: DC
  Administered 2014-12-05 – 2014-12-07 (×3): 5 mg via ORAL
  Filled 2014-12-05 (×3): qty 1

## 2014-12-05 MED ORDER — ALLOPURINOL 100 MG PO TABS
100.0000 mg | ORAL_TABLET | Freq: Every day | ORAL | Status: DC
Start: 2014-12-05 — End: 2014-12-08
  Administered 2014-12-05 – 2014-12-08 (×4): 100 mg via ORAL
  Filled 2014-12-05 (×4): qty 1

## 2014-12-05 MED ORDER — MAGNESIUM OXIDE 400 MG PO TABS
400.0000 mg | ORAL_TABLET | Freq: Every day | ORAL | Status: DC
  Administered 2014-12-05 – 2014-12-08 (×4): 400 mg via ORAL
  Filled 2014-12-05 (×9): qty 1

## 2014-12-05 MED ORDER — SODIUM CHLORIDE 0.9 % IV SOLN
80.0000 mg | Freq: Once | INTRAVENOUS | Status: AC
  Administered 2014-12-05: 80 mg via INTRAVENOUS
  Filled 2014-12-05: qty 80

## 2014-12-05 MED ORDER — SODIUM CHLORIDE 0.9 % IV SOLN
8.0000 mg/h | INTRAVENOUS | Status: AC
  Administered 2014-12-06 – 2014-12-07 (×5): 8 mg/h via INTRAVENOUS
  Filled 2014-12-05 (×5): qty 80

## 2014-12-05 MED ORDER — ACETAMINOPHEN 325 MG PO TABS: 650.0000 mg | ORAL_TABLET | Freq: Four times a day (QID) | ORAL | Status: DC | PRN

## 2014-12-05 MED ORDER — SODIUM CHLORIDE 0.9 % IJ SOLN
3.0000 mL | INTRAMUSCULAR | Status: DC | PRN
  Administered 2014-12-06: 3 mL via INTRAVENOUS
  Filled 2014-12-05: qty 10

## 2014-12-05 MED ORDER — FUROSEMIDE 20 MG PO TABS
40.0000 mg | ORAL_TABLET | Freq: Every morning | ORAL | Status: DC
  Administered 2014-12-06 – 2014-12-07 (×2): 40 mg via ORAL
  Filled 2014-12-05 (×2): qty 2

## 2014-12-05 MED ORDER — PANTOPRAZOLE SODIUM 40 MG IV SOLR: 40.0000 mg | Freq: Two times a day (BID) | INTRAVENOUS | Status: DC

## 2014-12-05 MED ORDER — PANTOPRAZOLE SODIUM 40 MG PO TBEC: 40.0000 mg | DELAYED_RELEASE_TABLET | Freq: Every day | ORAL | Status: DC

## 2014-12-05 MED ORDER — ONDANSETRON HCL 4 MG/2ML IJ SOLN: 4.0000 mg | Freq: Four times a day (QID) | INTRAMUSCULAR | Status: DC | PRN

## 2014-12-05 MED ORDER — VITAMIN D 1000 UNITS PO TABS
1000.0000 [IU] | ORAL_TABLET | Freq: Every day | ORAL | Status: DC
  Administered 2014-12-05 – 2014-12-08 (×4): 1000 [IU] via ORAL
  Filled 2014-12-05 (×4): qty 1

## 2014-12-05 MED ORDER — DOCUSATE SODIUM 100 MG PO CAPS: 100.0000 mg | ORAL_CAPSULE | Freq: Every day | ORAL | Status: DC | PRN

## 2014-12-05 MED ORDER — FERROUS SULFATE 325 (65 FE) MG PO TABS: 325.0000 mg | ORAL_TABLET | Freq: Two times a day (BID) | ORAL | Status: DC

## 2014-12-05 MED ORDER — SODIUM CHLORIDE 0.9 % IJ SOLN
3.0000 mL | Freq: Two times a day (BID) | INTRAMUSCULAR | Status: DC
  Administered 2014-12-05 – 2014-12-08 (×4): 3 mL via INTRAVENOUS

## 2014-12-05 MED ORDER — FOLIC ACID 1 MG PO TABS
1.0000 mg | ORAL_TABLET | Freq: Every day | ORAL | Status: DC
  Administered 2014-12-06 – 2014-12-08 (×3): 1 mg via ORAL
  Filled 2014-12-05 (×3): qty 1

## 2014-12-05 MED ORDER — SODIUM CHLORIDE 0.9 % IV BOLUS (SEPSIS)
500.0000 mL | INTRAVENOUS | Status: AC
Start: 2014-12-05 — End: 2014-12-05
  Administered 2014-12-05: 500 mL via INTRAVENOUS

## 2014-12-05 MED ORDER — FEBUXOSTAT 40 MG PO TABS
40.0000 mg | ORAL_TABLET | Freq: Every day | ORAL | Status: DC
  Administered 2014-12-06 – 2014-12-07 (×2): 40 mg via ORAL
  Filled 2014-12-05 (×5): qty 1

## 2014-12-05 MED ORDER — POTASSIUM CHLORIDE ER 10 MEQ PO TBCR
10.0000 meq | EXTENDED_RELEASE_TABLET | Freq: Every day | ORAL | Status: DC
  Administered 2014-12-05 – 2014-12-08 (×4): 10 meq via ORAL
  Filled 2014-12-05 (×8): qty 1

## 2014-12-05 MED ORDER — VITAMIN B-1 100 MG PO TABS
100.0000 mg | ORAL_TABLET | Freq: Every day | ORAL | Status: DC
  Administered 2014-12-05 – 2014-12-07 (×3): 100 mg via ORAL
  Filled 2014-12-05 (×8): qty 1

## 2014-12-05 MED ORDER — HYDROCODONE-ACETAMINOPHEN 5-325 MG PO TABS
1.0000 | ORAL_TABLET | ORAL | Status: DC | PRN
  Administered 2014-12-08: 01:00:00 1 via ORAL
  Filled 2014-12-05: qty 1

## 2014-12-05 MED ORDER — FERROUS SULFATE 325 (65 FE) MG PO TABS: 325.0000 mg | ORAL_TABLET | Freq: Every day | ORAL | Status: DC

## 2014-12-05 MED ORDER — SODIUM CHLORIDE 0.9 % IV SOLN: 250.0000 mL | INTRAVENOUS | Status: DC | PRN

## 2014-12-05 MED ORDER — ONDANSETRON HCL 4 MG PO TABS: 4.0000 mg | ORAL_TABLET | Freq: Four times a day (QID) | ORAL | Status: DC | PRN

## 2014-12-05 MED ORDER — POLYETHYLENE GLYCOL 3350 17 G PO PACK
17.0000 g | PACK | Freq: Every day | ORAL | Status: DC
  Administered 2014-12-06 – 2014-12-07 (×2): 17 g via ORAL
  Filled 2014-12-05 (×2): qty 1

## 2014-12-05 MED ORDER — ALUM & MAG HYDROXIDE-SIMETH 200-200-20 MG/5ML PO SUSP: 30.0000 mL | Freq: Four times a day (QID) | ORAL | Status: DC | PRN

## 2014-12-05 NOTE — ED Notes (Signed)
Patient to ED via EMS with c/o watery stools causing her to have red irritated skin to rectal area. Patient immediately wanting to be changed on arrival to room, patient cleansed of large amount of dark [name] Holmes loose stool. Skin to rectal area noted to be excoriated in places with redness noted.

## 2014-12-05 NOTE — Progress Notes (Signed)
Spoke with Dr Lavetta Nielsen regarding clarification of protonix , ferrous sulfate, Immodium orders. MD verbalized to d/c both orders of ferrous sulfate, immodium and protonix po and protonix IV push.

## 2014-12-05 NOTE — Plan of Care (Signed)
Problem: Discharge Progression Outcomes Goal: Discharge plan in place and appropriate Outcome: Progressing Individualization: 1. Lives in apartment near her dgt; reports neighbors close by. 2. Reports 3 or so falls a home in last 6 months- HIGH risk for fall. 3. Reports has home health aide who assists her with personal care/runs errands/uses pill box to take meds. 4.  Reports wears depends due to urinary/stool incontinence.  Medical history includes hypothyroidism which is controlled by home meds.

## 2014-12-05 NOTE — H&P (Addendum)
[facility] NAME: [name] Holmes    MR#:  [id number]  DATE OF BIRTH:  [birth year]  DATE OF ADMISSION:  12/05/2014  PRIMARY CARE PHYSICIAN: Marguerita Merles, MD   REQUESTING/REFERRING PHYSICIAN:  Enid Skeens  CHIEF COMPLAINT:   Chief Complaint  Patient presents with  . Diarrhea  . Rash    HISTORY OF PRESENT ILLNESS: [name] Holmes  is a 80 y.o. female with a known history of alcohol abuse in the past who has had multiple visits with complain a weakness in the emergency room. Patient's recently was arranged to go to skilled nursing facility from the emergency room. Patient was therefore brief amount of time and then left. She comes back to the emergency room today complaining of generalized weakness. Also states that her gout flared up yesterday.  She also reports that she's had dark color stools since this morning. Patient is hemoglobin was in the 10 range during her recent visit few days ago and is now 7.9. She does report history of having anemia in the past requiring transfusion in 2014. She denies any chest pains or shortness of breath. PAST MEDICAL HISTORY:   Past Medical History  Diagnosis Date  . Arthritis   . Hypertension   . Anemia   . Gout   . ETOH abuse     PAST SURGICAL HISTORY:  Past Surgical History  Procedure Laterality Date  . Abdominal hysterectomy    . Esophagogastroduodenoscopy (egd) with propofol N/A 11/08/2014    Procedure: ESOPHAGOGASTRODUODENOSCOPY (EGD) WITH PROPOFOL;  Surgeon: Lollie Sails, MD;  Location: [facility] ENDOSCOPY;  Service: Endoscopy;  Laterality: N/A;    SOCIAL HISTORY:  History  Substance Use Topics  . Smoking status: Former Research scientist (life sciences)  . Smokeless tobacco: Not on file  . Alcohol Use: 7.2 oz/week    6 Cans of beer, 6 Shots of liquor per week    FAMILY HISTORY:  Family History  Problem Relation Age of Onset  . Hypertension Father     DRUG ALLERGIES: No Known Allergies  REVIEW OF  SYSTEMS:   CONSTITUTIONAL: No fever, positive fatigue positive weakness.  EYES: No blurred or double vision.  EARS, NOSE, AND THROAT: No tinnitus or ear pain.  RESPIRATORY: No cough, shortness of breath, wheezing or hemoptysis.  CARDIOVASCULAR: No chest pain, orthopnea, edema.  GASTROINTESTINAL: No nausea, vomiting, diarrhea or abdominal pain.  GENITOURINARY: No dysuria, hematuria.  ENDOCRINE: No polyuria, nocturia,  HEMATOLOGY: Positive anemia, easy bruising or bleeding SKIN: No rash or lesion. MUSCULOSKELETAL: No joint pain or arthritis.  Gout NEUROLOGIC: No tingling, numbness, weakness.  PSYCHIATRY: No anxiety or depression.   MEDICATIONS AT HOME:  Prior to Admission medications   Medication Sig Start Date End Date Taking? Authorizing Provider  acetaminophen (TYLENOL) 325 MG tablet Take 650 mg by mouth every 6 (six) hours as needed.    Historical Provider, MD  allopurinol (ZYLOPRIM) 100 MG tablet Take 100 mg by mouth daily.    Historical Provider, MD  cholecalciferol (VITAMIN D) 1000 UNITS tablet Take 1,000 Units by mouth daily.    Historical Provider, MD  docusate sodium (COLACE) 100 MG capsule Take 100 mg by mouth daily as needed for mild constipation (at night).    Historical Provider, MD  febuxostat (ULORIC) 40 MG tablet Take 40 mg by mouth daily.    Historical Provider, MD  ferrous sulfate (FERROUSUL) 325 (65 FE) MG tablet Take 1 tablet (325 mg total) by mouth 2 (two) times daily  with a meal. 11/08/14   Vaughan Basta, MD  ferrous sulfate 325 (65 FE) MG tablet Take 325 mg by mouth daily with breakfast.    Historical Provider, MD  folic acid (FOLVITE) 1 MG tablet Take 1 mg by mouth daily.    Historical Provider, MD  furosemide (LASIX) 40 MG tablet Take 40 mg by mouth daily.    Historical Provider, MD  levothyroxine (SYNTHROID, LEVOTHROID) 50 MCG tablet Take 50 mcg by mouth daily before breakfast.    Historical Provider, MD  loperamide (IMODIUM A-D) 2 MG tablet Take 2 mg by  mouth 4 (four) times daily as needed for diarrhea or loose stools.    Historical Provider, MD  magnesium oxide (MAG-OX) 400 MG tablet Take 400 mg by mouth daily.    Historical Provider, MD  omeprazole (PRILOSEC) 20 MG capsule Take 20 mg by mouth 2 (two) times daily before a meal.    Historical Provider, MD  oxybutynin (DITROPAN-XL) 5 MG 24 hr tablet Take 5 mg by mouth at bedtime.    Historical Provider, MD  polyethylene glycol (MIRALAX / GLYCOLAX) packet Take 17 g by mouth daily.    Historical Provider, MD  potassium chloride (K-DUR) 10 MEQ tablet Take 10 mEq by mouth daily.    Historical Provider, MD  thiamine 100 MG tablet Take 100 mg by mouth daily.    Historical Provider, MD      PHYSICAL EXAMINATION:   VITAL SIGNS: Blood pressure 142/90, pulse 91, temperature 98.6 F (37 C), temperature source Oral, resp. rate 18, height 5\' 4"  (1.626 m), weight 56.7 kg (125 lb), SpO2 99 %.  GENERAL:  80 y.o.-year-old patient lying in the bed with no acute distress.  EYES: Pupils equal, round, reactive to light and accommodation. No scleral icterus. Extraocular muscles intact.  HEENT: Head atraumatic, normocephalic. Oropharynx and nasopharynx clear.  NECK:  Supple, no jugular venous distention. No thyroid enlargement, no tenderness.  LUNGS: Normal breath sounds bilaterally, no wheezing, rales,rhonchi or crepitation. No use of accessory muscles of respiration.  CARDIOVASCULAR: S1, S2 normal. No murmurs, rubs, or gallops.  ABDOMEN: Soft, nontender, nondistended. Bowel sounds present. No organomegaly or mass.  EXTREMITIES: No pedal edema, cyanosis, or clubbing.  NEUROLOGIC: Cranial nerves II through XII are intact. Muscle strength 5/5 in all extremities. Sensation intact. Gait not checked.  PSYCHIATRIC: The patient is alert and oriented x 3.  SKIN: No obvious rash, lesion, or ulcer. Stage I decubitus ulcer present on admission  LABORATORY PANEL:   CBC  Recent Labs Lab 12/05/14 1607  WBC 7.3  HGB  7.9*  HCT 24.4*  PLT 223  MCV 93.7  MCH 30.3  MCHC 32.3  RDW 16.8*  LYMPHSABS 1.8  MONOABS 0.7  EOSABS 0.3  BASOSABS 0.1   ------------------------------------------------------------------------------------------------------------------  Chemistries   Recent Labs Lab 12/05/14 1607  NA 139  K 4.4  CL 103  CO2 24  GLUCOSE 127*  BUN 36*  CREATININE 2.78*  CALCIUM 8.3*  AST 41  ALT 16  ALKPHOS 67  BILITOT 0.6   ------------------------------------------------------------------------------------------------------------------ estimated creatinine clearance is 14.6 mL/min (by C-G formula based on Cr of 2.78). ------------------------------------------------------------------------------------------------------------------ No results for input(s): TSH, T4TOTAL, T3FREE, THYROIDAB in the last 72 hours.  Invalid input(s): FREET3   Coagulation profile No results for input(s): INR, PROTIME in the last 168 hours. ------------------------------------------------------------------------------------------------------------------- No results for input(s): DDIMER in the last 72 hours. -------------------------------------------------------------------------------------------------------------------  Cardiac Enzymes No results for input(s): CKMB, TROPONINI, MYOGLOBIN in the last 168 hours.  Invalid input(s):  CK ------------------------------------------------------------------------------------------------------------------ Invalid input(s): POCBNP  ---------------------------------------------------------------------------------------------------------------  Urinalysis    Component Value Date/Time   COLORURINE YELLOW* 11/13/2014 2002   APPEARANCEUR HAZY* 11/13/2014 2002   LABSPEC 1.008 11/13/2014 2002   PHURINE 6.0 11/13/2014 2002   GLUCOSEU NEGATIVE 11/13/2014 2002   HGBUR NEGATIVE 11/13/2014 2002   [place] NEGATIVE 11/13/2014 2002   [place] NEGATIVE 11/13/2014  2002   PROTEINUR NEGATIVE 11/13/2014 2002   NITRITE NEGATIVE 11/13/2014 2002   LEUKOCYTESUR 3+* 11/13/2014 2002     RADIOLOGY: No results found.  EKG: Orders placed or performed during the hospital encounter of 12/05/14  . ED EKG  . ED EKG    IMPRESSION AND PLAN:  Patient is a 80 year old African-American female with history of abuse in the past history of anemia presents with generalized weakness dark tarry stools.  1. Dark starry stools: Suspected upper GI bleed: Start patient on Protonix twice a day, patient's hemoglobin has dropped 2 g over the past few days, I will go ahead and transfuse her 1 unit of packed RBCs will obtain a GI consult, Patient consented to transfusion risk and benefits explained she is agreeable to transfusion.  2 generalized weakness: Likely due to anemia will obtain physical therapy evaluation  3. Gout continue allopurinol I'll add when necessary colchicine  4.  Hypothyroidism continue level thyroxine  5. History of heavy alcohol abuse in the past states that now only drinks 6 beers a weekly not on daily basis monitor for any withdrawal symptoms  6. Diarrhea check stool for C. difficile and stool for stool cultures  All the records are reviewed and case discussed with ED provider. Management plans discussed with the patient, family and they are in agreement.  CODE STATUS: Full    TOTAL TIME TAKING CARE OF THIS PATIENT: 55 minutes.    Dustin Flock M.D on 12/05/2014 at 6:53 PM  Between 7am to 6pm - Pager - [phone]  After 6pm go to [email or web address] - password [email or web address] [facility]  Office  [phone]  CC: Primary care physician; Marguerita Merles, MD

## 2014-12-05 NOTE — ED Provider Notes (Signed)
[facility] Emergency Department Provider Note  ____________________________________________  Time seen: Approximately 4:28 PM  I have reviewed the triage vital signs and the nursing notes.   HISTORY  Chief Complaint Diarrhea and Rash  The patient is a poor and vague historian  HPI [name] Holmes is a 80 y.o. female with several recent emergency department visits.  She presents today with pain in the sacral region.  She was recently seen and discharged with a stage I decubitus ulcer.  She has been in and out of skilled nursing facilities but refuses to stay.  Currently she lives at home and her niece, who is also her health care aide, comes in once a day to assist her.  She called EMS today because of the pain in her buttocks.  Upon arrival she had loose stool which was green but not particularly foul smelling and not concerning for C. difficile and the opinion of the emergency department nurses that helped clean her up.  She also complains of her gout in both of her feet which is a chronic medical condition.She denies chest pain, shortness of breath, abdominal pain, and dysuria although she said it does hurt her bottom when she urinates.  She states that she has been increasingly weak recently and all pulse "falls out" when she tries to get up and move around.   Past Medical History  Diagnosis Date  . Arthritis   . Hypertension   . Anemia   . Gout     Patient Active Problem List   Diagnosis Date Noted  . Acute on chronic kidney failure 11/03/2014  . UTI (lower urinary tract infection) 11/03/2014  . Anemia     Past Surgical History  Procedure Laterality Date  . Abdominal hysterectomy    . Esophagogastroduodenoscopy (egd) with propofol N/A 11/08/2014    Procedure: ESOPHAGOGASTRODUODENOSCOPY (EGD) WITH PROPOFOL;  Surgeon: Lollie Sails, MD;  Location: [facility] ENDOSCOPY;  Service: Endoscopy;  Laterality: N/A;    Current Outpatient Rx  Name  Route  Sig   Dispense  Refill  . acetaminophen (TYLENOL) 325 MG tablet   Oral   Take 650 mg by mouth every 6 (six) hours as needed.         Marland Kitchen allopurinol (ZYLOPRIM) 100 MG tablet   Oral   Take 100 mg by mouth daily.         . cholecalciferol (VITAMIN D) 1000 UNITS tablet   Oral   Take 1,000 Units by mouth daily.         Marland Kitchen docusate sodium (COLACE) 100 MG capsule   Oral   Take 100 mg by mouth daily as needed for mild constipation (at night).         . febuxostat (ULORIC) 40 MG tablet   Oral   Take 40 mg by mouth daily.         . ferrous sulfate (FERROUSUL) 325 (65 FE) MG tablet   Oral   Take 1 tablet (325 mg total) by mouth 2 (two) times daily with a meal.   60 tablet   0   . ferrous sulfate 325 (65 FE) MG tablet   Oral   Take 325 mg by mouth daily with breakfast.         . folic acid (FOLVITE) 1 MG tablet   Oral   Take 1 mg by mouth daily.         . furosemide (LASIX) 40 MG tablet   Oral   Take 40  mg by mouth daily.         Marland Kitchen levothyroxine (SYNTHROID, LEVOTHROID) 50 MCG tablet   Oral   Take 50 mcg by mouth daily before breakfast.         . loperamide (IMODIUM A-D) 2 MG tablet   Oral   Take 2 mg by mouth 4 (four) times daily as needed for diarrhea or loose stools.         . magnesium oxide (MAG-OX) 400 MG tablet   Oral   Take 400 mg by mouth daily.         Marland Kitchen omeprazole (PRILOSEC) 20 MG capsule   Oral   Take 20 mg by mouth 2 (two) times daily before a meal.         . oxybutynin (DITROPAN-XL) 5 MG 24 hr tablet   Oral   Take 5 mg by mouth at bedtime.         . polyethylene glycol (MIRALAX / GLYCOLAX) packet   Oral   Take 17 g by mouth daily.         . potassium chloride (K-DUR) 10 MEQ tablet   Oral   Take 10 mEq by mouth daily.         Marland Kitchen thiamine 100 MG tablet   Oral   Take 100 mg by mouth daily.           Allergies Review of patient's allergies indicates no known allergies.  Family History  Problem Relation Age of Onset  .  Hypertension Father     Social History History  Substance Use Topics  . Smoking status: Former Research scientist (life sciences)  . Smokeless tobacco: Not on file  . Alcohol Use: 10.8 oz/week    12 Cans of beer, 6 Shots of liquor per week    Review of Systems Constitutional: No fever/chills Eyes: No visual changes. ENT: No sore throat. Cardiovascular: Denies chest pain. Respiratory: Denies shortness of breath. Gastrointestinal: No abdominal pain.  No nausea, no vomiting.  Occasional loose stools.  No constipation. Genitourinary: Negative for dysuria. Musculoskeletal: Negative for back pain. Skin: Negative for rash. Pain in the sacrum Neurological: Negative for headaches, focal weakness or numbness.  10-point ROS otherwise negative.  ____________________________________________   PHYSICAL EXAM:  VITAL SIGNS: ED Triage Vitals  Enc Vitals Group     BP 12/05/14 1544 118/86 mmHg     Pulse Rate 12/05/14 1544 84     Resp 12/05/14 1544 18     Temp 12/05/14 1544 98.6 F (37 C)     Temp Source 12/05/14 1544 Oral     SpO2 12/05/14 1544 98 %     Weight 12/05/14 1544 125 lb (56.7 kg)     Height 12/05/14 1544 5\' 4"  (1.626 m)     Head Cir --      Peak Flow --      Pain Score 12/05/14 1546 10     Pain Loc --      Pain Edu? --      Excl. in [place]? --     Constitutional: Alert and oriented. Well appearing and in no acute distress. Eyes: Conjunctivae are normal. PERRL. EOMI.  Muddy sclera Head: Atraumatic. Nose: No congestion/rhinnorhea. Mouth/Throat: Mucous membranes are moist.  Oropharynx non-erythematous.  Poor dentition Neck: No stridor.   Cardiovascular: Normal rate, regular rhythm. Grossly normal heart sounds.  Good peripheral circulation. Respiratory: Normal respiratory effort.  No retractions. Lungs CTAB. Gastrointestinal: Obese, Soft and nontender. No distention. No abdominal bruits. No CVA tenderness. Genitourinary/Rectal:  Nontender rectal exam, light brown stool without gross blood but  Hemoccult positive with quality control passed.   Musculoskeletal: Trace to 1+ pitting edema of bilateral lower extremities.  No podagra.  No joint effusions.  No tenderness when I palpate her feet. Neurologic:  Normal speech and language. No gross focal neurologic deficits are appreciated. Speech is normal. No gait instability. Skin:  Stage I sacral decubitus ulcer with excoriation but no sign of cellulitis or deep wound at this time.  Otherwise Skin is warm, dry and intact.  Psychiatric: Mood and affect are normal. Speech and behavior are normal.  ____________________________________________   LABS (all labs ordered are listed, but only abnormal results are displayed)  Labs Reviewed  CBC WITH DIFFERENTIAL/PLATELET - Abnormal; Notable for the following:    RBC 2.60 (*)    Hemoglobin 7.9 (*)    HCT 24.4 (*)    RDW 16.8 (*)    All other components within normal limits  COMPREHENSIVE METABOLIC PANEL - Abnormal; Notable for the following:    Glucose, Bld 127 (*)    BUN 36 (*)    Creatinine, Ser 2.78 (*)    Calcium 8.3 (*)    Total Protein 6.1 (*)    Albumin 2.5 (*)    GFR calc non Af Amer 15 (*)    GFR calc Af Amer 18 (*)    All other components within normal limits  C DIFFICILE QUICK SCAN W PCR REFLEX ([facility] ONLY)  TROPONIN I  TYPE AND SCREEN   ____________________________________________  EKG  ED ECG REPORT I, Haddon Fyfe, the attending physician, personally viewed and interpreted this ECG.   Date: 12/05/2014  EKG Time: 18:19  Rate: 90  Rhythm: normal sinus rhythm, LBBB  Axis: Left axis deviation  Intervals:none  ST&T Change: None LBBB was present on prior EKG ____________________________________________  RADIOLOGY  No results found.  ____________________________________________   INITIAL IMPRESSION / ASSESSMENT AND PLAN / ED COURSE  Pertinent labs & imaging results that were available during my care of the patient were reviewed by me and considered in my  medical decision making (see chart for details).  The patient had this generalized weakness with an acute on chronic anemia.  Her hemoglobin is currently 7.9 which is down more than 2 points since her last lab work approximately 2-3 weeks ago.  In the setting of her positive Hemoccult and her symptomatic anemia I believe she should be admitted for further workup.  A slow GI bleed would explain her acute on chronic anemia.  I am giving her a pantoprazole bolus and drip.  Is important to note that she has an existing stage I sacral decubitus ulcer upon admission.  She has no abdominal pain and the stool reportedly was not consistent with C. difficile, but we will check a C. difficile test if she is able to produce a bowel movement.  She has not had the urge to do so since arriving to the emergency department.  She would likely benefit from skilled nursing placement after her emergency department stay, but she consistently leaves the facility so it is unlikely that placement will be successful.  ____________________________________________  FINAL CLINICAL IMPRESSION(S) / ED DIAGNOSES  Final diagnoses:  Gastrointestinal hemorrhage, unspecified gastritis, unspecified gastrointestinal hemorrhage type  Decubitus ulcer of sacral region, stage 1  Acute blood loss anemia      NEW MEDICATIONS STARTED DURING THIS VISIT:  New Prescriptions   No medications on file     Hinda Kehr, MD 12/05/14  2202 

## 2014-12-06 DIAGNOSIS — L8990 Pressure ulcer of unspecified site, unspecified stage: Secondary | ICD-10-CM | POA: Insufficient documentation

## 2014-12-06 LAB — BASIC METABOLIC PANEL
Anion gap: 7 (ref 5–15)
BUN: 34 mg/dL — ABNORMAL HIGH (ref 6–20)
CALCIUM: 7.7 mg/dL — AB (ref 8.9–10.3)
CO2: 25 mmol/L (ref 22–32)
CREATININE: 2.63 mg/dL — AB (ref 0.44–1.00)
Chloride: 106 mmol/L (ref 101–111)
GFR calc Af Amer: 19 mL/min — ABNORMAL LOW (ref >60)
GFR, EST NON AFRICAN AMERICAN: 16 mL/min — AB (ref >60)
Glucose, Bld: 106 mg/dL — ABNORMAL HIGH (ref 65–99)
Potassium: 3.9 mmol/L (ref 3.5–5.1)
SODIUM: 138 mmol/L (ref 135–145)

## 2014-12-06 LAB — CBC
HCT: 25.3 % — ABNORMAL LOW (ref 35.0–47.0)
Hemoglobin: 8.4 g/dL — ABNORMAL LOW (ref 12.0–16.0)
MCH: 30.1 pg (ref 26.0–34.0)
MCHC: 33.2 g/dL (ref 32.0–36.0)
MCV: 90.7 fL (ref 80.0–100.0)
PLATELETS: 195 10*3/uL (ref 150–440)
RBC: 2.79 MIL/uL — AB (ref 3.80–5.20)
RDW: 18.8 % — ABNORMAL HIGH (ref 11.5–14.5)
WBC: 7.2 10*3/uL (ref 3.6–11.0)

## 2014-12-06 LAB — C DIFFICILE QUICK SCREEN W PCR REFLEX
C DIFFICLE (CDIFF) ANTIGEN: POSITIVE
C Diff toxin: NEGATIVE

## 2014-12-06 LAB — HEMOGLOBIN AND HEMATOCRIT, BLOOD
HCT: 29.2 % — ABNORMAL LOW (ref 35.0–47.0)
HCT: 26.4 % — ABNORMAL LOW (ref 35.0–47.0)
Hemoglobin: 9.4 g/dL — ABNORMAL LOW (ref 12.0–16.0)
Hemoglobin: 8.7 g/dL — ABNORMAL LOW (ref 12.0–16.0)

## 2014-12-06 LAB — CLOSTRIDIUM DIFFICILE BY PCR: CDIFFPCR: NEGATIVE

## 2014-12-06 NOTE — Consult Note (Signed)
GI Inpatient Consult Note  Reason for Consult: melena, anemia   Attending Requesting Consult: Gouru  History of Present Illness:  [name] Holmes is a 80 y.o. female with past medical history notable for anemia, EtOH use, hypertension who is presenting to the hospital for evaluation of melena and anemia. Mccurley reports that she is been having black stools for approximately 1 month. Happens about once or twice per day. He does endorse it as being black and tarry. It does cause associated anal itching and burning. Of note she was hospitalized for similar symptoms in early May and did undergo an upper endoscopy with no source at that time. However per her the black stools have continued. She continues to be anemic currently with a hemoglobin of 9. Bleeding episodes are not associated with any abdominal pain.  She is unsure of the date of her last colonoscopy. She thinks it may have been 5-10 years ago at [facility] but is quite unsure. No family history of colon cancer. She denies use of NSAIDs. Currently she feels well. He is not having any chest pain shortness of breath and dizziness lightheaded.  Past Medical History:  Past Medical History  Diagnosis Date  . Arthritis   . Hypertension   . Anemia   . Gout   . ETOH abuse     Problem List: Patient Active Problem List   Diagnosis Date Noted  . Pressure ulcer 12/06/2014  . Weakness 12/05/2014  . H/O ETOH abuse 12/05/2014  . HTN (hypertension) 12/05/2014  . Gout 12/05/2014  . Acute on chronic kidney failure 11/03/2014  . UTI (lower urinary tract infection) 11/03/2014  . Anemia     Past Surgical History: Past Surgical History  Procedure Laterality Date  . Abdominal hysterectomy    . Esophagogastroduodenoscopy (egd) with propofol N/A 11/08/2014    Procedure: ESOPHAGOGASTRODUODENOSCOPY (EGD) WITH PROPOFOL;  Surgeon: Lollie Sails, MD;  Location: [facility] ENDOSCOPY;  Service: Endoscopy;  Laterality: N/A;    Allergies: No Known Allergies   Home Medications: Prescriptions prior to admission  Medication Sig Dispense Refill Last Dose  . acetaminophen (TYLENOL) 325 MG tablet Take 650 mg by mouth every 6 (six) hours as needed for mild pain or headache.    PRN at PRN  . docusate sodium (COLACE) 100 MG capsule Take 100 mg by mouth at bedtime as needed for mild constipation.    PRN at PRN  . febuxostat (ULORIC) 40 MG tablet Take 40 mg by mouth daily.   12/05/2014 at am  . ferrous sulfate (FERROUSUL) 325 (65 FE) MG tablet Take 1 tablet (325 mg total) by mouth 2 (two) times daily with a meal. (Patient taking differently: Take 325 mg by mouth every morning. ) 60 tablet 0 0/0/9381 at am  . folic acid (FOLVITE) 1 MG tablet Take 1 mg by mouth daily.   12/05/2014 at am  . furosemide (LASIX) 40 MG tablet Take 40 mg by mouth every morning.    12/05/2014 at am  . levothyroxine (SYNTHROID, LEVOTHROID) 50 MCG tablet Take 50 mcg by mouth every morning.    12/05/2014 at am  . loperamide (IMODIUM A-D) 2 MG tablet Take 2 mg by mouth 4 (four) times daily as needed for diarrhea or loose stools.   PRN at PRN  . magnesium oxide (MAG-OX) 400 MG tablet Take 400 mg by mouth daily.   unknown at unknown  . omeprazole (PRILOSEC) 20 MG capsule Take 20 mg by mouth 2 (two) times daily.    12/05/2014  at am  . oxybutynin (DITROPAN-XL) 5 MG 24 hr tablet Take 5 mg by mouth at bedtime.   unknown at unknown  . polyethylene glycol (MIRALAX / GLYCOLAX) packet Take 17 g by mouth daily as needed for moderate constipation.    PRN at PRN  . potassium chloride (K-DUR,KLOR-CON) 10 MEQ tablet Take 10 mEq by mouth daily.   unknown at unknown  . thiamine 100 MG tablet Take 100 mg by mouth daily.   unknown at unknown   Home medication reconciliation was completed with the patient.   Scheduled Inpatient Medications:   . allopurinol  100 mg Oral Daily  . cholecalciferol  1,000 Units Oral Daily  . febuxostat  40 mg Oral Daily  . folic acid  1 mg Oral Daily  . furosemide  40 mg Oral q  morning - 10a  . levothyroxine  50 mcg Oral q morning - 10a  . magnesium oxide  400 mg Oral Daily  . oxybutynin  5 mg Oral QHS  . polyethylene glycol  17 g Oral Daily  . potassium chloride  10 mEq Oral Daily  . sodium chloride  3 mL Intravenous Q12H  . thiamine  100 mg Oral Daily    Continuous Inpatient Infusions:   . pantoprozole (PROTONIX) infusion 8 mg/hr (12/06/14 1247)    PRN Inpatient Medications:  sodium chloride, acetaminophen, alum & mag hydroxide-simeth, docusate sodium, HYDROcodone-acetaminophen, ondansetron **OR** ondansetron (ZOFRAN) IV, sodium chloride  Family History: family history includes Hypertension in her father.  The patient's family history is negative for inflammatory bowel disorders, GI malignancy, or solid organ transplantation.  Social History:   reports that she has quit smoking. She does not have any smokeless tobacco history on file. She reports that she drinks about 7.2 oz of alcohol per week. She reports that she does not use illicit drugs.    Review of Systems: Constitutional: Weight is stable.  Eyes: No changes in vision. ENT: No oral lesions, sore throat.  GI: see HPI.  Heme/Lymph: No easy bruising.  CV: No chest pain.  GU: No hematuria.  Integumentary: No rashes.  Neuro: No headaches.  Psych: No depression/anxiety.  Endocrine: No heat/cold intolerance.  Allergic/Immunologic: No urticaria.  Resp: No cough, SOB.  Musculoskeletal: No joint swelling.    Physical Examination: BP 117/65 mmHg  Pulse 92  Temp(Src) 98.2 F (36.8 C) (Oral)  Resp 18  Ht 5\' 4"  (1.626 m)  Wt 125 lb (56.7 kg)  BMI 21.45 kg/m2  SpO2 100% Gen: NAD, alert and oriented x 4 HEENT: PEERLA, EOMI, Neck: supple, no JVD or thyromegaly Chest: CTA bilaterally, no wheezes, crackles, or other adventitious sounds CV: RRR, no m/g/c/r Abd: soft, NT, ND, +BS in all four quadrants; no HSM, guarding, ridigity, or rebound tenderness Ext: no edema, well perfused with 2+  pulses, Skin: no rash or lesions noted Lymph: no LAD  Data: Lab Results  Component Value Date   WBC 7.2 12/06/2014   HGB 9.4* 12/06/2014   HCT 29.2* 12/06/2014   MCV 90.7 12/06/2014   PLT 195 12/06/2014    Recent Labs Lab 12/05/14 1607 12/06/14 0440 12/06/14 1653  HGB 7.9* 8.4* 9.4*   Lab Results  Component Value Date   NA 138 12/06/2014   K 3.9 12/06/2014   CL 106 12/06/2014   CO2 25 12/06/2014   BUN 34* 12/06/2014   CREATININE 2.63* 12/06/2014   Lab Results  Component Value Date   ALT 16 12/05/2014   AST 41 12/05/2014  ALKPHOS 67 12/05/2014   BILITOT 0.6 12/05/2014   No results for input(s): APTT, INR, PTT in the last 168 hours.   Assessment/Plan: Ms. Lymon is a 80 y.o. female with past medical history notable for anemia, EtOH, hypertension who is presenting for evaluation of melena and anemia. Her hemoglobin has trended down to 8.4 from 10 when she left the hospital in early May. He continues to report 1 to 2 black tarry bowel movements per day. He did have an upper endoscopy 11/08/2014 with no source. Unsure of her last colonoscopy but it sounds like it's been many years ago. I would recommend repeating a colonoscopy to further evaluate for a source of her melena and anemia. She is in agreement with this procedure.  Recommendations: #1: Continue to monitor hemodynamics and hemoglobin until stable #2: Clear liquid diet  #3: We'll plan for colonoscopy on Thursday morning. #4: Colonoscopy prep on Wednesday evening. 5: Call GI with any changes in patient's clinical status We'll follow thank you for this consult   Thank you for the consult. Please call with questions or concerns.  Ameilia Rattan, Grace Blight, MD
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
## 2014-12-06 NOTE — Care Management (Addendum)
Admitted to [facility] with the diagnosis of weakness. Lives alone. Sister is [employer] [phone]).  Sees Dr. Delight Stare. States she has an appointment for next Monday. States she uses a cane and rolling walker to aid in ambulation. No home oxygen. States she receives nursing physical therapy, and aide services from [facility]. States the nursing assistant comes every day for about 2 hours. States she was at [facility] a short while ago  . States she hasn't fallen in a while, but has trouble with balance. Physical therapy evaluation pending. Shelbie Ammons RN MSN Care Management [phone]

## 2014-12-06 NOTE — Plan of Care (Signed)
Problem: Discharge Progression Outcomes Goal: Activity appropriate for discharge plan Outcome: Progressing Plan of care progress to goal:  No complaints of pain. Enteric precautions discontinued, C diff. PCR negative. Patient continues to have loose dark brown stools, no blood noted. Hemoglobin 9.4. Protonix drip infusing as ordered. Need urine sample.

## 2014-12-06 NOTE — Plan of Care (Signed)
Problem: Discharge Progression Outcomes Goal: Discharge plan in place and appropriate Individualization: 1. Lives in apartment near her dgt; reports neighbors close by. 2. Reports 3 or so falls a home in last 6 months- HIGH risk for fall. 3. Reports has home health aide who assists her with personal care/runs errands/uses pill box to take meds. 4.  Reports wears depends due to urinary/stool incontinence.  Medical history includes hypothyroidism, alcohol abuse,gout which is controlled by home meds.   Goal: Home Health Care arrangements in place Outcome: Progressing Plan of care progress to goals: VSS. 1xunit of RBC's transfused, no transfusion reaction noted. Denies pain. No c/o n/v. Incontinent for urine. Enteric precautions to r/o C diff initiated upon admission. No stool during the night. No signs of bleeding. CBC results pending.

## 2014-12-06 NOTE — Evaluation (Signed)
Physical Therapy Evaluation Patient Details Name: [name] Holmes MRN: [id number] DOB: [birth year] Today's Date: 12/06/2014   History of Present Illness  Pt is a 80 y.o. female with recent admission 5/5 for UTI and recent ER visit d/t progressive weakness and unable to care for self at home since discharge.  Pt now admitted with generalized weakness, diarrhea/dark tarry stools.   Clinical Impression  Currently pt demonstrates impairments with strength and limitations with functional mobility.  Prior to admission, pt was independent ambulating with walker.  Pt lives alone in 1 level home and has outside agency assist.  Currently pt is CGA to min assist with functional mobility with RW; unable to assess further mobility d/t pt soaked in urine upon arrival (pt unaware of this until PT took off pt's sheets and gown/bed/sheets soaked) and pt also incontinent of bowel requiring clean-up.  Pt would benefit from skilled PT to address above noted impairments and functional limitations.  Recommend pt discharge to STR when medically appropriate.     Follow Up Recommendations SNF    Equipment Recommendations       Recommendations for Other Services       Precautions / Restrictions Precautions Precautions: Fall Restrictions Weight Bearing Restrictions: No      Mobility  Bed Mobility Overal bed mobility: Needs Assistance Bed Mobility: Supine to Sit     Supine to sit: Supervision        Transfers Overall transfer level: Needs assistance Equipment used: Rolling walker (2 wheeled) Transfers: Sit to/from Stand Sit to Stand: Min guard;Min assist         General transfer comment: vc's for safety  Ambulation/Gait Ambulation/Gait assistance: Min guard;Min assist Ambulation Distance (Feet): 5 Feet (to commode) Assistive device: Rolling walker (2 wheeled)       General Gait Details: decreased cadence; decreased B step length/foot clearance/heelstrike  Stairs             Wheelchair Mobility    Modified Rankin (Stroke Patients Only)       Balance                                             Pertinent Vitals/Pain Pain Assessment: No/denies pain  Vitals stable and WFL throughout treatment session.     Home Living Family/patient expects to be discharged to:: Private residence Living Arrangements: Alone (lives close to her daughter) Available Help at Discharge: Personal care attendant;Family (Nursing aide 2 hours each day for meal prep and household activities) Type of Home: Apartment Home Access: Level entry     Home Layout: One level Home Equipment: Walker - 2 wheels;Cane - quad;Grab bars - toilet;Shower seat      Prior Function Level of Independence: Needs assistance   Gait / Transfers Assistance Needed: RW for household mobilization  ADL's / Homemaking Assistance Needed: caregiver by CNA every day and RN 1-2 times a week for meds        Hand Dominance        Extremity/Trunk Assessment   Upper Extremity Assessment: Generalized weakness           Lower Extremity Assessment: Generalized weakness         Communication   Communication: Expressive difficulties  Cognition Arousal/Alertness: Awake/alert Behavior During Therapy: WFL for tasks assessed/performed Overall Cognitive Status: Within Functional Limits for tasks assessed  General Comments  Nursing cleared pt for participation in physical therapy.  Pt agreeable to PT session.    Exercises        Assessment/Plan    PT Assessment Patient needs continued PT services  PT Diagnosis Difficulty walking;Generalized weakness   PT Problem List Decreased strength;Decreased range of motion;Decreased activity tolerance;Decreased balance;Decreased mobility;Decreased safety awareness;Decreased knowledge of precautions  PT Treatment Interventions DME instruction;Gait training;Functional mobility training;Therapeutic  activities;Therapeutic exercise;Balance training;Neuromuscular re-education;Patient/family education   PT Goals (Current goals can be found in the Care Plan section) Acute Rehab PT Goals Patient Stated Goal: to get better PT Goal Formulation: With patient Time For Goal Achievement: 12/20/14 Potential to Achieve Goals: Fair    Frequency Min 2X/week   Barriers to discharge Decreased caregiver support      Co-evaluation               End of Session   Activity Tolerance: Patient limited by fatigue Patient left:  (on commode with nursing present for clean-up) Nurse Communication: Mobility status         Time: 7680-8811 PT Time Calculation (min) (ACUTE ONLY): 25 min   Charges:   PT Evaluation $Initial PT Evaluation Tier I: 1 Procedure     PT G CodesLeitha Bleak 18-Dec-2014, 5:44 PM Leitha Bleak, [place]

## 2014-12-06 NOTE — Progress Notes (Signed)
Notified Dr Marcille Blanco of duplicate order for type and screen and antibody screen. MD verbalized to d/c the orders.

## 2014-12-07 ENCOUNTER — Inpatient Hospital Stay: Payer: Medicare Other

## 2014-12-07 LAB — TYPE AND SCREEN
ABO/RH(D): O POS
Antibody Screen: NEGATIVE
Unit division: 0

## 2014-12-07 LAB — BASIC METABOLIC PANEL
Anion gap: 5 (ref 5–15)
BUN: 32 mg/dL — AB (ref 6–20)
CALCIUM: 7.7 mg/dL — AB (ref 8.9–10.3)
CO2: 25 mmol/L (ref 22–32)
Chloride: 108 mmol/L (ref 101–111)
Creatinine, Ser: 2.39 mg/dL — ABNORMAL HIGH (ref 0.44–1.00)
GFR, EST AFRICAN AMERICAN: 21 mL/min — AB (ref >60)
GFR, EST NON AFRICAN AMERICAN: 18 mL/min — AB (ref >60)
GLUCOSE: 75 mg/dL (ref 65–99)
POTASSIUM: 4.4 mmol/L (ref 3.5–5.1)
Sodium: 138 mmol/L (ref 135–145)

## 2014-12-07 LAB — CBC
HEMATOCRIT: 25.3 % — AB (ref 35.0–47.0)
Hemoglobin: 8.2 g/dL — ABNORMAL LOW (ref 12.0–16.0)
MCH: 29.4 pg (ref 26.0–34.0)
MCHC: 32.3 g/dL (ref 32.0–36.0)
MCV: 90.8 fL (ref 80.0–100.0)
Platelets: 182 10*3/uL (ref 150–440)
RBC: 2.78 MIL/uL — ABNORMAL LOW (ref 3.80–5.20)
RDW: 18.4 % — ABNORMAL HIGH (ref 11.5–14.5)
WBC: 7.1 10*3/uL (ref 3.6–11.0)

## 2014-12-07 LAB — HEMOGLOBIN AND HEMATOCRIT, BLOOD
HCT: 25.4 % — ABNORMAL LOW (ref 35.0–47.0)
Hemoglobin: 8.1 g/dL — ABNORMAL LOW (ref 12.0–16.0)

## 2014-12-07 MED ORDER — POLYETHYLENE GLYCOL 3350 17 GM/SCOOP PO POWD
1.0000 | Freq: Once | ORAL | Status: DC
  Filled 2014-12-07: qty 255

## 2014-12-07 MED ORDER — SODIUM CHLORIDE 0.9 % IV SOLN
INTRAVENOUS | Status: DC
  Administered 2014-12-07 – 2014-12-08 (×2): via INTRAVENOUS

## 2014-12-07 MED ORDER — POLYETHYLENE GLYCOL 3350 17 GM/SCOOP PO POWD
1.0000 | Freq: Once | ORAL | Status: AC
  Administered 2014-12-07: 22:00:00 255 g via ORAL
  Filled 2014-12-07: qty 255

## 2014-12-07 MED ORDER — DIPHENHYDRAMINE HCL 25 MG PO CAPS
25.0000 mg | ORAL_CAPSULE | Freq: Four times a day (QID) | ORAL | Status: DC | PRN
Start: 2014-12-07 — End: 2014-12-08
  Administered 2014-12-07 – 2014-12-08 (×2): 25 mg via ORAL
  Filled 2014-12-07 (×2): qty 1

## 2014-12-07 NOTE — Plan of Care (Addendum)
Problem: Discharge Progression Outcomes Goal: [place] arrangements in place Outcome: Not Progressing Plan of care progress to goal: VSS. Hgb trending down, currently 8.1, CBC pending. Protonix drip infusing. 1xsoft dark brown stool. Denies pain/nausea. Urine collected. Per GI consult note- colonoscopy on Thursday.

## 2014-12-07 NOTE — Clinical Social Work Note (Signed)
Clinical Social Work Assessment  Patient Details  Name: [name] Holmes MRN: [id number] Date of Birth: 1934/10/26  Date of referral:  12/07/14               Reason for consult:  Facility Placement                Permission sought to share information with:  Family Supports Permission granted to share information::  No   Housing/Transportation Living arrangements for the past 2 months:  Single Family Home Source of Information:  Patient Patient Interpreter Needed:  None Criminal Activity/Legal Involvement Pertinent to Current Situation/Hospitalization:  No - Comment as needed Significant Relationships:  Adult Children, Other Family Members, Siblings Lives with:    Do you feel safe going back to the place where you live?    Need for family participation in patient care:  No (Coment)  Care giving concerns:  Pt did not report care giver concerns.   Social Worker assessment / plan:  CSW met with pt address consult. CSW introduced herself and explained role of social work. CSW also explained process of discharging to SNF, as per PT's recommendations. Pt lives alone and has a private duty agency that comes in the AM and PM daily. Pt is also followed by [facility]. Pt's daughter lives behind her. Pt shared that she has been to SNF in the past. Pt declined SNF placement. Pt stated that she would like to return home at discharge. Pt feels that her level of care at home is adequate.   CSW updated RNCM of pt's choice. CSW will sign off at this time. Please reconsult if a need arises prior to discharge.   Employment status:  Retired Programmer, applications PT Recommendations:  [place] / Referral to community resources:  Other (Comment Required) (Pt is established with [facility] and a private duty agency)  Patient/Family's Response to care:  Pt was appreciate of CSW support and understanding that she would like to return home.    Patient/Family's Understanding of and Emotional Response to Diagnosis, Current Treatment, and Prognosis:  Pt would like to return home and does feel that she needs SNF level of care.   Emotional Assessment Appearance:  Appears older than stated age Attitude/Demeanor/Rapport:  Other (appropriate) Affect (typically observed):  Accepting Orientation:  Oriented to Self, Oriented to Place, Oriented to  Time, Oriented to Situation Alcohol / Substance use:  Never Used Psych involvement (Current and /or in the community):  No (Comment)  Discharge Needs  Concerns to be addressed:  Denies Needs/Concerns at this time, Patient refuses services Readmission within the last 30 days:  No Current discharge risk:  Lives alone Barriers to Discharge:  Barriers Resolved   Darden Dates, LCSW 12/07/2014, 1:55 PM

## 2014-12-07 NOTE — Clinical Documentation Improvement (Signed)
MD's, NP's, and PA's   Noted BUN (34, 36) Creatinine (2.63, 2.78), and GFR (19, 18) please document clinical diagnosis if appropriate.  Thank you   Possible Clinical Condition(?   CKD Stage I - GFR > OR = 90 CKD Stage II - GFR 60-80 CKD Stage III - GFR 30-59 CKD Stage IV - GFR 15-29 CKD Stage V - GFR < 15 Acute on Chronic Renal Failure Other condition Cannot Clinically determine    Risk Factors: ? Gi Bleed, Anemia, HTN  Diagnostics: BMET x2  Treatment: Monitoring   Thank You, Ree Kida ,RN Clinical Documentation Specialist:  [place] Information Management
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
## 2014-12-07 NOTE — Plan of Care (Signed)
Problem: Discharge Progression Outcomes Goal: Discharge plan in place and appropriate Outcome: Progressing Individualization: Individualization: 1. Lives in apartment near her dgt; reports neighbors close by. 2. Reports 3 or so falls a home in last 6 months- HIGH risk for fall. 3. Reports has home health aide who assists her with personal care/runs errands/uses pill box to take meds. 4.  Reports wears depends due to urinary/stool incontinence.

## 2014-12-07 NOTE — Progress Notes (Signed)
PT Cancellation Note  Patient Details Name: JADORE MCGUFFIN MRN: [id number] DOB: [birth year]   Cancelled Treatment:    Reason Eval/Treat Not Completed: Fatigue/lethargy limiting ability to participate (Pt reporting being too fatigued to participate and needing to finish lunch and rest.)   Leitha Bleak 12/07/2014, 12:45 PM Leitha Bleak, [place]

## 2014-12-07 NOTE — Progress Notes (Signed)
GI Note:  Off the floor for study. Hgb stable.  No more dark stool per nursing.    Recs: - colonoscopy tomorrow - miralax 255 grams tonight and 255 grams in am - must drink am prep from 5 am to 7 am.  NPO after 7 am.

## 2014-12-07 NOTE — Plan of Care (Signed)
Problem: Discharge Progression Outcomes Goal: Home Health Care arrangements in place Outcome: Progressing Plan of Care Progress to Goal: Pt hemodynamically stable - Hgb 8.2.  Will have colonoscopy tomorrow.  Will receive miralax tonight and another from 5-7 am tomorrow and then will be NPO.  Pt is incontinent of B&B.  C/o itching - order for benadryl in place - brought relief.  No c/o pain.  Refused to work w/PT who is recommending SNF but Pt wants to be d/ced home.  Currently has home health aid.  Abd U/S done. Protonix drip continued. Nephrology consult called and renal U/S done.

## 2014-12-08 ENCOUNTER — Encounter
Admission: EM | Disposition: A | Discharge status: Home / Self Care | Payer: Self-pay | Source: Home / Self Care | Attending: Internal Medicine

## 2014-12-08 ENCOUNTER — Inpatient Hospital Stay: Payer: Medicare Other | Admitting: Anesthesiology

## 2014-12-08 HISTORY — PX: COLONOSCOPY WITH PROPOFOL: SHX5780

## 2014-12-08 LAB — CBC
HCT: 25.7 % — ABNORMAL LOW (ref 35.0–47.0)
HEMOGLOBIN: 8.3 g/dL — AB (ref 12.0–16.0)
MCH: 29.6 pg (ref 26.0–34.0)
MCHC: 32.3 g/dL (ref 32.0–36.0)
MCV: 91.6 fL (ref 80.0–100.0)
Platelets: 175 10*3/uL (ref 150–440)
RBC: 2.8 MIL/uL — AB (ref 3.80–5.20)
RDW: 17.9 % — ABNORMAL HIGH (ref 11.5–14.5)
WBC: 6.6 10*3/uL (ref 3.6–11.0)

## 2014-12-08 LAB — BASIC METABOLIC PANEL
ANION GAP: 4 — AB (ref 5–15)
BUN: 29 mg/dL — AB (ref 6–20)
CO2: 27 mmol/L (ref 22–32)
Calcium: 7.6 mg/dL — ABNORMAL LOW (ref 8.9–10.3)
Chloride: 107 mmol/L (ref 101–111)
Creatinine, Ser: 2.31 mg/dL — ABNORMAL HIGH (ref 0.44–1.00)
GFR calc Af Amer: 22 mL/min — ABNORMAL LOW (ref >60)
GFR calc non Af Amer: 19 mL/min — ABNORMAL LOW (ref >60)
GLUCOSE: 80 mg/dL (ref 65–99)
POTASSIUM: 4.2 mmol/L (ref 3.5–5.1)
SODIUM: 138 mmol/L (ref 135–145)

## 2014-12-08 SURGERY — COLONOSCOPY WITH PROPOFOL
Anesthesia: General

## 2014-12-08 MED ORDER — EPHEDRINE SULFATE 50 MG/ML IJ SOLN
INTRAMUSCULAR | Status: DC | PRN
  Administered 2014-12-08: 10 mg via INTRAVENOUS

## 2014-12-08 MED ORDER — PROPOFOL INFUSION 10 MG/ML OPTIME
INTRAVENOUS | Status: DC | PRN
  Administered 2014-12-08: 140 ug/kg/min via INTRAVENOUS

## 2014-12-08 MED ORDER — LIDOCAINE HCL (CARDIAC) 20 MG/ML IV SOLN
INTRAVENOUS | Status: DC | PRN
  Administered 2014-12-08: 30 mg via INTRAVENOUS

## 2014-12-08 MED ORDER — PROPOFOL 10 MG/ML IV BOLUS
INTRAVENOUS | Status: DC | PRN
  Administered 2014-12-08: 50 mg via INTRAVENOUS

## 2014-12-08 MED ORDER — FENTANYL CITRATE (PF) 100 MCG/2ML IJ SOLN
INTRAMUSCULAR | Status: DC | PRN
  Administered 2014-12-08: 50 ug via INTRAVENOUS

## 2014-12-08 MED ORDER — FERROUS SULFATE 325 (65 FE) MG PO TABS: 325.0000 mg | ORAL_TABLET | Freq: Two times a day (BID) | ORAL | Status: DC

## 2014-12-08 MED ORDER — MIDAZOLAM HCL 5 MG/5ML IJ SOLN
INTRAMUSCULAR | Status: DC | PRN
  Administered 2014-12-08 (×2): 0.5 mg via INTRAVENOUS

## 2014-12-08 NOTE — Op Note (Signed)
[facility] Gastroenterology Patient Name: [name] Holmes Procedure Date: 12/08/2014 11:04 AM MRN: [id number] Account #: [id number] Date of Birth: 1935/06/16 Admit Type: Inpatient Age: 80 Room: [facility] ENDO ROOM 3 Gender: Female Note Status: Finalized Procedure:         Colonoscopy Indications:       Melena, Iron deficiency anemia Patient Profile:   This is a 80 year old female. Providers:         Gerrit Heck. Rayann Heman, MD Referring MD:      Marguerita Merles, MD (Referring MD) Medicines:         Propofol per Anesthesia Complications:     No immediate complications. Procedure:         Pre-Anesthesia Assessment:                    - Prior to the procedure, a History and Physical was                     performed, and patient medications and allergies were                     reviewed. The patient is competent. The risks and benefits                     of the procedure and the sedation options and risks were                     discussed with the patient. All questions were answered                     and informed consent was obtained. Patient identification                     and proposed procedure were verified by the physician and                     the nurse in the pre-procedure area. Mental Status                     Examination: alert and oriented. Airway Examination:                     normal oropharyngeal airway and neck mobility. Respiratory                     Examination: clear to auscultation. CV Examination: RRR,                     no murmurs, no S3 or S4. Prophylactic Antibiotics: The                     patient does not require prophylactic antibiotics. Prior                     Anticoagulants: The patient has taken no previous                     anticoagulant or antiplatelet agents. ASA Grade                     Assessment: III - A patient with severe systemic disease.                     After reviewing the risks and benefits, the patient  was          deemed in satisfactory condition to undergo the procedure.                     The anesthesia plan was to use monitored anesthesia care                     (MAC). Immediately prior to administration of medications,                     the patient was re-assessed for adequacy to receive                     sedatives. The heart rate, respiratory rate, oxygen                     saturations, blood pressure, adequacy of pulmonary                     ventilation, and response to care were monitored                     throughout the procedure. The physical status of the                     patient was re-assessed after the procedure.                    After obtaining informed consent, the colonoscope was                     passed under direct vision. Throughout the procedure, the                     patient's blood pressure, pulse, and oxygen saturations                     were monitored continuously. The Colonoscope was                     introduced through the anus and advanced to the the cecum,                     identified by appendiceal orifice and ileocecal valve. The                     colonoscopy was performed without difficulty. The patient                     tolerated the procedure well. The quality of the bowel                     preparation was good. Findings:      The perianal and digital rectal examinations were normal.      A 3 mm polyp was found in the cecum. The polyp was sessile. The polyp       was removed with a jumbo cold forceps. Resection and retrieval were       complete.      Many small and large-mouthed diverticula were found in the sigmoid       colon, in the descending colon, in the transverse colon and in the       ascending colon.      The exam was otherwise without abnormality on direct and retroflexion  views. Impression:        - One 3 mm polyp in the cecum. Resected and retrieved.                    - Diverticulosis in the sigmoid colon,  in the descending                     colon, in the transverse colon and in the ascending colon.                    - The examination was otherwise normal on direct and                     retroflexion views. Recommendation:    - Observe patient in GI recovery unit.                    - Observe patient in GI recovery unit.                    - Resume regular diet.                    - Continue present medications.                    - Follow up in GI clinic. Follow Hgb. Consider capsule                     endoscopy.                    - Start ferrous sulfate 1 tab BID.                    - The findings and recommendations were discussed with the                     patient. Procedure Code(s): --- Professional ---                    [phone], Colonoscopy, flexible; diagnostic, including                     collection of specimen(s) by brushing or washing, when                     performed (separate procedure) CPT copyright 2014 American Medical Association. All rights reserved. The codes documented in this report are preliminary and upon coder review may  be revised to meet current compliance requirements. Mellody Life, MD 12/08/2014 11:47:09 AM This report has been signed electronically. Number of Addenda: 0 Note Initiated On: 12/08/2014 11:04 AM Scope Withdrawal Time: 0 hours 11 minutes 34 seconds  Total Procedure Duration: 0 hours 15 minutes 57 seconds       [facility]

## 2014-12-08 NOTE — Interval H&P Note (Signed)
History and Physical Interval Note:  12/08/2014 11:13 AM  [name] Holmes  has presented today for surgery, with the diagnosis of melena, anemia  The various methods of treatment have been discussed with the patient and family. After consideration of risks, benefits and other options for treatment, the patient has consented to  Procedure(s): COLONOSCOPY WITH PROPOFOL (N/A) as a surgical intervention .  The patient's history has been reviewed, patient examined, no change in status, stable for surgery.  I have reviewed the patient's chart and labs.  Questions were answered to the patient's satisfaction.     REIN, MATTHEW GORDON

## 2014-12-08 NOTE — Plan of Care (Signed)
Problem: Discharge Progression Outcomes Goal: Home Health Care arrangements in place Outcome: Progressing Plan of care progress to goals: Discharge plan: pt is from home Hemodynamic: Hgb and BUN improving, IVF and Protonix continues to infuse per MD order. Diet: no c/o nausea or vomiting, pt did consume about 1/2 of bowel prep.  Activity-Pt is incontinent of bowel and bladder. Stg II pressure ulcer to coccyx, foam dressing in place.

## 2014-12-08 NOTE — Transfer of Care (Signed)
Immediate Anesthesia Transfer of Care Note  Patient: [name] Holmes  Procedure(s) Performed: Procedure(s): COLONOSCOPY WITH PROPOFOL (N/A)  Patient Location: PACU  Anesthesia Type:General  Level of Consciousness: awake, oriented and patient cooperative  Airway & Oxygen Therapy: Patient Spontanous Breathing and Patient connected to nasal cannula oxygen  Post-op Assessment: Report given to RN, Post -op Vital signs reviewed and stable and Patient moving all extremities  Post vital signs: Reviewed and stable  Last Vitals:  Filed Vitals:   12/08/14 1150  BP: 123/71  Pulse: 120  Temp: 35.9 C  Resp: 12    Complications: No apparent anesthesia complications

## 2014-12-08 NOTE — Progress Notes (Signed)
PT Cancellation Note  Patient Details Name: [name] Holmes MRN: [id number] DOB: 02-23-1935   Cancelled Treatment:    Reason Eval/Treat Not Completed: Patient at procedure or test/unavailable (Pt off floor for colonoscopy.)  Will re-attempt PT treatment at a later date/time.   Raquel Sarna Charmon Thorson 12/08/2014, 1:14 PM Leitha Bleak, [place]

## 2014-12-08 NOTE — Progress Notes (Signed)
[facility]  ROUNDING NOTE   Subjective:     Objective:  Vital signs in last 24 hours:  Temp:  [96.6 F (35.9 C)-98.8 F (37.1 C)] 97.7 F (36.5 C) (06/09 1244) Pulse Rate:  [73-122] 83 (06/09 1245) Resp:  [11-18] 12 (06/09 1220) BP: (117-155)/(68-98) 146/70 mmHg (06/09 1244) SpO2:  [99 %-100 %] 100 % (06/09 1245)  Weight change:  Filed Weights   12/05/14 1544  Weight: 56.7 kg (125 lb)    Intake/Output: I/O last 3 completed shifts: In: 2460 [P.O.:2460] Out: 250 [Urine:250]   Intake/Output this shift:  Total I/O In: 400 [I.V.:400] Out: -   Physical Exam: General: NAD,   Head: Normocephalic, atraumatic. Moist oral mucosal membranes  Eyes: Anicteric, PERRL  Neck: Supple, trachea midline  Lungs:  Clear to auscultation  Heart: Regular rate and rhythm  Abdomen:  Soft, nontender,   Extremities:  no peripheral edema.  Neurologic: Nonfocal, moving all four extremities  Skin: No lesions       Basic Metabolic Panel:  Recent Labs Lab 12/05/14 1607 12/06/14 0440 12/07/14 0521 12/08/14 0439  NA 139 138 138 138  K 4.4 3.9 4.4 4.2  CL 103 106 108 107  CO2 24 25 25 27   GLUCOSE 127* 106* 75 80  BUN 36* 34* 32* 29*  CREATININE 2.78* 2.63* 2.39* 2.31*  CALCIUM 8.3* 7.7* 7.7* 7.6*    Liver Function Tests:  Recent Labs Lab 12/05/14 1607  AST 41  ALT 16  ALKPHOS 67  BILITOT 0.6  PROT 6.1*  ALBUMIN 2.5*   No results for input(s): LIPASE, AMYLASE in the last 168 hours. No results for input(s): AMMONIA in the last 168 hours.  CBC:  Recent Labs Lab 12/05/14 1607 12/06/14 0440 12/06/14 1653 12/06/14 2006 12/06/14 2351 12/07/14 0521 12/08/14 0439  WBC 7.3 7.2  --   --   --  7.1 6.6  NEUTROABS 4.5  --   --   --   --   --   --   HGB 7.9* 8.4* 9.4* 8.7* 8.1* 8.2* 8.3*  HCT 24.4* 25.3* 29.2* 26.4* 25.4* 25.3* 25.7*  MCV 93.7 90.7  --   --   --  90.8 91.6  PLT 223 195  --   --   --  182 175    Cardiac Enzymes:  Recent Labs Lab  12/05/14 1607  TROPONINI <0.03    BNP: Invalid input(s): POCBNP  CBG: No results for input(s): GLUCAP in the last 168 hours.  Microbiology: Results for orders placed or performed during the hospital encounter of 12/05/14  C difficile quick scan w PCR reflex ([place] only)     Status: None   Collection Time: 12/06/14 11:06 AM  Result Value Ref Range Status   C Diff antigen POSITIVE  Final   C Diff toxin NEGATIVE  Final   C Diff interpretation   Final    Negative for toxigenic C. difficile. Toxin gene and active toxin production not detected. May be a nontoxigenic strain of C. difficile bacteria present, lacking the ability to produce toxin.  Stool culture     Status: None (Preliminary result)   Collection Time: 12/06/14 11:06 AM  Result Value Ref Range Status   Specimen Description STOOL  Final   Special Requests NONE  Final   Culture NO SALMONELLA OR SHIGELLA ISOLATED  Final   Report Status PENDING  Incomplete  Clostridium Difficile by PCR (not at [facility])     Status: None   Collection Time:  12/06/14 11:06 AM  Result Value Ref Range Status   C difficile by pcr NEGATIVE NEGATIVE Final  Urine culture     Status: None (Preliminary result)   Collection Time: 12/07/14  4:46 AM  Result Value Ref Range Status   Specimen Description URINE, CATHETERIZED  Final   Special Requests NONE  Final   Culture   Final    >=100,000 COLONIES/mL GRAM NEGATIVE RODS IDENTIFICATION AND SUSCEPTIBILITIES TO FOLLOW    Report Status PENDING  Incomplete    Coagulation Studies: No results for input(s): LABPROT, INR in the last 72 hours.  Urinalysis: No results for input(s): COLORURINE, LABSPEC, PHURINE, GLUCOSEU, HGBUR, BILIRUBINUR, KETONESUR, PROTEINUR, UROBILINOGEN, NITRITE, LEUKOCYTESUR in the last 72 hours.  Invalid input(s): APPERANCEUR    Imaging: US Renal  12/07/2014   CLINICAL DATA:  Acute kidney injury  EXAM: RENAL / URINARY TRACT ULTRASOUND COMPLETE  COMPARISON:  08/09/2014  FINDINGS: Right  Kidney:  Length: 10.3 cm. Echogenicity within normal limits. No mass or hydronephrosis visualized.  Left Kidney:  Length: 10.0 cm. Normal cortical echogenicity. No hydronephrosis. There is a 2.1 x 1.7 x 1.9 cm hypoechoic mass with little if any internal color flow vascularity in the lower pole.  Bladder:  Appears normal for degree of bladder distention.  IMPRESSION: Left lower pole renal mass.  MRI is recommended.   Electronically Signed   By: Marybelle Killings M.D.   On: 12/07/2014 18:05     Medications:   . pantoprozole (PROTONIX) infusion 8 mg/hr (12/07/14 2235)   . allopurinol  100 mg Oral Daily  . cholecalciferol  1,000 Units Oral Daily  . febuxostat  40 mg Oral Daily  . folic acid  1 mg Oral Daily  . levothyroxine  50 mcg Oral q morning - 10a  . magnesium oxide  400 mg Oral Daily  . oxybutynin  5 mg Oral QHS  . polyethylene glycol powder  1 Container Oral Once  . potassium chloride  10 mEq Oral Daily  . sodium chloride  3 mL Intravenous Q12H  . thiamine  100 mg Oral Daily   sodium chloride, acetaminophen, alum & mag hydroxide-simeth, diphenhydrAMINE, docusate sodium, HYDROcodone-acetaminophen, ondansetron **OR** ondansetron (ZOFRAN) IV, sodium chloride  Assessment/ Plan:  Ms. [name] Holmes is a 80 y.o.  female chronic alcoholism, anemia, gout, hypertension admitted on 12/05/2014 for diarreha.   1. Acute renal failure on chronic kidney disease stage III with proteinuria: Acute renal failure from GI losses. Baseline creatinine of 1.7 from 2015. Patient's creatinine is actually the best it has been in several months.  Renally dose all medications.  No acute indication for dialysis at this time.  Supportive care  2. Acute Blood loss anemia on chronic kidney disease: hemoglobin 8.3 Status post colonoscopy. GI bleed not identified.    LOS: [place], [place] 6/9/20162:30 PM

## 2014-12-08 NOTE — Progress Notes (Signed)
Pt went for colonoscopy today.tol well. For discharge this pm. Discharge instructions discussed with pt.  meds discussed. Diet/ activity and f/u discussed.  Pt verbalize understanding. Pt waiting for niece to come and pick her up after she get off work. Iv site d/cd.  Up in chair.  Prn dor itching with relief.

## 2014-12-08 NOTE — Anesthesia Postprocedure Evaluation (Signed)
  Anesthesia Post-op Note  Patient: [name] Holmes  Procedure(s) Performed: Procedure(s): COLONOSCOPY WITH PROPOFOL (N/A)  Anesthesia type:General  Patient location: PACU  Post pain: Pain level controlled  Post assessment: Post-op Vital signs reviewed, Patient's Cardiovascular Status Stable, Respiratory Function Stable, Patent Airway and No signs of Nausea or vomiting  Post vital signs: Reviewed and stable  Last Vitals:  Filed Vitals:   12/08/14 1200  BP: 126/74  Pulse: 110  Temp:   Resp: 15    Level of consciousness: awake, alert  and patient cooperative  Complications: No apparent anesthesia complications

## 2014-12-08 NOTE — Anesthesia Preprocedure Evaluation (Signed)
Anesthesia Evaluation  Patient identified by MRN, date of birth, ID band Patient awake    Reviewed: Allergy & Precautions, NPO status , Patient's Chart, lab work & pertinent test results  History of Anesthesia Complications Negative for: history of anesthetic complications  Airway Mallampati: III  TM Distance: >3 FB Neck ROM: Full    Dental  (+) Poor Dentition, Edentulous Upper   Pulmonary neg pulmonary ROS, former smoker,  breath sounds clear to auscultation  Pulmonary exam normal       Cardiovascular Exercise Tolerance: Poor hypertension, Pt. on medications Normal cardiovascular examRhythm:Regular Rate:Normal     Neuro/Psych negative neurological ROS  negative psych ROS   GI/Hepatic negative GI ROS, Neg liver ROS,   Endo/Other  Hypothyroidism   Renal/GU CRFRenal disease  negative genitourinary   Musculoskeletal negative musculoskeletal ROS (+)   Abdominal   Peds negative pediatric ROS (+)  Hematology  (+) anemia ,   Anesthesia Other Findings   Reproductive/Obstetrics negative OB ROS                             Anesthesia Physical Anesthesia Plan  ASA: III  Anesthesia Plan: General   Post-op Pain Management:    Induction: Intravenous  Airway Management Planned: Nasal Cannula  Additional Equipment:   Intra-op Plan:   Post-operative Plan:   Informed Consent: I have reviewed the patients History and Physical, chart, labs and discussed the procedure including the risks, benefits and alternatives for the proposed anesthesia with the patient or authorized representative who has indicated his/her understanding and acceptance.     Plan Discussed with: CRNA and Surgeon  Anesthesia Plan Comments:         Anesthesia Quick Evaluation

## 2014-12-08 NOTE — Discharge Instructions (Signed)
DIET:  Cardiac diet  DISCHARGE CONDITION:  Stable  ACTIVITY:  Activity as tolerated  OXYGEN:  Home Oxygen: No.   Oxygen Delivery: room air  DISCHARGE LOCATION:  Home with home health    ADDITIONAL DISCHARGE INSTRUCTION: resume home health   If you experience worsening of your admission symptoms, develop shortness of breath, life threatening emergency, suicidal or homicidal thoughts you must seek medical attention immediately by calling 911 or calling your MD immediately  if symptoms less severe.  You Must read complete instructions/literature along with all the possible adverse reactions/side effects for all the Medicines you take and that have been prescribed to you. Take any new Medicines after you have completely understood and accpet all the possible adverse reactions/side effects.   Please note  You were cared for by a hospitalist during your hospital stay. If you have any questions about your discharge medications or the care you received while you were in the hospital after you are discharged, you can call the unit and asked to speak with the hospitalist on call if the hospitalist that took care of you is not available. Once you are discharged, your primary care physician will handle any further medical issues. Please note that NO REFILLS for any discharge medications will be authorized once you are discharged, as it is imperative that you return to your primary care physician (or establish a relationship with a primary care physician if you do not have one) for your aftercare needs so that they can reassess your need for medications and monitor your lab values.   Bloody Stools Bloody stools means there is blood in your poop (stool). It is a sign that there is a problem somewhere in the digestive system. It is important for your doctor to find the cause of your bleeding, so the problem can be treated.  HOME CARE  Only take medicine as told by your doctor.  Eat foods with  fiber (prunes, bran cereals).  Drink enough fluids to keep your pee (urine) clear or pale yellow.  Sit in warm water (sitz bath) for 10 to 15 minutes as told by your doctor.  Know how to take your medicines (enemas, suppositories) if advised by your doctor.  Watch for signs that you are getting better or getting worse. GET HELP RIGHT AWAY IF:   You are not getting better.  You start to get better but then get worse again.  You have new problems.  You have severe bleeding from the place where poop comes out (rectum) that does not stop.  You throw up (vomit) blood.  You feel weak or pass out (faint).  You have a fever. MAKE SURE YOU:   Understand these instructions.  Will watch your condition.  Will get help right away if you are not doing well or get worse. Document Released: 06/05/2009 Document Revised: 09/09/2011 Document Reviewed: 11/02/2010 [facility] Patient Information 2015 [place], [place]. This information is not intended to replace advice given to you by your health care provider. Make sure you discuss any questions you have with your health care provider.  Gastrointestinal Bleeding Gastrointestinal bleeding is bleeding somewhere along the path that food travels through the body (digestive tract). This path is anywhere between the mouth and the opening of the butt (anus). You may have blood in your throw up (vomit) or in your poop (stools). If there is a lot of bleeding, you may need to stay in the hospital. [place]  Only take medicine as told by your  doctor.  Eat foods with fiber such as whole grains, fruits, and vegetables. You can also try eating 1 to 3 prunes a day.  Drink enough fluids to keep your pee (urine) clear or pale yellow. GET HELP RIGHT AWAY IF:   Your bleeding gets worse.  You feel dizzy, weak, or you pass out (faint).  You have bad cramps in your back or belly (abdomen).  You have large blood clumps (clots) in your poop.  Your problems are  getting worse. MAKE SURE YOU:   Understand these instructions.  Will watch your condition.  Will get help right away if you are not doing well or get worse. Document Released: 03/26/2008 Document Revised: 06/03/2012 Document Reviewed: 05/27/2011 [facility] Patient Information 2015 [place], [place]. This information is not intended to replace advice given to you by your health care provider. Make sure you discuss any questions you have with your health care provider.

## 2014-12-08 NOTE — Care Management (Signed)
Home Health orders faxed to [facility]. Floydene Flock, representative for [facility] updated. Shelbie Ammons RN MSN Care Management [phone]

## 2014-12-08 NOTE — Progress Notes (Signed)
Called pts home and left message for her to call  Hospital 1[facility]  At [phone] and to speak with Drucilla MA:YOKHT left at hospital.

## 2014-12-08 NOTE — H&P (View-Only) (Signed)
GI Note:  Off the floor for study. Hgb stable.  No more dark stool per nursing.    Recs: - colonoscopy tomorrow - miralax 255 grams tonight and 255 grams in am - must drink am prep from 5 am to 7 am.  NPO after 7 am.

## 2014-12-09 ENCOUNTER — Encounter: Payer: Self-pay | Admitting: Gastroenterology

## 2014-12-09 LAB — URINE CULTURE: Culture: >=100000

## 2014-12-09 LAB — SURGICAL PATHOLOGY

## 2014-12-09 NOTE — Discharge Summary (Signed)
[name] Holmes, 80 y.o., DOB [birth year], MRN [id number]. Admission date: 12/05/2014 Discharge Date 12/09/2014 Primary MD Marguerita Merles, MD Admitting Physician Dustin Flock, MD  Admission Diagnosis  Acute blood loss anemia [D62] Decubitus ulcer of sacral region, stage 1 [L89.151] Gastrointestinal hemorrhage, unspecified gastritis, unspecified gastrointestinal hemorrhage type [K92.2]  Discharge Diagnosis   Active Problems:   Anemia   Weakness   Pressure ulcer     Hospital Course [name] Holmes is a 80 y.o. female with a known history of alcohol abuse in the past who has had multiple visits with complain a weakness in the emergency room. Patient's recently was arranged to go to skilled nursing facility from the emergency room. Patient was therefore brief amount of time and then left. She came back to the emergency room  complaining of generalized weakness. Patient was noted to have a hemoglobin drop. She was admitted for further workup and a possible GI bleed. Patient was transfused and further evaluation was done with GI. She had a colonoscopy which showed a 3 mm polyp and diverticulosis in the colon. No active bleed was noted. Patient also was noted to acute renal failure that resolved with IV fluids. She has been referred to a skilled nursing facility in the past but she left after being there for a few days. Again she was offered by case manager for rehabilitation that she wanted to go home. At this time home health with physical therapy has been arranged.            Consults  GI  Significant Tests:  See full reports for all details    US Renal  12/07/2014   CLINICAL DATA:  Acute kidney injury  EXAM: RENAL / URINARY TRACT ULTRASOUND COMPLETE  COMPARISON:  08/09/2014  FINDINGS: Right Kidney:  Length: 10.3 cm. Echogenicity within normal limits. No mass or hydronephrosis visualized.  Left Kidney:  Length: 10.0 cm. Normal cortical echogenicity. No hydronephrosis. There is a 2.1 x 1.7 x  1.9 cm hypoechoic mass with little if any internal color flow vascularity in the lower pole.  Bladder:  Appears normal for degree of bladder distention.  IMPRESSION: Left lower pole renal mass.  MRI is recommended.   Electronically Signed   By: [name] Holmes M.D.   On: 12/07/2014 18:05       Today   Subjective:   [name] Holmes feels well and wants to go home today.  Objective:   Blood pressure 146/70, pulse 83, temperature 97.7 F (36.5 C), temperature source Oral, resp. rate 12, height 5\' 4"  (1.626 m), weight 56.7 kg (125 lb), SpO2 100 %.  .  Intake/Output Summary (Last 24 hours) at 12/09/14 1054 Last data filed at 12/08/14 1137  Gross per 24 hour  Intake    400 ml  Output      0 ml  Net    400 ml    Exam VITAL SIGNS: Blood pressure 146/70, pulse 83, temperature 97.7 F (36.5 C), temperature source Oral, resp. rate 12, height 5\' 4"  (1.626 m), weight 56.7 kg (125 lb), SpO2 100 %.  GENERAL:  80 y.o.-year-old patient lying in the bed with no acute distress.  EYES: Pupils equal, round, reactive to light and accommodation. No scleral icterus. Extraocular muscles intact.  HEENT: Head atraumatic, normocephalic. Oropharynx and nasopharynx clear.  NECK:  Supple, no jugular venous distention. No thyroid enlargement, no tenderness.  LUNGS: Normal breath sounds bilaterally, no wheezing, rales,rhonchi or crepitation. No use of accessory muscles of respiration.  CARDIOVASCULAR: S1, S2  normal. No murmurs, rubs, or gallops.  ABDOMEN: Soft, nontender, nondistended. Bowel sounds present. No organomegaly or mass.  EXTREMITIES: No pedal edema, cyanosis, or clubbing.  NEUROLOGIC: Cranial nerves II through XII are intact. Muscle strength 5/5 in all extremities. Sensation intact. Gait not checked.  PSYCHIATRIC: The patient is alert and oriented x 3.  SKIN: No obvious rash, lesion, or ulcer.   Data Review     CBC w Diff: Lab Results  Component Value Date   WBC 6.6 12/08/2014   WBC 9.3  06/06/2014   HGB 8.3* 12/08/2014   HGB 9.1* 06/06/2014   HCT 25.7* 12/08/2014   HCT 28.0* 06/06/2014   PLT 175 12/08/2014   PLT 225 06/06/2014   LYMPHOPCT 24 12/05/2014   LYMPHOPCT 11.4 04/07/2014   MONOPCT 9 12/05/2014   MONOPCT 5.1 04/07/2014   EOSPCT 4 12/05/2014   EOSPCT 0.5 04/07/2014   BASOPCT 1 12/05/2014   BASOPCT 0.9 04/07/2014   CMP: Lab Results  Component Value Date   NA 138 12/08/2014   NA 141 06/06/2014   K 4.2 12/08/2014   K 5.1 06/06/2014   CL 107 12/08/2014   CL 104 06/06/2014   CO2 27 12/08/2014   CO2 22 06/06/2014   BUN 29* 12/08/2014   BUN 22* 06/06/2014   CREATININE 2.31* 12/08/2014   CREATININE 1.38* 06/06/2014   PROT 6.1* 12/05/2014   PROT 6.9 06/06/2014   ALBUMIN 2.5* 12/05/2014   ALBUMIN 2.4* 06/06/2014   BILITOT 0.6 12/05/2014   ALKPHOS 67 12/05/2014   ALKPHOS 71 06/06/2014   AST 41 12/05/2014   AST 53* 06/06/2014   ALT 16 12/05/2014   ALT 25 06/06/2014  .  Micro Results Recent Results (from the past 240 hour(s))  C difficile quick scan w PCR reflex ([facility] only)     Status: None   Collection Time: 12/06/14 11:06 AM  Result Value Ref Range Status   C Diff antigen POSITIVE  Final   C Diff toxin NEGATIVE  Final   C Diff interpretation   Final    Negative for toxigenic C. difficile. Toxin gene and active toxin production not detected. May be a nontoxigenic strain of C. difficile bacteria present, lacking the ability to produce toxin.  Stool culture     Status: None (Preliminary result)   Collection Time: 12/06/14 11:06 AM  Result Value Ref Range Status   Specimen Description STOOL  Final   Special Requests NONE  Final   Culture   Final    NO SALMONELLA OR SHIGELLA ISOLATED NO CAMPYLOBACTER DETECTED    Report Status PENDING  Incomplete  Clostridium Difficile by PCR (not at [facility])     Status: None   Collection Time: 12/06/14 11:06 AM  Result Value Ref Range Status   C difficile by pcr NEGATIVE NEGATIVE Final  Urine culture     Status:  None   Collection Time: 12/07/14  4:46 AM  Result Value Ref Range Status   Specimen Description URINE, CATHETERIZED  Final   Special Requests NONE  Final   Culture >=100,000 COLONIES/mL ESCHERICHIA COLI  Final   Report Status 12/09/2014 FINAL  Final   Organism ID, Bacteria ESCHERICHIA COLI  Final      Susceptibility   Escherichia coli - MIC*    AMPICILLIN >=32 RESISTANT Resistant     CEFTAZIDIME <=1 SENSITIVE Sensitive     CEFAZOLIN <=4 SENSITIVE Sensitive     CEFTRIAXONE <=1 SENSITIVE Sensitive     CIPROFLOXACIN >=4 RESISTANT Resistant  GENTAMICIN <=1 SENSITIVE Sensitive     IMIPENEM <=0.25 SENSITIVE Sensitive     TRIMETH/SULFA >=320 RESISTANT Resistant     CEFOXITIN <=4 SENSITIVE Sensitive     * >=100,000 COLONIES/mL ESCHERICHIA COLI           Follow-up Information    Follow up with Marguerita Merles, MD In 7 days.   Specialty:  Family Medicine   Contact information:   [place] [address] [phone]       Follow up with Leia Alf, MD In 2 weeks.   Specialty:  Internal Medicine   Why:  anemia   Contact information:   [address] [place] [address] [phone]       Discharge Medications     Medication List    TAKE these medications        acetaminophen 325 MG tablet  Commonly known as:  TYLENOL  Take 650 mg by mouth every 6 (six) hours as needed for mild pain or headache.     docusate sodium 100 MG capsule  Commonly known as:  COLACE  Take 100 mg by mouth at bedtime as needed for mild constipation.     febuxostat 40 MG tablet  Commonly known as:  ULORIC  Take 40 mg by mouth daily.     ferrous sulfate 325 (65 FE) MG tablet  Commonly known as:  FERROUSUL  Take 1 tablet (325 mg total) by mouth 2 (two) times daily with a meal.     folic acid 1 MG tablet  Commonly known as:  FOLVITE  Take 1 mg by mouth daily.     furosemide 40 MG tablet  Commonly known as:  LASIX  Take 40 mg by mouth every  morning.     levothyroxine 50 MCG tablet  Commonly known as:  SYNTHROID, LEVOTHROID  Take 50 mcg by mouth every morning.     loperamide 2 MG tablet  Commonly known as:  IMODIUM A-D  Take 2 mg by mouth 4 (four) times daily as needed for diarrhea or loose stools.     magnesium oxide 400 MG tablet  Commonly known as:  MAG-OX  Take 400 mg by mouth daily.     omeprazole 20 MG capsule  Commonly known as:  PRILOSEC  Take 20 mg by mouth 2 (two) times daily.     oxybutynin 5 MG 24 hr tablet  Commonly known as:  DITROPAN-XL  Take 5 mg by mouth at bedtime.     polyethylene glycol packet  Commonly known as:  MIRALAX / GLYCOLAX  Take 17 g by mouth daily as needed for moderate constipation.     potassium chloride 10 MEQ tablet  Commonly known as:  K-DUR,KLOR-CON  Take 10 mEq by mouth daily.     thiamine 100 MG tablet  Take 100 mg by mouth daily.           Total Time in preparing paper work, data evaluation and todays exam - 35 minutes  Dustin Flock M.D on 12/09/2014 at 10:54 AM  [facility]   Office  [phone]

## 2014-12-15 ENCOUNTER — Emergency Department
Admission: EM | Admit: 2014-12-15 | Discharge: 2014-12-15 | Disposition: A | Discharge status: Home / Self Care | Payer: Medicare Other | Attending: Emergency Medicine | Admitting: Emergency Medicine

## 2014-12-15 ENCOUNTER — Encounter: Payer: Self-pay | Admitting: Urgent Care

## 2014-12-15 DIAGNOSIS — Z7952 Long term (current) use of systemic steroids: Secondary | ICD-10-CM | POA: Insufficient documentation

## 2014-12-15 DIAGNOSIS — Z79899 Other long term (current) drug therapy: Secondary | ICD-10-CM | POA: Insufficient documentation

## 2014-12-15 DIAGNOSIS — M109 Gout, unspecified: Secondary | ICD-10-CM | POA: Insufficient documentation

## 2014-12-15 DIAGNOSIS — I10 Essential (primary) hypertension: Secondary | ICD-10-CM | POA: Insufficient documentation

## 2014-12-15 DIAGNOSIS — Z87891 Personal history of nicotine dependence: Secondary | ICD-10-CM | POA: Diagnosis not present

## 2014-12-15 MED ORDER — PREDNISONE 20 MG PO TABS: 60.0000 mg | ORAL_TABLET | Freq: Every day | ORAL | Status: DC

## 2014-12-15 MED ORDER — PREDNISONE 20 MG PO TABS
60.0000 mg | ORAL_TABLET | Freq: Once | ORAL | Status: AC
  Administered 2014-12-15: 60 mg via ORAL

## 2014-12-15 MED ORDER — PREDNISONE 20 MG PO TABS
ORAL_TABLET | ORAL | Status: AC
Start: 2014-12-15 — End: 2014-12-15
  Administered 2014-12-15: 60 mg via ORAL
  Filled 2014-12-15: qty 3

## 2014-12-15 NOTE — Discharge Instructions (Signed)

## 2014-12-15 NOTE — ED Notes (Addendum)
Patient presents vis EMS to ED. Patient reporting that she has gout and it has been flaring up x 2-3 weeks. Patient states, "They aint no pain right now. It is just swelling up on me. The skin has been coming off and I think I need to have it checked." NOS reported at this time.

## 2014-12-15 NOTE — ED Provider Notes (Signed)
[facility] Emergency Department Provider Note  ____________________________________________  Time seen: 3:00 AM  I have reviewed the triage vital signs and the nursing notes.   HISTORY  Chief Complaint Gout      HPI [name] Holmes is a 80 y.o. female presents stating that she is having a "flareup of her gout". Patient admits to ongoing right foot pain 5 days. Patient denies fever no shortness of breath no chest pain.     Past Medical History  Diagnosis Date  . Arthritis   . Hypertension   . Anemia   . Gout   . ETOH abuse     Patient Active Problem List   Diagnosis Date Noted  . Pressure ulcer 12/06/2014  . Weakness 12/05/2014  . H/O ETOH abuse 12/05/2014  . HTN (hypertension) 12/05/2014  . Gout 12/05/2014  . Acute on chronic kidney failure 11/03/2014  . UTI (lower urinary tract infection) 11/03/2014  . Anemia     Past Surgical History  Procedure Laterality Date  . Abdominal hysterectomy    . Esophagogastroduodenoscopy (egd) with propofol N/A 11/08/2014    Procedure: ESOPHAGOGASTRODUODENOSCOPY (EGD) WITH PROPOFOL;  Surgeon: Lollie Sails, MD;  Location: [facility] ENDOSCOPY;  Service: Endoscopy;  Laterality: N/A;  . Colonoscopy with propofol N/A 12/08/2014    Procedure: COLONOSCOPY WITH PROPOFOL;  Surgeon: Josefine Class, MD;  Location: [facility] ENDOSCOPY;  Service: Endoscopy;  Laterality: N/A;    Current Outpatient Rx  Name  Route  Sig  Dispense  Refill  . acetaminophen (TYLENOL) 325 MG tablet   Oral   Take 650 mg by mouth every 6 (six) hours as needed for mild pain or headache.          . docusate sodium (COLACE) 100 MG capsule   Oral   Take 100 mg by mouth at bedtime as needed for mild constipation.          . febuxostat (ULORIC) 40 MG tablet   Oral   Take 40 mg by mouth daily.         . ferrous sulfate (FERROUSUL) 325 (65 FE) MG tablet   Oral   Take 1 tablet (325 mg total) by mouth 2 (two) times daily with a meal.  60 tablet   0   . folic acid (FOLVITE) 1 MG tablet   Oral   Take 1 mg by mouth daily.         . furosemide (LASIX) 40 MG tablet   Oral   Take 40 mg by mouth every morning.          Marland Kitchen levothyroxine (SYNTHROID, LEVOTHROID) 50 MCG tablet   Oral   Take 50 mcg by mouth every morning.          . loperamide (IMODIUM A-D) 2 MG tablet   Oral   Take 2 mg by mouth 4 (four) times daily as needed for diarrhea or loose stools.         . magnesium oxide (MAG-OX) 400 MG tablet   Oral   Take 400 mg by mouth daily.         Marland Kitchen omeprazole (PRILOSEC) 20 MG capsule   Oral   Take 20 mg by mouth 2 (two) times daily.          Marland Kitchen oxybutynin (DITROPAN-XL) 5 MG 24 hr tablet   Oral   Take 5 mg by mouth at bedtime.         . polyethylene glycol (MIRALAX / GLYCOLAX) packet  Oral   Take 17 g by mouth daily as needed for moderate constipation.          . potassium chloride (K-DUR,KLOR-CON) 10 MEQ tablet   Oral   Take 10 mEq by mouth daily.         . predniSONE (DELTASONE) 20 MG tablet   Oral   Take 3 tablets (60 mg total) by mouth daily with breakfast.   15 tablet   0   . thiamine 100 MG tablet   Oral   Take 100 mg by mouth daily.           Allergies Review of patient's allergies indicates no known allergies.  Family History  Problem Relation Age of Onset  . Hypertension Father     Social History History  Substance Use Topics  . Smoking status: Former Research scientist (life sciences)  . Smokeless tobacco: Not on file  . Alcohol Use: 7.2 oz/week    6 Cans of beer, 6 Shots of liquor per week    Review of Systems  Constitutional: Negative for fever. Eyes: Negative for visual changes. ENT: Negative for sore throat. Cardiovascular: Negative for chest pain. Respiratory: Negative for shortness of breath. Gastrointestinal: Negative for abdominal pain, vomiting and diarrhea. Genitourinary: Negative for dysuria. Musculoskeletal: Negative for back pain. Positive right foot pain Skin:  Negative for rash. Neurological: Negative for headaches, focal weakness or numbness.   10-point ROS otherwise negative.  ____________________________________________   PHYSICAL EXAM:  VITAL SIGNS: ED Triage Vitals  Enc Vitals Group     BP 12/15/14 0141 112/59 mmHg     Pulse Rate 12/15/14 0141 97     Resp 12/15/14 0141 16     Temp 12/15/14 0141 98.2 F (36.8 C)     Temp Source 12/15/14 0141 Oral     SpO2 12/15/14 0141 98 %     Weight 12/15/14 0141 135 lb (61.236 kg)     Height 12/15/14 0141 5\' 3"  (1.6 m)     Head Cir --      Peak Flow --      Pain Score 12/15/14 0142 0     Pain Loc --      Pain Edu? --      Excl. in [place]? --      Constitutional: Alert and oriented. Well appearing and in no distress. Eyes: Conjunctivae are normal. PERRL. Normal extraocular movements. ENT   Head: Normocephalic and atraumatic.   Nose: No congestion/rhinnorhea.   Mouth/Throat: Mucous membranes are moist.   Neck: No stridor. Hematological/Lymphatic/Immunilogical: No cervical lymphadenopathy. Cardiovascular: Normal rate, regular rhythm. Normal and symmetric distal pulses are present in all extremities. No murmurs, rubs, or gallops. Respiratory: Normal respiratory effort without tachypnea nor retractions. Breath sounds are clear and equal bilaterally. No wheezes/rales/rhonchi. Gastrointestinal: Soft and nontender. No distention. There is no CVA tenderness. Genitourinary: deferred Musculoskeletal: Nontender with normal range of motion in all extremities. No joint effusions. Right foot tenderness to palpation worse at the right great toe. Positive swelling as well Neurologic:  Normal speech and language. No gross focal neurologic deficits are appreciated. Speech is normal.  Skin:  Skin is warm, dry and intact. No rash noted. Psychiatric: Mood and affect are normal. Speech and behavior are normal. Patient exhibits appropriate insight and judgment.    INITIAL IMPRESSION / ASSESSMENT  AND PLAN / ED COURSE  Pertinent labs & imaging results that were available during my care of the patient were reviewed by me and considered in my medical decision making (see chart  for details).  History of physical exam consistent with acute gout exacerbation patient received prednisone in the emergency department we prescribed the same at home  ____________________________________________   FINAL CLINICAL IMPRESSION(S) / ED DIAGNOSES  Final diagnoses:  Acute gout of right foot, unspecified cause      Gregor Hams, MD 12/15/14 [phone]

## 2015-01-04 LAB — STOOL CULTURE

## 2015-03-22 ENCOUNTER — Emergency Department
Admission: EM | Admit: 2015-03-22 | Discharge: 2015-03-22 | Disposition: A | Discharge status: Home / Self Care | Payer: Medicare Other | Attending: Emergency Medicine | Admitting: Emergency Medicine

## 2015-03-22 ENCOUNTER — Encounter: Payer: Self-pay | Admitting: Emergency Medicine

## 2015-03-22 DIAGNOSIS — Z87891 Personal history of nicotine dependence: Secondary | ICD-10-CM | POA: Diagnosis not present

## 2015-03-22 DIAGNOSIS — Z7952 Long term (current) use of systemic steroids: Secondary | ICD-10-CM | POA: Insufficient documentation

## 2015-03-22 DIAGNOSIS — I129 Hypertensive chronic kidney disease with stage 1 through stage 4 chronic kidney disease, or unspecified chronic kidney disease: Secondary | ICD-10-CM | POA: Diagnosis not present

## 2015-03-22 DIAGNOSIS — Z79899 Other long term (current) drug therapy: Secondary | ICD-10-CM | POA: Diagnosis not present

## 2015-03-22 DIAGNOSIS — N189 Chronic kidney disease, unspecified: Secondary | ICD-10-CM | POA: Insufficient documentation

## 2015-03-22 DIAGNOSIS — D649 Anemia, unspecified: Secondary | ICD-10-CM | POA: Diagnosis not present

## 2015-03-22 LAB — BASIC METABOLIC PANEL
Anion gap: 8 (ref 5–15)
BUN: 15 mg/dL (ref 6–20)
CO2: 21 mmol/L — ABNORMAL LOW (ref 22–32)
CREATININE: 2.19 mg/dL — AB (ref 0.44–1.00)
Calcium: 8.4 mg/dL — ABNORMAL LOW (ref 8.9–10.3)
Chloride: 106 mmol/L (ref 101–111)
GFR calc Af Amer: 24 mL/min — ABNORMAL LOW (ref >60)
GFR, EST NON AFRICAN AMERICAN: 20 mL/min — AB (ref >60)
Glucose, Bld: 82 mg/dL (ref 65–99)
POTASSIUM: 4.4 mmol/L (ref 3.5–5.1)
Sodium: 135 mmol/L (ref 135–145)

## 2015-03-22 LAB — CBC
HCT: 26.2 % — ABNORMAL LOW (ref 35.0–47.0)
Hemoglobin: 8.6 g/dL — ABNORMAL LOW (ref 12.0–16.0)
MCH: 30.9 pg (ref 26.0–34.0)
MCHC: 32.7 g/dL (ref 32.0–36.0)
MCV: 94.4 fL (ref 80.0–100.0)
Platelets: 125 10*3/uL — ABNORMAL LOW (ref 150–440)
RBC: 2.78 MIL/uL — AB (ref 3.80–5.20)
RDW: 15.6 % — ABNORMAL HIGH (ref 11.5–14.5)
WBC: 3.6 10*3/uL (ref 3.6–11.0)

## 2015-03-22 NOTE — Discharge Instructions (Signed)
Please seek medical attention for any high fevers, chest pain, shortness of breath, change in behavior, persistent vomiting, bloody stool or any other new or concerning symptoms.   Anemia, Nonspecific Anemia is a condition in which the concentration of red blood cells or hemoglobin in the blood is below normal. Hemoglobin is a substance in red blood cells that carries oxygen to the tissues of the body. Anemia results in not enough oxygen reaching these tissues.  CAUSES  Common causes of anemia include:   Excessive bleeding. Bleeding may be internal or external. This includes excessive bleeding from periods (in women) or from the intestine.   Poor nutrition.   Chronic kidney, thyroid, and liver disease.  Bone marrow disorders that decrease red blood cell production.  Cancer and treatments for cancer.  HIV, AIDS, and their treatments.  Spleen problems that increase red blood cell destruction.  Blood disorders.  Excess destruction of red blood cells due to infection, medicines, and autoimmune disorders. SIGNS AND SYMPTOMS   Minor weakness.   Dizziness.   Headache.  Palpitations.   Shortness of breath, especially with exercise.   Paleness.  Cold sensitivity.  Indigestion.  Nausea.  Difficulty sleeping.  Difficulty concentrating. Symptoms may occur suddenly or they may develop slowly.  DIAGNOSIS  Additional blood tests are often needed. These help your health care provider determine the best treatment. Your health care provider will check your stool for blood and look for other causes of blood loss.  TREATMENT  Treatment varies depending on the cause of the anemia. Treatment can include:   Supplements of iron, vitamin N05, or folic acid.   Hormone medicines.   A blood transfusion. This may be needed if blood loss is severe.   Hospitalization. This may be needed if there is significant continual blood loss.   Dietary changes.  Spleen removal. HOME  CARE INSTRUCTIONS Keep all follow-up appointments. It often takes many weeks to correct anemia, and having your health care provider check on your condition and your response to treatment is very important. SEEK IMMEDIATE MEDICAL CARE IF:   You develop extreme weakness, shortness of breath, or chest pain.   You become dizzy or have trouble concentrating.  You develop heavy vaginal bleeding.   You develop a rash.   You have bloody or black, tarry stools.   You faint.   You vomit up blood.   You vomit repeatedly.   You have abdominal pain.  You have a fever or persistent symptoms for more than 2-3 days.   You have a fever and your symptoms suddenly get worse.   You are dehydrated.  MAKE SURE YOU:  Understand these instructions.  Will watch your condition.  Will get help right away if you are not doing well or get worse. Document Released: 07/25/2004 Document Revised: 02/17/2013 Document Reviewed: 12/11/2012 [facility] Patient Information 2015 [place], [place]. This information is not intended to replace advice given to you by your health care provider. Make sure you discuss any questions you have with your health care provider.

## 2015-03-22 NOTE — ED Provider Notes (Signed)
[facility] Emergency Department Provider Note    ____________________________________________  Time seen: 1325  I have reviewed the triage vital signs and the nursing notes.   HISTORY  Chief Complaint Anemia   History limited by: Not Limited   HPI CHRISMA HURLOCK is a 80 y.o. female sent to the emergency department today from home by home health care because of concerns for anemia and possible need for transfusion. Patient herself states that she has not had any abdominal pain. She denies noticing any black or tarry stool. She denies any chest pain or shortness of breath. Denies any fatigue or weakness.   Past Medical History  Diagnosis Date  . Arthritis   . Hypertension   . Anemia   . Gout   . ETOH abuse     Patient Active Problem List   Diagnosis Date Noted  . Pressure ulcer 12/06/2014  . Weakness 12/05/2014  . H/O ETOH abuse 12/05/2014  . HTN (hypertension) 12/05/2014  . Gout 12/05/2014  . Acute on chronic kidney failure 11/03/2014  . UTI (lower urinary tract infection) 11/03/2014  . Anemia     Past Surgical History  Procedure Laterality Date  . Abdominal hysterectomy    . Esophagogastroduodenoscopy (egd) with propofol N/A 11/08/2014    Procedure: ESOPHAGOGASTRODUODENOSCOPY (EGD) WITH PROPOFOL;  Surgeon: Lollie Sails, MD;  Location: [facility] ENDOSCOPY;  Service: Endoscopy;  Laterality: N/A;  . Colonoscopy with propofol N/A 12/08/2014    Procedure: COLONOSCOPY WITH PROPOFOL;  Surgeon: Josefine Class, MD;  Location: [facility] ENDOSCOPY;  Service: Endoscopy;  Laterality: N/A;    Current Outpatient Rx  Name  Route  Sig  Dispense  Refill  . acetaminophen (TYLENOL) 325 MG tablet   Oral   Take 650 mg by mouth every 6 (six) hours as needed for mild pain or headache.          . docusate sodium (COLACE) 100 MG capsule   Oral   Take 100 mg by mouth at bedtime as needed for mild constipation.          . febuxostat (ULORIC) 40 MG tablet    Oral   Take 40 mg by mouth daily.         . ferrous sulfate (FERROUSUL) 325 (65 FE) MG tablet   Oral   Take 1 tablet (325 mg total) by mouth 2 (two) times daily with a meal.   60 tablet   0   . folic acid (FOLVITE) 1 MG tablet   Oral   Take 1 mg by mouth daily.         . furosemide (LASIX) 40 MG tablet   Oral   Take 40 mg by mouth every morning.          Marland Kitchen levothyroxine (SYNTHROID, LEVOTHROID) 50 MCG tablet   Oral   Take 50 mcg by mouth every morning.          . loperamide (IMODIUM A-D) 2 MG tablet   Oral   Take 2 mg by mouth 4 (four) times daily as needed for diarrhea or loose stools.         . magnesium oxide (MAG-OX) 400 MG tablet   Oral   Take 400 mg by mouth daily.         Marland Kitchen omeprazole (PRILOSEC) 20 MG capsule   Oral   Take 20 mg by mouth 2 (two) times daily.          Marland Kitchen oxybutynin (DITROPAN-XL) 5 MG 24 hr  tablet   Oral   Take 5 mg by mouth at bedtime.         . polyethylene glycol (MIRALAX / GLYCOLAX) packet   Oral   Take 17 g by mouth daily as needed for moderate constipation.          . potassium chloride (K-DUR,KLOR-CON) 10 MEQ tablet   Oral   Take 10 mEq by mouth daily.         . predniSONE (DELTASONE) 20 MG tablet   Oral   Take 3 tablets (60 mg total) by mouth daily with breakfast.   15 tablet   0   . thiamine 100 MG tablet   Oral   Take 100 mg by mouth daily.           Allergies Review of patient's allergies indicates no known allergies.  Family History  Problem Relation Age of Onset  . Hypertension Father     Social History Social History  Substance Use Topics  . Smoking status: Former Research scientist (life sciences)  . Smokeless tobacco: None  . Alcohol Use: 7.2 oz/week    6 Cans of beer, 6 Shots of liquor per week    Review of Systems  Constitutional: Negative for fever. Cardiovascular: Negative for chest pain. Respiratory: Negative for shortness of breath. Gastrointestinal: Negative for abdominal pain, vomiting and  diarrhea. Genitourinary: Negative for dysuria. Musculoskeletal: Negative for back pain. Skin: Negative for rash. Neurological: Negative for headaches, focal weakness or numbness.   10-point ROS otherwise negative.  ____________________________________________   PHYSICAL EXAM:  VITAL SIGNS: ED Triage Vitals  Enc Vitals Group     BP 03/22/15 1123 116/76 mmHg     Pulse Rate 03/22/15 1123 75     Resp 03/22/15 1123 20     Temp 03/22/15 1123 97.7 F (36.5 C)     Temp Source 03/22/15 1123 Oral     SpO2 03/22/15 1123 100 %     Weight 03/22/15 1123 133 lb (60.328 kg)     Height 03/22/15 1123 5\' 4"  (1.626 m)   Constitutional: Alert and oriented. Well appearing and in no distress. Eyes: Conjunctivae are normal. PERRL. Normal extraocular movements. ENT   Head: Normocephalic and atraumatic.   Nose: No congestion/rhinnorhea.   Mouth/Throat: Mucous membranes are moist.   Neck: No stridor. Hematological/Lymphatic/Immunilogical: No cervical lymphadenopathy. Cardiovascular: Normal rate, regular rhythm.  No murmurs, rubs, or gallops. Respiratory: Normal respiratory effort without tachypnea nor retractions. Breath sounds are clear and equal bilaterally. No wheezes/rales/rhonchi. Gastrointestinal: Soft and nontender. No distention. Rectal: GUIAC negative Genitourinary: Deferred Musculoskeletal: Normal range of motion in all extremities. No joint effusions.  No lower extremity tenderness nor edema. Neurologic:  Normal speech and language. No gross focal neurologic deficits are appreciated. Speech is normal.  Skin:  Skin is warm, dry and intact. No rash noted. Psychiatric: Mood and affect are normal. Speech and behavior are normal. Patient exhibits appropriate insight and judgment.  ____________________________________________    LABS (pertinent positives/negatives)  Labs Reviewed  CBC - Abnormal; Notable for the following:    RBC 2.78 (*)    Hemoglobin 8.6 (*)    HCT 26.2  (*)    RDW 15.6 (*)    Platelets 125 (*)    All other components within normal limits  BASIC METABOLIC PANEL - Abnormal; Notable for the following:    CO2 21 (*)    Creatinine, Ser 2.19 (*)    Calcium 8.4 (*)    GFR calc non Af Amer 20 (*)  GFR calc Af Amer 24 (*)    All other components within normal limits     ____________________________________________   EKG  None  ____________________________________________    RADIOLOGY  None  ____________________________________________   PROCEDURES  Procedure(s) performed: None  Critical Care performed: No  ____________________________________________   INITIAL IMPRESSION / ASSESSMENT AND PLAN / ED COURSE  Pertinent labs & imaging results that were available during my care of the patient were reviewed by me and considered in my medical decision making (see chart for details).  Patient presented to the emergency department with concerns for anemia. Blood work here shows hemoglobin of 8.6. Patient not tachycardic nor hypotensive. Hemoglobin appears roughly consistent with previous tests. Guaiac negative. At this point no signs of any active bleeding. Additionally patient is asymptomatic from the anemia. Do not feel patient requires a transfusion at this time. Will discharge patient home to follow up with primary care doctor.  ____________________________________________   FINAL CLINICAL IMPRESSION(S) / ED DIAGNOSES  Final diagnoses:  Anemia, unspecified anemia type     Nance Pear, MD 03/22/15 1517

## 2015-03-22 NOTE — ED Notes (Signed)
Pt reports sent by home health nurse due to low hemoglobin. Pt denies pain, denies blood in stool or vomiting. Pt does report to drinking regularly. States had vodka yesterday and regularly drinks beer. Hx of anemia and needing blood transfusion.

## 2015-03-22 NOTE — ED Notes (Signed)
Pt was seen at her home by [facility], had some labs drawn and HGB came back low so pt was sent over for possible blood transfusion. Pt is currently being treated for UTI>

## 2015-04-10 ENCOUNTER — Inpatient Hospital Stay: Payer: Medicare Other | Admitting: Internal Medicine

## 2015-04-10 ENCOUNTER — Ambulatory Visit: Payer: Medicare Other | Admitting: Internal Medicine

## 2015-04-20 ENCOUNTER — Ambulatory Visit: Payer: Medicare Other | Admitting: Internal Medicine

## 2015-04-26 ENCOUNTER — Inpatient Hospital Stay: Payer: Medicare Other

## 2015-04-26 ENCOUNTER — Encounter: Payer: Self-pay | Admitting: Internal Medicine

## 2015-04-26 ENCOUNTER — Inpatient Hospital Stay: Payer: Medicare Other | Attending: Internal Medicine | Admitting: Internal Medicine

## 2015-04-26 VITALS — BP 148/84 | HR 76 | Temp 97.3°F | Wt 125.4 lb

## 2015-04-26 DIAGNOSIS — N189 Chronic kidney disease, unspecified: Principal | ICD-10-CM

## 2015-04-26 DIAGNOSIS — D649 Anemia, unspecified: Secondary | ICD-10-CM | POA: Insufficient documentation

## 2015-04-26 DIAGNOSIS — Z8639 Personal history of other endocrine, nutritional and metabolic disease: Secondary | ICD-10-CM | POA: Insufficient documentation

## 2015-04-26 DIAGNOSIS — M129 Arthropathy, unspecified: Secondary | ICD-10-CM | POA: Diagnosis not present

## 2015-04-26 DIAGNOSIS — Z7952 Long term (current) use of systemic steroids: Secondary | ICD-10-CM | POA: Diagnosis not present

## 2015-04-26 DIAGNOSIS — F1010 Alcohol abuse, uncomplicated: Secondary | ICD-10-CM | POA: Insufficient documentation

## 2015-04-26 DIAGNOSIS — Z87891 Personal history of nicotine dependence: Secondary | ICD-10-CM | POA: Insufficient documentation

## 2015-04-26 DIAGNOSIS — Z79899 Other long term (current) drug therapy: Secondary | ICD-10-CM | POA: Insufficient documentation

## 2015-04-26 DIAGNOSIS — N183 Chronic kidney disease, stage 3 (moderate): Secondary | ICD-10-CM | POA: Diagnosis not present

## 2015-04-26 DIAGNOSIS — N179 Acute kidney failure, unspecified: Secondary | ICD-10-CM

## 2015-04-26 DIAGNOSIS — D631 Anemia in chronic kidney disease: Secondary | ICD-10-CM

## 2015-04-26 DIAGNOSIS — I129 Hypertensive chronic kidney disease with stage 1 through stage 4 chronic kidney disease, or unspecified chronic kidney disease: Secondary | ICD-10-CM | POA: Diagnosis not present

## 2015-04-26 LAB — COMPREHENSIVE METABOLIC PANEL
ALT: 15 U/L (ref 14–54)
AST: 39 U/L (ref 15–41)
Albumin: 3 g/dL — ABNORMAL LOW (ref 3.5–5.0)
Alkaline Phosphatase: 65 U/L (ref 38–126)
Anion gap: 5 (ref 5–15)
BUN: 15 mg/dL (ref 6–20)
CHLORIDE: 107 mmol/L (ref 101–111)
CO2: 22 mmol/L (ref 22–32)
Calcium: 7.4 mg/dL — ABNORMAL LOW (ref 8.9–10.3)
Creatinine, Ser: 2.03 mg/dL — ABNORMAL HIGH (ref 0.44–1.00)
GFR, EST AFRICAN AMERICAN: 26 mL/min — AB (ref >60)
GFR, EST NON AFRICAN AMERICAN: 22 mL/min — AB (ref >60)
Glucose, Bld: 93 mg/dL (ref 65–99)
POTASSIUM: 4.3 mmol/L (ref 3.5–5.1)
SODIUM: 134 mmol/L — AB (ref 135–145)
Total Bilirubin: 0.6 mg/dL (ref 0.3–1.2)
Total Protein: 6.9 g/dL (ref 6.5–8.1)

## 2015-04-26 LAB — CBC WITH DIFFERENTIAL/PLATELET
BASOS ABS: 0.1 10*3/uL (ref 0–0.1)
Basophils Relative: 2 %
EOS ABS: 0.3 10*3/uL (ref 0–0.7)
Eosinophils Relative: 6 %
HCT: 28.2 % — ABNORMAL LOW (ref 35.0–47.0)
Hemoglobin: 9 g/dL — ABNORMAL LOW (ref 12.0–16.0)
LYMPHS ABS: 1.4 10*3/uL (ref 1.0–3.6)
Lymphocytes Relative: 32 %
MCH: 29.6 pg (ref 26.0–34.0)
MCHC: 31.8 g/dL — ABNORMAL LOW (ref 32.0–36.0)
MCV: 92.9 fL (ref 80.0–100.0)
Monocytes Absolute: 0.3 10*3/uL (ref 0.2–0.9)
Monocytes Relative: 7 %
Neutro Abs: 2.5 10*3/uL (ref 1.4–6.5)
Neutrophils Relative %: 53 %
PLATELETS: 198 10*3/uL (ref 150–440)
RBC: 3.04 MIL/uL — AB (ref 3.80–5.20)
RDW: 15.4 % — ABNORMAL HIGH (ref 11.5–14.5)
WBC: 4.6 10*3/uL (ref 3.6–11.0)

## 2015-04-26 LAB — FERRITIN: FERRITIN: 1484 ng/mL — AB (ref 11–307)

## 2015-04-26 LAB — IRON AND TIBC
IRON: 111 ug/dL (ref 28–170)
SATURATION RATIOS: 93 % — AB (ref 10.4–31.8)
TIBC: 119 ug/dL — AB (ref 250–450)
UIBC: 8 ug/dL

## 2015-04-26 LAB — LACTATE DEHYDROGENASE: LDH: 106 U/L (ref 98–192)

## 2015-04-26 NOTE — Progress Notes (Signed)
[place] OFFICE PROGRESS NOTE  Patient Care Team: Marguerita Merles, MD as PCP - General (Family Medicine)   SUMMARY OF ONCOLOGIC HISTORY:  #  Anemia of chronic kidney disease; NOV 2016- Re-start Procrit q 2 w  # CKD [ Stage III ; creatinine~2.3]; alcohol abuse; Walks with cane/walker  INTERVAL HISTORY:  80 year old African-American female patient with history of chronic kidney disease and history of anemia; frail and also history of alcohol abuse is here for follow-up.  Patient had been in the clinic approximately 2 years ago when she received Procrit for low hemoglobin. She was then lost to follow-up.  Patient was recently reevaluated and noted to have hemoglobin around 8; she also needed to have blood transfusion.  She denies any blood in stools black stools. Denies any significant weight loss. Denies any swallowing.   REVIEW OF SYSTEMS:  A complete 10 point review of system is done which is negative except mentioned above/history of present illness.   PAST MEDICAL HISTORY :  Past Medical History  Diagnosis Date  . Arthritis   . Hypertension   . Anemia   . Gout   . ETOH abuse     PAST SURGICAL HISTORY :   Past Surgical History  Procedure Laterality Date  . Abdominal hysterectomy    . Esophagogastroduodenoscopy (egd) with propofol N/A 11/08/2014    Procedure: ESOPHAGOGASTRODUODENOSCOPY (EGD) WITH PROPOFOL;  Surgeon: Lollie Sails, MD;  Location: [facility] ENDOSCOPY;  Service: Endoscopy;  Laterality: N/A;  . Colonoscopy with propofol N/A 12/08/2014    Procedure: COLONOSCOPY WITH PROPOFOL;  Surgeon: Josefine Class, MD;  Location: [facility] ENDOSCOPY;  Service: Endoscopy;  Laterality: N/A;    FAMILY HISTORY :   Family History  Problem Relation Age of Onset  . Hypertension Father     SOCIAL HISTORY:   Social History  Substance Use Topics  . Smoking status: Former Research scientist (life sciences)  . Smokeless tobacco: None  . Alcohol Use: 7.2 oz/week    6 Cans of beer, 6 Shots of  liquor per week    ALLERGIES:  has No Known Allergies.  MEDICATIONS:  Current Outpatient Prescriptions  Medication Sig Dispense Refill  . acetaminophen (TYLENOL) 325 MG tablet Take 650 mg by mouth every 6 (six) hours as needed for mild pain or headache.     Marland Kitchen amLODipine (NORVASC) 5 MG tablet     . cefdinir (OMNICEF) 300 MG capsule     . docusate sodium (COLACE) 100 MG capsule Take 100 mg by mouth at bedtime as needed for mild constipation.     . febuxostat (ULORIC) 40 MG tablet Take 40 mg by mouth daily.    . ferrous sulfate (FERROUSUL) 325 (65 FE) MG tablet Take 1 tablet (325 mg total) by mouth 2 (two) times daily with a meal. 60 tablet 0  . folic acid (FOLVITE) 1 MG tablet Take 1 mg by mouth daily.    . furosemide (LASIX) 40 MG tablet Take 40 mg by mouth every morning.     Marland Kitchen levothyroxine (SYNTHROID, LEVOTHROID) 50 MCG tablet Take 50 mcg by mouth every morning.     . loperamide (IMODIUM A-D) 2 MG tablet Take 2 mg by mouth 4 (four) times daily as needed for diarrhea or loose stools.    . magnesium oxide (MAG-OX) 400 MG tablet Take 400 mg by mouth daily.    . metoprolol tartrate (LOPRESSOR) 25 MG tablet     . omeprazole (PRILOSEC) 20 MG capsule Take 20 mg by mouth 2 (  two) times daily.     Marland Kitchen oxybutynin (DITROPAN-XL) 5 MG 24 hr tablet Take 5 mg by mouth at bedtime.    . polyethylene glycol (MIRALAX / GLYCOLAX) packet Take 17 g by mouth daily as needed for moderate constipation.     . potassium chloride (K-DUR,KLOR-CON) 10 MEQ tablet Take 10 mEq by mouth daily.    . predniSONE (DELTASONE) 20 MG tablet Take 3 tablets (60 mg total) by mouth daily with breakfast. 15 tablet 0  . thiamine 100 MG tablet Take 100 mg by mouth daily.    . traZODone (DESYREL) 50 MG tablet      No current facility-administered medications for this visit.    PHYSICAL EXAMINATION: ECOG PERFORMANCE STATUS:   BP 148/84 mmHg  Pulse 76  Temp(Src) 97.3 F (36.3 C) (Tympanic)  Wt 125 lb 7.1 oz (56.9 kg)  Filed  Weights   04/26/15 1048  Weight: 125 lb 7.1 oz (56.9 kg)    GENERAL: Thin built female patient; Alert, no distress and comfortable.   Accompanied by her daughter. She is in a wheelchair. EYES: Positive for pallor. OROPHARYNX: Poor dentition. NECK: supple, no masses felt LYMPH:  no palpable lymphadenopathy in the cervical, axillary or inguinal regions LUNGS: clear to auscultation and  No wheeze or crackles HEART/CVS: regular rate & rhythm and no murmurs; No lower extremity edema ABDOMEN:abdomen soft, non-tender and normal bowel sounds Musculoskeletal:no cyanosis of digits and no clubbing  PSYCH: alert & oriented x 3 with fluent speech NEURO: no focal motor/sensory deficits SKIN:  no rashes or significant lesions  LABORATORY DATA:  I have reviewed the data as listed    Component Value Date/Time   NA 134* 04/26/2015 1130   NA 141 06/06/2014 1934   K 4.3 04/26/2015 1130   K 5.1 06/06/2014 1934   CL 107 04/26/2015 1130   CL 104 06/06/2014 1934   CO2 22 04/26/2015 1130   CO2 22 06/06/2014 1934   GLUCOSE 93 04/26/2015 1130   GLUCOSE 73 06/06/2014 1934   BUN 15 04/26/2015 1130   BUN 22* 06/06/2014 1934   CREATININE 2.03* 04/26/2015 1130   CREATININE 1.38* 06/06/2014 1934   CALCIUM 7.4* 04/26/2015 1130   CALCIUM 7.8* 06/06/2014 1934   PROT 6.9 04/26/2015 1130   PROT 6.9 06/06/2014 1934   ALBUMIN 3.0* 04/26/2015 1130   ALBUMIN 2.4* 06/06/2014 1934   AST 39 04/26/2015 1130   AST 53* 06/06/2014 1934   ALT 15 04/26/2015 1130   ALT 25 06/06/2014 1934   ALKPHOS 65 04/26/2015 1130   ALKPHOS 71 06/06/2014 1934   BILITOT 0.6 04/26/2015 1130   BILITOT 0.4 06/06/2014 1934   GFRNONAA 22* 04/26/2015 1130   GFRNONAA 39* 06/06/2014 1934   GFRNONAA 27* 12/20/2013 1147   GFRAA 26* 04/26/2015 1130   GFRAA 48* 06/06/2014 1934   GFRAA 31* 12/20/2013 1147    No results found for: SPEP, UPEP  Lab Results  Component Value Date   WBC 4.6 04/26/2015   NEUTROABS 2.5 04/26/2015   HGB 9.0*  04/26/2015   HCT 28.2* 04/26/2015   MCV 92.9 04/26/2015   PLT 198 04/26/2015      Chemistry      Component Value Date/Time   NA 134* 04/26/2015 1130   NA 141 06/06/2014 1934   K 4.3 04/26/2015 1130   K 5.1 06/06/2014 1934   CL 107 04/26/2015 1130   CL 104 06/06/2014 1934   CO2 22 04/26/2015 1130   CO2 22 06/06/2014 1934  BUN 15 04/26/2015 1130   BUN 22* 06/06/2014 1934   CREATININE 2.03* 04/26/2015 1130   CREATININE 1.38* 06/06/2014 1934      Component Value Date/Time   CALCIUM 7.4* 04/26/2015 1130   CALCIUM 7.8* 06/06/2014 1934   ALKPHOS 65 04/26/2015 1130   ALKPHOS 71 06/06/2014 1934   AST 39 04/26/2015 1130   AST 53* 06/06/2014 1934   ALT 15 04/26/2015 1130   ALT 25 06/06/2014 1934   BILITOT 0.6 04/26/2015 1130   BILITOT 0.4 06/06/2014 1934       RADIOGRAPHIC STUDIES: I have personally reviewed the radiological images as listed and agreed with the findings in the report. No results found.   ASSESSMENT & PLAN:   # Anemia of chronic kidney disease/symptomatic- patient's creatinine has been around 2.2. She had received Procrit approximately 2 years ago; seems to have responded at the time.  # More recently her hemoglobin was in 8; needing blood transfusion recently. I recommend restarting Procrit 40,000 units every 2 weeks [the daughter cannot bring the patient more frequently].   # I also checked CBC/CMP LDH/iron studies and ferritin today. Patient will get every 2 weeks hemoglobin; follow-up with me in the next 6-8 weeks.  # Chronic kidney disease- recommend follow-up with nephrology.  # Alcohol abuse- patient not interested in discussing alcohol cessation.  Orders Placed This Encounter  Procedures  . CBC with Differential/Platelet    Standing Status: Future     Number of Occurrences: 1     Standing Expiration Date: 04/25/2016  . Comprehensive metabolic panel    Standing Status: Future     Number of Occurrences: 1     Standing Expiration Date:  04/25/2016  . Lactate dehydrogenase    Standing Status: Future     Number of Occurrences: 1     Standing Expiration Date: 04/25/2016  . Ferritin    Standing Status: Future     Number of Occurrences: 1     Standing Expiration Date: 04/25/2016  . Iron and TIBC    Standing Status: Future     Number of Occurrences: 1     Standing Expiration Date: 04/25/2016  . Hemoglobin    Standing Status: Standing     Number of Occurrences: 3     Standing Expiration Date: 04/25/2016  . CBC with Differential/Platelet    Standing Status: Future     Number of Occurrences:      Standing Expiration Date: 04/25/2016  . Comprehensive metabolic panel    Standing Status: Future     Number of Occurrences:      Standing Expiration Date: 04/25/2016   All questions were answered. The patient knows to call the clinic with any problems, questions or concerns. No barriers to learning was detected.  I spent 25 minutes counseling the patient face to face. The total time spent in the appointment was 30 minutes and more than 50% was on counseling and review of test results     Cammie Sickle, MD 04/26/2015 12:44 PM

## 2015-05-03 ENCOUNTER — Inpatient Hospital Stay: Payer: Medicare Other

## 2015-05-03 ENCOUNTER — Inpatient Hospital Stay: Payer: Medicare Other | Attending: Internal Medicine

## 2015-05-03 VITALS — BP 146/88 | HR 70 | Temp 97.0°F | Resp 18

## 2015-05-03 DIAGNOSIS — N183 Chronic kidney disease, stage 3 (moderate): Secondary | ICD-10-CM | POA: Diagnosis not present

## 2015-05-03 DIAGNOSIS — Z79899 Other long term (current) drug therapy: Secondary | ICD-10-CM | POA: Diagnosis not present

## 2015-05-03 DIAGNOSIS — D631 Anemia in chronic kidney disease: Secondary | ICD-10-CM

## 2015-05-03 DIAGNOSIS — N179 Acute kidney failure, unspecified: Secondary | ICD-10-CM

## 2015-05-03 DIAGNOSIS — N189 Chronic kidney disease, unspecified: Secondary | ICD-10-CM

## 2015-05-03 LAB — HEMOGLOBIN: Hemoglobin: 8.3 g/dL — ABNORMAL LOW (ref 12.0–16.0)

## 2015-05-03 MED ORDER — EPOETIN ALFA 40000 UNIT/ML IJ SOLN
40000.0000 [IU] | Freq: Once | INTRAMUSCULAR | Status: AC
Start: 2015-05-03 — End: 2015-05-03
  Administered 2015-05-03: 40000 [IU] via SUBCUTANEOUS
  Filled 2015-05-03: qty 1

## 2015-05-17 ENCOUNTER — Inpatient Hospital Stay: Payer: Medicare Other

## 2015-05-17 VITALS — BP 132/90 | HR 83 | Temp 97.0°F | Resp 18

## 2015-05-17 DIAGNOSIS — N189 Chronic kidney disease, unspecified: Secondary | ICD-10-CM

## 2015-05-17 DIAGNOSIS — N183 Chronic kidney disease, stage 3 (moderate): Principal | ICD-10-CM

## 2015-05-17 DIAGNOSIS — N179 Acute kidney failure, unspecified: Secondary | ICD-10-CM

## 2015-05-17 DIAGNOSIS — D631 Anemia in chronic kidney disease: Secondary | ICD-10-CM | POA: Diagnosis not present

## 2015-05-17 LAB — HEMOGLOBIN: HEMOGLOBIN: 9 g/dL — AB (ref 12.0–16.0)

## 2015-05-17 MED ORDER — EPOETIN ALFA 40000 UNIT/ML IJ SOLN
40000.0000 [IU] | Freq: Once | INTRAMUSCULAR | Status: AC
  Administered 2015-05-17: 40000 [IU] via SUBCUTANEOUS
  Filled 2015-05-17: qty 1

## 2015-05-31 ENCOUNTER — Inpatient Hospital Stay: Payer: Medicare Other

## 2015-06-20 ENCOUNTER — Encounter: Payer: Self-pay | Admitting: *Deleted

## 2015-06-21 ENCOUNTER — Inpatient Hospital Stay: Payer: Medicare Other | Admitting: Internal Medicine

## 2015-06-21 ENCOUNTER — Inpatient Hospital Stay: Payer: Medicare Other

## 2015-06-28 ENCOUNTER — Inpatient Hospital Stay: Payer: Medicare Other | Attending: Internal Medicine

## 2015-06-28 ENCOUNTER — Inpatient Hospital Stay: Payer: Medicare Other

## 2015-08-24 ENCOUNTER — Inpatient Hospital Stay
Admission: EM | Admit: 2015-08-24 | Discharge: 2015-08-28 | DRG: 378 | Disposition: A | Discharge status: Home / Self Care | Payer: Medicare Other | Attending: Internal Medicine | Admitting: Internal Medicine

## 2015-08-24 ENCOUNTER — Encounter: Payer: Self-pay | Admitting: Emergency Medicine

## 2015-08-24 DIAGNOSIS — D631 Anemia in chronic kidney disease: Secondary | ICD-10-CM | POA: Diagnosis present

## 2015-08-24 DIAGNOSIS — E039 Hypothyroidism, unspecified: Secondary | ICD-10-CM | POA: Diagnosis present

## 2015-08-24 DIAGNOSIS — Z8249 Family history of ischemic heart disease and other diseases of the circulatory system: Secondary | ICD-10-CM | POA: Diagnosis not present

## 2015-08-24 DIAGNOSIS — Z87891 Personal history of nicotine dependence: Secondary | ICD-10-CM

## 2015-08-24 DIAGNOSIS — N183 Chronic kidney disease, stage 3 (moderate): Secondary | ICD-10-CM | POA: Diagnosis present

## 2015-08-24 DIAGNOSIS — I129 Hypertensive chronic kidney disease with stage 1 through stage 4 chronic kidney disease, or unspecified chronic kidney disease: Secondary | ICD-10-CM | POA: Diagnosis present

## 2015-08-24 DIAGNOSIS — M1990 Unspecified osteoarthritis, unspecified site: Secondary | ICD-10-CM | POA: Diagnosis present

## 2015-08-24 DIAGNOSIS — E861 Hypovolemia: Secondary | ICD-10-CM | POA: Diagnosis present

## 2015-08-24 DIAGNOSIS — Z9889 Other specified postprocedural states: Secondary | ICD-10-CM | POA: Diagnosis not present

## 2015-08-24 DIAGNOSIS — K922 Gastrointestinal hemorrhage, unspecified: Secondary | ICD-10-CM

## 2015-08-24 DIAGNOSIS — Z79899 Other long term (current) drug therapy: Secondary | ICD-10-CM

## 2015-08-24 DIAGNOSIS — N390 Urinary tract infection, site not specified: Secondary | ICD-10-CM | POA: Diagnosis present

## 2015-08-24 DIAGNOSIS — N179 Acute kidney failure, unspecified: Secondary | ICD-10-CM | POA: Diagnosis present

## 2015-08-24 DIAGNOSIS — D649 Anemia, unspecified: Secondary | ICD-10-CM

## 2015-08-24 DIAGNOSIS — Z90710 Acquired absence of both cervix and uterus: Secondary | ICD-10-CM

## 2015-08-24 DIAGNOSIS — K921 Melena: Principal | ICD-10-CM | POA: Diagnosis present

## 2015-08-24 DIAGNOSIS — M1A9XX0 Chronic gout, unspecified, without tophus (tophi): Secondary | ICD-10-CM | POA: Diagnosis present

## 2015-08-24 DIAGNOSIS — F1010 Alcohol abuse, uncomplicated: Secondary | ICD-10-CM | POA: Diagnosis present

## 2015-08-24 LAB — CBC
HEMATOCRIT: 21.2 % — AB (ref 35.0–47.0)
Hemoglobin: 6.7 g/dL — ABNORMAL LOW (ref 12.0–16.0)
MCH: 30.3 pg (ref 26.0–34.0)
MCHC: 31.8 g/dL — ABNORMAL LOW (ref 32.0–36.0)
MCV: 95.4 fL (ref 80.0–100.0)
PLATELETS: 194 10*3/uL (ref 150–440)
RBC: 2.22 MIL/uL — AB (ref 3.80–5.20)
RDW: 17.9 % — ABNORMAL HIGH (ref 11.5–14.5)
WBC: 5 10*3/uL (ref 3.6–11.0)

## 2015-08-24 LAB — COMPREHENSIVE METABOLIC PANEL
ALT: 26 U/L (ref 14–54)
ANION GAP: 7 (ref 5–15)
AST: 64 U/L — AB (ref 15–41)
Albumin: 2.7 g/dL — ABNORMAL LOW (ref 3.5–5.0)
Alkaline Phosphatase: 105 U/L (ref 38–126)
BILIRUBIN TOTAL: 0.2 mg/dL — AB (ref 0.3–1.2)
BUN: 33 mg/dL — AB (ref 6–20)
CHLORIDE: 109 mmol/L (ref 101–111)
CO2: 20 mmol/L — ABNORMAL LOW (ref 22–32)
Calcium: 8.4 mg/dL — ABNORMAL LOW (ref 8.9–10.3)
Creatinine, Ser: 2.94 mg/dL — ABNORMAL HIGH (ref 0.44–1.00)
GFR, EST AFRICAN AMERICAN: 17 mL/min — AB (ref >60)
GFR, EST NON AFRICAN AMERICAN: 14 mL/min — AB (ref >60)
Glucose, Bld: 125 mg/dL — ABNORMAL HIGH (ref 65–99)
POTASSIUM: 3.9 mmol/L (ref 3.5–5.1)
Sodium: 136 mmol/L (ref 135–145)
Total Protein: 6.8 g/dL (ref 6.5–8.1)

## 2015-08-24 LAB — LIPASE, BLOOD: Lipase: 71 U/L — ABNORMAL HIGH (ref 11–51)

## 2015-08-24 LAB — PREPARE RBC (CROSSMATCH)

## 2015-08-24 MED ORDER — PANTOPRAZOLE SODIUM 40 MG IV SOLR
40.0000 mg | Freq: Once | INTRAVENOUS | Status: AC
  Administered 2015-08-24: 40 mg via INTRAVENOUS
  Filled 2015-08-24: qty 40

## 2015-08-24 MED ORDER — SODIUM CHLORIDE 0.9 % IV BOLUS (SEPSIS)
500.0000 mL | Freq: Once | INTRAVENOUS | Status: AC
  Administered 2015-08-25: 500 mL via INTRAVENOUS

## 2015-08-24 MED ORDER — POLYETHYLENE GLYCOL 3350 17 G PO PACK: 17.0000 g | PACK | Freq: Every day | ORAL | Status: DC | PRN

## 2015-08-24 MED ORDER — SODIUM CHLORIDE 0.9% FLUSH
3.0000 mL | Freq: Two times a day (BID) | INTRAVENOUS | Status: DC
  Administered 2015-08-25 – 2015-08-28 (×8): 3 mL via INTRAVENOUS

## 2015-08-24 MED ORDER — PANTOPRAZOLE SODIUM 40 MG IV SOLR
40.0000 mg | Freq: Two times a day (BID) | INTRAVENOUS | Status: DC
  Administered 2015-08-25 – 2015-08-28 (×7): 40 mg via INTRAVENOUS
  Filled 2015-08-24 (×7): qty 40

## 2015-08-24 MED ORDER — ACETAMINOPHEN 325 MG PO TABS: 650.0000 mg | ORAL_TABLET | Freq: Four times a day (QID) | ORAL | Status: DC | PRN

## 2015-08-24 MED ORDER — ONDANSETRON HCL 4 MG PO TABS: 4.0000 mg | ORAL_TABLET | Freq: Four times a day (QID) | ORAL | Status: DC | PRN

## 2015-08-24 MED ORDER — ACETAMINOPHEN 650 MG RE SUPP: 650.0000 mg | Freq: Four times a day (QID) | RECTAL | Status: DC | PRN

## 2015-08-24 MED ORDER — ONDANSETRON HCL 4 MG/2ML IJ SOLN: 4.0000 mg | Freq: Four times a day (QID) | INTRAMUSCULAR | Status: DC | PRN

## 2015-08-24 MED ORDER — OXYCODONE HCL 5 MG PO TABS: 5.0000 mg | ORAL_TABLET | ORAL | Status: DC | PRN

## 2015-08-24 MED ORDER — SODIUM CHLORIDE 0.9 % IV SOLN
10.0000 mL/h | Freq: Once | INTRAVENOUS | Status: AC
  Administered 2015-08-24: 10 mL/h via INTRAVENOUS

## 2015-08-24 NOTE — H&P (Signed)
[facility] [place] NAME: [name] Holmes    MR#:  [id number]  DATE OF BIRTH:  30-May-1935  DATE OF ADMISSION:  08/24/2015  PRIMARY CARE PHYSICIAN: Marguerita Merles, MD   REQUESTING/REFERRING PHYSICIAN: Dr. Archie Balboa  CHIEF COMPLAINT:   Chief Complaint  Patient presents with  . Diarrhea    states loose stool when she lays down x 2 weeks  . Dysuria    x 2 days    HISTORY OF PRESENT ILLNESS:  Nimah Ill  is a 81 y.o. female with a known history of chronic anemia, hypertension, CKD, past alcohol abuse presents to the emergency room complaining of dizziness, weakness and 3 days of melena. Patient's stool is positive for blood in the emergency room and hemoglobin is 6.7. Baseline hemoglobin seems to be around 10. She also has some acute renal failure. Patient had EGD in May 2016 which was normal. Also had a recent colonoscopy with no acute bleeding found. Complains of mild epigastric pain, nausea but no vomiting. Takes iron supplements at home. On PPI. No blood thinners.  She also complains of some dysuria for 2 weeks. Afebrile. No hematuria.  PAST MEDICAL HISTORY:   Past Medical History  Diagnosis Date  . Arthritis   . Hypertension   . Anemia of chronic renal failure   . Gout   . ETOH abuse   . CKD (chronic kidney disease)     PAST SURGICAL HISTORY:   Past Surgical History  Procedure Laterality Date  . Abdominal hysterectomy    . Esophagogastroduodenoscopy (egd) with propofol N/A 11/08/2014    Procedure: ESOPHAGOGASTRODUODENOSCOPY (EGD) WITH PROPOFOL;  Surgeon: Lollie Sails, MD;  Location: [facility] ENDOSCOPY;  Service: Endoscopy;  Laterality: N/A;  . Colonoscopy with propofol N/A 12/08/2014    Procedure: COLONOSCOPY WITH PROPOFOL;  Surgeon: Josefine Class, MD;  Location: [facility] ENDOSCOPY;  Service: Endoscopy;  Laterality: N/A;    SOCIAL HISTORY:   Social History  Substance Use Topics  . Smoking status: Former Smoker     Types: Cigarettes  . Smokeless tobacco: Never Used  . Alcohol Use: 7.2 oz/week    6 Cans of beer, 6 Shots of liquor per week     Comment: daily    FAMILY HISTORY:   Family History  Problem Relation Age of Onset  . Hypertension Father     DRUG ALLERGIES:  No Known Allergies  REVIEW OF SYSTEMS:   Review of Systems  Constitutional: Positive for malaise/fatigue. Negative for fever, chills and weight loss.  HENT: Negative for hearing loss and nosebleeds.   Eyes: Negative for blurred vision, double vision and pain.  Respiratory: Negative for cough, hemoptysis, sputum production, shortness of breath and wheezing.   Cardiovascular: Negative for chest pain, palpitations, orthopnea and leg swelling.  Gastrointestinal: Positive for nausea, abdominal pain and melena. Negative for vomiting, diarrhea and constipation.  Genitourinary: Negative for dysuria and hematuria.  Musculoskeletal: Positive for back pain and joint pain. Negative for myalgias and falls.  Skin: Negative for rash.  Neurological: Positive for dizziness and weakness. Negative for tremors, sensory change, speech change, focal weakness, seizures and headaches.  Endo/Heme/Allergies: Does not bruise/bleed easily.  Psychiatric/Behavioral: Negative for depression and memory loss. The patient is not nervous/anxious.     MEDICATIONS AT HOME:   Prior to Admission medications   Medication Sig Start Date End Date Taking? Authorizing Provider  acetaminophen (TYLENOL) 325 MG tablet Take 650 mg by mouth every 6 (six) hours as needed  for mild pain or headache.    Yes Historical Provider, MD  amLODipine (NORVASC) 5 MG tablet Take 5 mg by mouth daily.    Yes Historical Provider, MD  cholecalciferol (VITAMIN D) 1000 units tablet Take 1,000 Units by mouth daily.   Yes Historical Provider, MD  docusate sodium (COLACE) 100 MG capsule Take 100 mg by mouth at bedtime as needed for mild constipation.    Yes Historical Provider, MD  ferrous  sulfate (FERROUSUL) 325 (65 FE) MG tablet Take 1 tablet (325 mg total) by mouth 2 (two) times daily with a meal. 12/08/14  Yes Dustin Flock, MD  folic acid (FOLVITE) 1 MG tablet Take 1 mg by mouth daily.   Yes Historical Provider, MD  levothyroxine (SYNTHROID, LEVOTHROID) 50 MCG tablet Take 50 mcg by mouth every morning.    Yes Historical Provider, MD  magnesium oxide (MAG-OX) 400 MG tablet Take 400 mg by mouth daily.   Yes Historical Provider, MD  metoprolol tartrate (LOPRESSOR) 25 MG tablet Take 25 mg by mouth 2 (two) times daily.    Yes Historical Provider, MD  Multiple Vitamin (MULTIVITAMIN WITH MINERALS) TABS tablet Take 1 tablet by mouth daily.   Yes Historical Provider, MD  omeprazole (PRILOSEC) 20 MG capsule Take 20 mg by mouth 2 (two) times daily.    Yes Historical Provider, MD  oxybutynin (DITROPAN-XL) 5 MG 24 hr tablet Take 5 mg by mouth at bedtime.   Yes Historical Provider, MD  polyethylene glycol (MIRALAX / GLYCOLAX) packet Take 17 g by mouth daily as needed for moderate constipation.    Yes Historical Provider, MD  potassium chloride (K-DUR,KLOR-CON) 10 MEQ tablet Take 10 mEq by mouth daily.   Yes Historical Provider, MD  thiamine 100 MG tablet Take 100 mg by mouth daily.   Yes Historical Provider, MD  traZODone (DESYREL) 50 MG tablet Take 50 mg by mouth at bedtime.    Yes Historical Provider, MD  febuxostat (ULORIC) 40 MG tablet Take 40 mg by mouth daily.    Historical Provider, MD  furosemide (LASIX) 40 MG tablet Take 40 mg by mouth every morning.     Historical Provider, MD  loperamide (IMODIUM A-D) 2 MG tablet Take 2 mg by mouth 4 (four) times daily as needed for diarrhea or loose stools.    Historical Provider, MD     VITAL SIGNS:  Blood pressure 116/71, pulse 70, temperature 98.9 F (37.2 C), temperature source Oral, resp. rate 20, height 5\' 4"  (1.626 m), weight 61.236 kg (135 lb), SpO2 100 %.  PHYSICAL EXAMINATION:  Physical Exam  GENERAL:  81 y.o.-year-old patient lying  in the bed with no acute distress.  EYES: Pupils equal, round, reactive to light and accommodation. No scleral icterus. Extraocular muscles intact. Pale  HEENT: Head atraumatic, normocephalic. Oropharynx and nasopharynx clear. No oropharyngeal erythema, moist oral mucosa  NECK:  Supple, no jugular venous distention. No thyroid enlargement, no tenderness.  LUNGS: Normal breath sounds bilaterally, no wheezing, rales, rhonchi. No use of accessory muscles of respiration.  CARDIOVASCULAR: S1, S2 normal. No murmurs, rubs, or gallops.  ABDOMEN: Soft, mild epigastric tenderness. Bowel sounds present. No organomegaly or mass.  EXTREMITIES: No pedal edema, cyanosis, or clubbing. + 2 pedal & radial pulses b/l.   NEUROLOGIC: Cranial nerves II through XII are intact. No focal Motor or sensory deficits appreciated b/l PSYCHIATRIC: The patient is alert and oriented x 3. Good affect.  SKIN: No obvious rash, lesion, or ulcer.   LABORATORY PANEL:  CBC  Recent Labs Lab 08/24/15 1811  WBC 5.0  HGB 6.7*  HCT 21.2*  PLT 194   ------------------------------------------------------------------------------------------------------------------  Chemistries   Recent Labs Lab 08/24/15 1811  NA 136  K 3.9  CL 109  CO2 20*  GLUCOSE 125*  BUN 33*  CREATININE 2.94*  CALCIUM 8.4*  AST 64*  ALT 26  ALKPHOS 105  BILITOT 0.2*   ------------------------------------------------------------------------------------------------------------------  Cardiac Enzymes No results for input(s): TROPONINI in the last 168 hours. ------------------------------------------------------------------------------------------------------------------  RADIOLOGY:  No results found.   IMPRESSION AND PLAN:   * Upper GI bleed with melena and acute blood loss anemia Clear liquids Protonix IV twice a day Consult GI for EGD Transfuse 2 units packed RBC and serial hemoglobin checks. Last EGD was in May 2016 and was  normal.  * Hypertension Hold Norvasc. Continue metoprolol from tomorrow.  * Acute renal failure over CKD likely from GI bleed and hypovolemia. Patient will get 2 units packed RBC. Repeat labs in the morning.   * Dysuria. Check UA  * DVT prophylaxis with SCDs   All the records are reviewed and case discussed with ED provider. Management plans discussed with the patient, family and they are in agreement.  CODE STATUS: FULL  TOTAL TIME TAKING CARE OF THIS PATIENT: 40 minutes.   Hillary Bow R M.D on 08/24/2015 at 11:20 PM  Between 7am to 6pm - Pager - [phone]  After 6pm go to [email or web address] - password [email or web address] [facility]  Office  [phone]  CC: Primary care physician; Marguerita Merles, MD  Note: This dictation was prepared with Dragon dictation along with smaller phrase technology. Any transcriptional errors that result from this process are unintentional.

## 2015-08-24 NOTE — ED Notes (Signed)
States dark stools x 2 weeks, and pain with urination x 2 days

## 2015-08-25 LAB — URINALYSIS COMPLETE WITH MICROSCOPIC (ARMC ONLY)
Bilirubin Urine: NEGATIVE
Glucose, UA: NEGATIVE mg/dL
KETONES UR: NEGATIVE mg/dL
Nitrite: NEGATIVE
PROTEIN: NEGATIVE mg/dL
Specific Gravity, Urine: 1.004 — ABNORMAL LOW (ref 1.005–1.030)
pH: 5 (ref 5.0–8.0)

## 2015-08-25 LAB — BASIC METABOLIC PANEL
ANION GAP: 4 — AB (ref 5–15)
BUN: 35 mg/dL — ABNORMAL HIGH (ref 6–20)
CO2: 22 mmol/L (ref 22–32)
Calcium: 7.9 mg/dL — ABNORMAL LOW (ref 8.9–10.3)
Chloride: 111 mmol/L (ref 101–111)
Creatinine, Ser: 2.89 mg/dL — ABNORMAL HIGH (ref 0.44–1.00)
GFR, EST AFRICAN AMERICAN: 17 mL/min — AB (ref >60)
GFR, EST NON AFRICAN AMERICAN: 15 mL/min — AB (ref >60)
Glucose, Bld: 79 mg/dL (ref 65–99)
POTASSIUM: 4 mmol/L (ref 3.5–5.1)
SODIUM: 137 mmol/L (ref 135–145)

## 2015-08-25 LAB — CBC
HEMATOCRIT: 29.3 % — AB (ref 35.0–47.0)
HEMOGLOBIN: 9.7 g/dL — AB (ref 12.0–16.0)
MCH: 30.7 pg (ref 26.0–34.0)
MCHC: 33.2 g/dL (ref 32.0–36.0)
MCV: 92.5 fL (ref 80.0–100.0)
Platelets: 141 10*3/uL — ABNORMAL LOW (ref 150–440)
RBC: 3.17 MIL/uL — AB (ref 3.80–5.20)
RDW: 15.9 % — ABNORMAL HIGH (ref 11.5–14.5)
WBC: 5.1 10*3/uL (ref 3.6–11.0)

## 2015-08-25 LAB — C DIFFICILE QUICK SCREEN W PCR REFLEX
C DIFFICILE (CDIFF) INTERP: NEGATIVE
C Diff antigen: NEGATIVE
C Diff toxin: NEGATIVE

## 2015-08-25 LAB — HEMOGLOBIN: Hemoglobin: 9.8 g/dL — ABNORMAL LOW (ref 12.0–16.0)

## 2015-08-25 MED ORDER — FEBUXOSTAT 40 MG PO TABS
40.0000 mg | ORAL_TABLET | Freq: Every day | ORAL | Status: DC
  Administered 2015-08-25 – 2015-08-28 (×4): 40 mg via ORAL
  Filled 2015-08-25 (×4): qty 1

## 2015-08-25 MED ORDER — TRAZODONE HCL 50 MG PO TABS
50.0000 mg | ORAL_TABLET | Freq: Every day | ORAL | Status: DC
  Administered 2015-08-25 – 2015-08-27 (×3): 50 mg via ORAL
  Filled 2015-08-25 (×3): qty 1

## 2015-08-25 MED ORDER — FOLIC ACID 1 MG PO TABS
1.0000 mg | ORAL_TABLET | Freq: Every day | ORAL | Status: DC
  Administered 2015-08-25 – 2015-08-28 (×4): 1 mg via ORAL
  Filled 2015-08-25 (×4): qty 1

## 2015-08-25 MED ORDER — SIMETHICONE 40 MG/0.6ML PO SUSP
40.0000 mg | Freq: Four times a day (QID) | ORAL | Status: AC
  Administered 2015-08-25 – 2015-08-26 (×4): 40 mg via ORAL
  Filled 2015-08-25 (×4): qty 0.6

## 2015-08-25 MED ORDER — OXYBUTYNIN CHLORIDE ER 5 MG PO TB24
5.0000 mg | ORAL_TABLET | Freq: Every day | ORAL | Status: DC
  Administered 2015-08-25 – 2015-08-27 (×3): 5 mg via ORAL
  Filled 2015-08-25 (×3): qty 1

## 2015-08-25 MED ORDER — MAGNESIUM OXIDE 400 (241.3 MG) MG PO TABS
400.0000 mg | ORAL_TABLET | Freq: Every day | ORAL | Status: DC
  Administered 2015-08-25 – 2015-08-28 (×4): 400 mg via ORAL
  Filled 2015-08-25 (×4): qty 1

## 2015-08-25 MED ORDER — POLYETHYLENE GLYCOL 3350 17 GM/SCOOP PO POWD
1.0000 | Freq: Once | ORAL | Status: AC
  Administered 2015-08-26: 127.5 g via ORAL
  Filled 2015-08-25: qty 255

## 2015-08-25 MED ORDER — VITAMIN B-1 100 MG PO TABS
100.0000 mg | ORAL_TABLET | Freq: Every day | ORAL | Status: DC
  Administered 2015-08-25 – 2015-08-28 (×4): 100 mg via ORAL
  Filled 2015-08-25 (×4): qty 1

## 2015-08-25 MED ORDER — METOPROLOL TARTRATE 25 MG PO TABS
25.0000 mg | ORAL_TABLET | Freq: Two times a day (BID) | ORAL | Status: DC
  Administered 2015-08-25 – 2015-08-27 (×2): 25 mg via ORAL
  Filled 2015-08-25 (×6): qty 1

## 2015-08-25 MED ORDER — FUROSEMIDE 40 MG PO TABS
40.0000 mg | ORAL_TABLET | Freq: Every morning | ORAL | Status: DC
  Administered 2015-08-25: 40 mg via ORAL
  Filled 2015-08-25: qty 1

## 2015-08-25 MED ORDER — LOPERAMIDE HCL 2 MG PO CAPS: 2.0000 mg | ORAL_CAPSULE | Freq: Four times a day (QID) | ORAL | Status: DC | PRN

## 2015-08-25 MED ORDER — DEXTROSE 5 % IV SOLN
1.0000 g | INTRAVENOUS | Status: DC
  Administered 2015-08-25 – 2015-08-27 (×3): 1 g via INTRAVENOUS
  Filled 2015-08-25 (×4): qty 10

## 2015-08-25 MED ORDER — LEVOTHYROXINE SODIUM 50 MCG PO TABS
50.0000 ug | ORAL_TABLET | Freq: Every morning | ORAL | Status: DC
  Administered 2015-08-25 – 2015-08-28 (×4): 50 ug via ORAL
  Filled 2015-08-25 (×4): qty 1

## 2015-08-25 NOTE — Progress Notes (Signed)
Initial Nutrition Assessment  DOCUMENTATION CODES:   Non-severe (moderate) malnutrition in context of chronic illness  INTERVENTION:   Coordination of Care: await diet progression as medically able Medical Food Supplement Therapy: recommend Ensure Enlive po BID, each supplement provides 350 kcal and 20 grams of protein when diet able to be advanced   NUTRITION DIAGNOSIS:   Malnutrition related to chronic illness as evidenced by moderate depletions of muscle mass, moderate depletion of body fat.  GOAL:   Patient will meet greater than or equal to 90% of their needs  MONITOR:    (Energy Intake, Electrolyte and renal Profile, anthropmetrics, Digestive system)  REASON FOR ASSESSMENT:   Malnutrition Screening Tool    ASSESSMENT:   Pt admitted with upper GI bleed, s/p 2 units pRBCs.   Past Medical History  Diagnosis Date  . Arthritis   . Hypertension   . Anemia of chronic renal failure   . Gout   . ETOH abuse   . CKD (chronic kidney disease)    Diet Order:  Diet clear liquid Room service appropriate?: Yes; Fluid consistency:: Thin    Current Nutrition: Pt sipping on chicken broth and orange jello on visit.   Food/Nutrition-Related History: Pt reports eating 3 meals per day. Pt reports an aide come to her home and helps her with her meals. Pt reports she has 'Meals on Wheels' delivered to her home and she is able to reheat the leftovers in the moicrowave as well. Pt reports liking and drinking Vanilla Boost.   Scheduled Medications:  . febuxostat  40 mg Oral Daily  . folic acid  1 mg Oral Daily  . levothyroxine  50 mcg Oral q morning - 10a  . magnesium oxide  400 mg Oral Daily  . metoprolol tartrate  25 mg Oral BID  . oxybutynin  5 mg Oral QHS  . pantoprazole (PROTONIX) IV  40 mg Intravenous Q12H  . sodium chloride flush  3 mL Intravenous Q12H  . thiamine  100 mg Oral Daily  . traZODone  50 mg Oral QHS     Electrolyte/Renal Profile and Glucose Profile:    Recent Labs Lab 08/24/15 1811 08/25/15 0553  NA 136 137  K 3.9 4.0  CL 109 111  CO2 20* 22  BUN 33* 35*  CREATININE 2.94* 2.89*  CALCIUM 8.4* 7.9*  GLUCOSE 125* 79   Protein Profile:  Recent Labs Lab 08/24/15 1811  ALBUMIN 2.7*   Nutritional Anemia Profile:  CBC Latest Ref Rng 08/25/2015 08/25/2015 08/24/2015  WBC 3.6 - 11.0 K/uL - 5.1 5.0  Hemoglobin 12.0 - 16.0 g/dL 9.8(L) 9.7(L) 6.7(L)  Hematocrit 35.0 - 47.0 % - 29.3(L) 21.2(L)  Platelets 150 - 440 K/uL - 141(L) 194    Gastrointestinal Profile: Last BM:  08/24/2015   Nutrition-Focused Physical Exam Findings: Nutrition-Focused physical exam completed. Findings are mild-moderate fat depletion, mild-moderate-severe muscle depletion, and no edema.     Weight Change: Pt reports weight of 140lbs 'a while back.' Per chart review RD assessed pt with weight of 114lbs on 11/03/2014. RD notes a weight of 125lbs 04/26/2015, 6% weight loss in 4 months.    Height:   Ht Readings from Last 1 Encounters:  08/24/15 5\' 4"  (1.626 m)    Weight:   Wt Readings from Last 1 Encounters:  08/25/15 118 lb 6.4 oz (53.706 kg)   Wt Readings from Last 10 Encounters:  08/25/15 118 lb 6.4 oz (53.706 kg)  04/26/15 125 lb 7.1 oz (56.9 kg)  03/22/15 133  lb (60.328 kg)  12/15/14 135 lb (61.236 kg)  12/05/14 125 lb (56.7 kg)  12/03/14 140 lb (63.504 kg)  11/13/14 150 lb (68.04 kg)  11/03/14 114 lb 7 oz (51.909 kg)    BMI:  Body mass index is 20.31 kg/(m^2).  Estimated Nutritional Needs:   Kcal:  BEE: 1005kcals, TEE: (IF 1.1-1.3)(AF 1.2) 1327-1568kcals  Protein:  54-65g protein (1.0-1.2g/kg)  Fluid:  1350-1645mL of fluid (25-86mL/kg)  EDUCATION NEEDS:   No education needs identified at this time   Jennings, RD, LDN Pager [phone] Weekend/On-Call Pager [phone]

## 2015-08-25 NOTE — Progress Notes (Signed)
Patient admitted to room 117 with GIB, 1st unit pRBC's infusing on admit. Patient tolerated well. 2nd unit pRBC's infused per order with no complications noted and transfusion reaction noted. VSS. Patient voiding incontinent, U/A ordered, patient and staff are aware that urine sample is needed. NS IVF bolus infused after pRBC's infused. Pt tolerated well. Day RN is aware that urine sample is needed.

## 2015-08-25 NOTE — Progress Notes (Signed)
[facility]  Patient Status: Active (receiving services up to time of hospitalization)  [facility] is providing the following services: RN  If patient discharges after hours, please call [phone].   Corliss Parish 08/25/2015, 9:18 AM

## 2015-08-25 NOTE — Care Management (Addendum)
Admitted to [facility] with the diagnosis of melena. Lives alone. Sister is [name] [phone]). Niece is Carola Rhine [phone]),  Dr. Delight Stare is primary care physician, last seen a month ago.  Followed by [facility] since 11/2014. Followed by [facility] by Prudencio Pair since 11/2014. 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 Uses a cane/rolling walker to aid in ambulation. Wheelchair in the home, but doesn't use it. Meals-on-Wheels. [place] in the past. Niece will transport.  Hgb 9.7 this morning. Shelbie Ammons RN MSN CCM Care management [phone]

## 2015-08-25 NOTE — Plan of Care (Signed)
Problem: Fluid Volume: Goal: Ability to maintain a balanced intake and output will improve Outcome: Not Progressing Patient voids incontinent into depends, U/A is ordered and not able to obtain at present. Pt is alert, oriented and aware that urine sample is needed. Attempted bed pan with no success, will continue to encourage patient to call if she has to void.

## 2015-08-25 NOTE — Consult Note (Signed)
GI Inpatient Consult Note  Reason for Consult: Melena   Attending Requesting Consult: Konidena  History of Present Illness: [name] Holmes is a 81 y.o. female with a history of chronic anemia (baseline Hgb approx. 10), HTN, CKD, and h/o EtOH abuse admitted with melena.  Patient states she present to the [facility] after 3 days of dizziness, weakness, SOB, and "black stool".  For the last two weeks or so, she noticed loose stools and intermittent epigastric pain.  This was unusual, because she previously had to take Colace to relieve constipation.  However, stools became more formed and "black" 3 days ago, and epigastric pain was accompanied by nausea w/o vomiting.  Patient mentions stools are black intermittently, not always accompanied by anemia symptoms.  She denies recent weight loss, appetite changes, GERD symptoms, or hematochezia.   Also no significant NSAID use, tobacco, or recent EtOH. Patient does take oral ferrous sulfate 325mg  BID and Omeprazole 20mg  daily.   Upon arrival to the ED, labs revealed Hgb 6.7 with MCV 95.4.  CMP demonstrated worsening BUN (33) and Cr (2.94).  Lipase was also mildly elevated at 71.  Stool was heme +.  2 unit PRBCs were ordered and she was started on IV Protonix 40mg  q 12hrs.  Of note, patients last EGD in 10/2014 (Dr. Gustavo Lah) to evaluate anemia showed a small hiatal hernia and an acquired deformity in the gastric antrum.  There was no evidence of ulceration or bleeding.  She also underwent a colonoscopy in 11/2014 (Dr. Rayann Heman) for melena and IDA which demonstrated only diverticulosis and a 42mm polyp.  Today, Ms. Burn reports fatigue, weakness, and SOB is much improved since receiving 2 units.  Her epigastric pain and nausea are absent today.  She has not had a BM today, but cannot recall if her BM last night was black.  She notes occasional pain with BMs, but otherwise no new GI complaints today.  Past Medical History:  Past Medical History  Diagnosis Date  .  Arthritis   . Hypertension   . Anemia of chronic renal failure   . Gout   . ETOH abuse   . CKD (chronic kidney disease)     Problem List: Patient Active Problem List   Diagnosis Date Noted  . Melena 08/24/2015  . Anemia of chronic kidney failure 04/26/2015  . Pressure ulcer 12/06/2014  . Weakness 12/05/2014  . H/O ETOH abuse 12/05/2014  . HTN (hypertension) 12/05/2014  . Gout 12/05/2014  . Acute on chronic kidney failure ([place]) 11/03/2014  . UTI (lower urinary tract infection) 11/03/2014  . Anemia     Past Surgical History: Past Surgical History  Procedure Laterality Date  . Abdominal hysterectomy    . Esophagogastroduodenoscopy (egd) with propofol N/A 11/08/2014    Procedure: ESOPHAGOGASTRODUODENOSCOPY (EGD) WITH PROPOFOL;  Surgeon: Lollie Sails, MD;  Location: [facility] ENDOSCOPY;  Service: Endoscopy;  Laterality: N/A;  . Colonoscopy with propofol N/A 12/08/2014    Procedure: COLONOSCOPY WITH PROPOFOL;  Surgeon: Josefine Class, MD;  Location: [facility] ENDOSCOPY;  Service: Endoscopy;  Laterality: N/A;    Allergies: No Known Allergies   Home Medications: Prescriptions prior to admission  Medication Sig Dispense Refill Last Dose  . acetaminophen (TYLENOL) 325 MG tablet Take 650 mg by mouth every 6 (six) hours as needed for mild pain or headache.    PRN at PRN  . amLODipine (NORVASC) 5 MG tablet Take 5 mg by mouth daily.    08/23/2015 at Unknown time  . cholecalciferol (VITAMIN  D) 1000 units tablet Take 1,000 Units by mouth daily.   08/23/2015 at Unknown time  . docusate sodium (COLACE) 100 MG capsule Take 100 mg by mouth at bedtime as needed for mild constipation.    08/23/2015 at Unknown time  . ferrous sulfate (FERROUSUL) 325 (65 FE) MG tablet Take 1 tablet (325 mg total) by mouth 2 (two) times daily with a meal. 60 tablet 0 08/23/2015 at Unknown time  . folic acid (FOLVITE) 1 MG tablet Take 1 mg by mouth daily.   08/23/2015 at Unknown time  . levothyroxine (SYNTHROID, LEVOTHROID)  50 MCG tablet Take 50 mcg by mouth every morning.    08/23/2015 at Unknown time  . magnesium oxide (MAG-OX) 400 MG tablet Take 400 mg by mouth daily.   08/23/2015 at Unknown time  . metoprolol tartrate (LOPRESSOR) 25 MG tablet Take 25 mg by mouth 2 (two) times daily.    08/23/2015 at Unknown time  . Multiple Vitamin (MULTIVITAMIN WITH MINERALS) TABS tablet Take 1 tablet by mouth daily.   08/23/2015 at Unknown time  . omeprazole (PRILOSEC) 20 MG capsule Take 20 mg by mouth 2 (two) times daily.    08/23/2015 at Unknown time  . oxybutynin (DITROPAN-XL) 5 MG 24 hr tablet Take 5 mg by mouth at bedtime.   08/23/2015 at Unknown time  . polyethylene glycol (MIRALAX / GLYCOLAX) packet Take 17 g by mouth daily as needed for moderate constipation.    08/23/2015 at Unknown time  . potassium chloride (K-DUR,KLOR-CON) 10 MEQ tablet Take 10 mEq by mouth daily.   08/23/2015 at Unknown time  . thiamine 100 MG tablet Take 100 mg by mouth daily.   08/23/2015 at Unknown time  . traZODone (DESYREL) 50 MG tablet Take 50 mg by mouth at bedtime.    08/23/2015 at Unknown time  . febuxostat (ULORIC) 40 MG tablet Take 40 mg by mouth daily.   Taking  . furosemide (LASIX) 40 MG tablet Take 40 mg by mouth every morning.    Taking  . loperamide (IMODIUM A-D) 2 MG tablet Take 2 mg by mouth 4 (four) times daily as needed for diarrhea or loose stools.   Taking   Home medication reconciliation was completed with the patient.   Scheduled Inpatient Medications:   . febuxostat  40 mg Oral Daily  . folic acid  1 mg Oral Daily  . furosemide  40 mg Oral q morning - 10a  . levothyroxine  50 mcg Oral q morning - 10a  . magnesium oxide  400 mg Oral Daily  . metoprolol tartrate  25 mg Oral BID  . oxybutynin  5 mg Oral QHS  . pantoprazole (PROTONIX) IV  40 mg Intravenous Q12H  . sodium chloride flush  3 mL Intravenous Q12H  . thiamine  100 mg Oral Daily  . traZODone  50 mg Oral QHS    Continuous Inpatient Infusions:     PRN Inpatient  Medications:  acetaminophen **OR** acetaminophen, loperamide, ondansetron **OR** ondansetron (ZOFRAN) IV, oxyCODONE, polyethylene glycol  Family History: family history includes Hypertension in her father.    Social History:   reports that she has quit smoking. Her smoking use included Cigarettes. She has never used smokeless tobacco. She reports that she drinks about 7.2 oz of alcohol per week. She reports that she does not use illicit drugs.   Review of Systems: Constitutional: Weight is stable. + fatigue, malaise Eyes: No changes in vision. ENT: No oral lesions, sore throat.  GI: see HPI.  Heme/Lymph: No easy bruising.  CV: No chest pain.  GU: No hematuria.  Integumentary: No rashes.  Neuro: No headaches.  Psych: No depression/anxiety.  Endocrine: No heat/cold intolerance.  Allergic/Immunologic: No urticaria.  Resp: No cough, SOB.  Musculoskeletal: No joint swelling. + joint pain   Physical Examination: BP 123/83 mmHg  Pulse 69  Temp(Src) 98.1 F (36.7 C) (Oral)  Resp 18  Ht 5\' 4"  (1.626 m)  Wt 53.706 kg (118 lb 6.4 oz)  BMI 20.31 kg/m2  SpO2 100% Gen: NAD, alert and oriented x 4 HEENT: PEERLA, EOMI, Neck: supple, no JVD or thyromegaly Chest: CTA bilaterally, no wheezes, crackles, or other adventitious sounds CV: RRR, no m/g/c/r Abd: soft, mild epigastric TTP, ND, +BS in all four quadrants; no HSM, guarding, ridigity, or rebound tenderness Ext: no edema, well perfused with 2+ pulses, Skin: no rash or lesions noted Lymph: no LAD  Data: Lab Results  Component Value Date   WBC 5.1 08/25/2015   HGB 9.7* 08/25/2015   HCT 29.3* 08/25/2015   MCV 92.5 08/25/2015   PLT 141* 08/25/2015    Recent Labs Lab 08/24/15 1811 08/25/15 0553  HGB 6.7* 9.7*   Lab Results  Component Value Date   NA 137 08/25/2015   K 4.0 08/25/2015   CL 111 08/25/2015   CO2 22 08/25/2015   BUN 35* 08/25/2015   CREATININE 2.89* 08/25/2015   Lab Results  Component Value Date   ALT  26 08/24/2015   AST 64* 08/24/2015   ALKPHOS 105 08/24/2015   BILITOT 0.2* 08/24/2015   No results for input(s): APTT, INR, PTT in the last 168 hours.   Assessment/Plan: Ms. Gallas is a 81 y.o. female with a history of chronic anemia (baseline Hgb approx. 10), HTN, CKD, and h/o EtOH abuse admitted with melena.  Patient reported weakness, dizziness, and "black stools" x 3 days.  Also progressive epigastric pain and nausea over the last week or so.  She is on oral iron BID at home, so stools are black intermittently per patient..  Hgb 6.7, stool heme +.  Previous EGD and colonoscopy less than a year ago to evalaute melena and anemia were unrevealing.  Recommend capsule endoscopy to further evaluate continued symptomatic IDA; this was recommended as outpatient in 03/2015, but patient was lost to follow-up.  Further recs per Dr. Rayann Heman.  Recommendations: - Monitor Hgb, transfuse <7 - Recommend capsule endoscopy given unrevealing EGD/colonoscopy <1 year ago   Thank you for the consult. We will follow along with you. Please call with questions or concerns.  Lavera Guise, PA-C [facility] Phone: [phone] Pager: [phone]

## 2015-08-25 NOTE — Plan of Care (Signed)
Problem: Bowel/Gastric: Goal: Will not experience complications related to bowel motility Outcome: Progressing Pt had 3xloose/watery stools. MD notified. Enteric precautions initiated to r/o c diff. Specimen sent to lab.

## 2015-08-25 NOTE — Progress Notes (Signed)
[facility] [place] NAME: [name] Holmes    MR#:  [id number]  DATE OF BIRTH:  [birth year]  SUBJECTIVE: patient had a melena, symptomatic anemia. Patient received 2 units of packed RBC. Hemoglobin improved from  6.7 to 9.8.  CHIEF COMPLAINT:   Chief Complaint  Patient presents with  . Diarrhea    states loose stool when she lays down x 2 weeks  . Dysuria    x 2 days    REVIEW OF SYSTEMS:   ROS CONSTITUTIONAL: No fever, fatigue or weakness.  EYES: No blurred or double vision.  EARS, NOSE, AND THROAT: No tinnitus or ear pain.  RESPIRATORY: No cough, shortness of breath, wheezing or hemoptysis.  CARDIOVASCULAR: No chest pain, orthopnea, edema.  GASTROINTESTINAL: No nausea, vomiting, diarrhea or abdominal pain.  GENITOURINARY: No dysuria, hematuria.  ENDOCRINE: No polyuria, nocturia,  HEMATOLOGY: No anemia, easy bruising or bleeding SKIN: No rash or lesion. MUSCULOSKELETAL: No joint pain or arthritis.   NEUROLOGIC: No tingling, numbness, weakness.  PSYCHIATRY: No anxiety or depression.   DRUG ALLERGIES:  No Known Allergies  VITALS:  Blood pressure 110/65, pulse 64, temperature 98.1 F (36.7 C), temperature source Oral, resp. rate 18, height 5\' 4"  (1.626 m), weight 53.706 kg (118 lb 6.4 oz), SpO2 100 %.  PHYSICAL EXAMINATION:  GENERAL:  81 y.o.-year-old patient lying in the bed with no acute distress.  EYES: Pupils equal, round, reactive to light and accommodation. No scleral icterus. Extraocular muscles intact.  HEENT: Head atraumatic, normocephalic. Oropharynx and nasopharynx clear.  NECK:  Supple, no jugular venous distention. No thyroid enlargement, no tenderness.  LUNGS: Normal breath sounds bilaterally, no wheezing, rales,rhonchi or crepitation. No use of accessory muscles of respiration.  CARDIOVASCULAR: S1, S2 normal. No murmurs, rubs, or gallops.  ABDOMEN: Soft, nontender, nondistended. Bowel sounds present. No  organomegaly or mass.  EXTREMITIES: No pedal edema, cyanosis, or clubbing.  NEUROLOGIC: Cranial nerves II through XII are intact. Muscle strength 5/5 in all extremities. Sensation intact. Gait not checked.  PSYCHIATRIC: The patient is alert and oriented x 3.  SKIN: No obvious rash, lesion, or ulcer.    LABORATORY PANEL:   CBC  Recent Labs Lab 08/25/15 0553 08/25/15 1050  WBC 5.1  --   HGB 9.7* 9.8*  HCT 29.3*  --   PLT 141*  --    ------------------------------------------------------------------------------------------------------------------  Chemistries   Recent Labs Lab 08/24/15 1811 08/25/15 0553  NA 136 137  K 3.9 4.0  CL 109 111  CO2 20* 22  GLUCOSE 125* 79  BUN 33* 35*  CREATININE 2.94* 2.89*  CALCIUM 8.4* 7.9*  AST 64*  --   ALT 26  --   ALKPHOS 105  --   BILITOT 0.2*  --    ------------------------------------------------------------------------------------------------------------------  Cardiac Enzymes No results for input(s): TROPONINI in the last 168 hours. ------------------------------------------------------------------------------------------------------------------  RADIOLOGY:  No results found.  EKG:   Orders placed or performed during the hospital encounter of 12/05/14  . ED EKG  . ED EKG  . EKG 12-Lead  . EKG 12-Lead  . EKG    ASSESSMENT AND PLAN:    #1 melena with upper GI bleed : patient receiving IV PPIs, blood transfusion. GI consult  Is appreciated.   #2 symptomatic anemia;s/p 2 units transfusion 3.acute on chronic renal failure, chronic kidney disease stage [id number] he Lasix. Monitor kidney function closely. #4 hypertension: Controlled. #5 EtOH  abuse: No withdrawal symptoms at this time.  Chronic gout;continue  uloric  All the records are reviewed and case discussed with Care Management/Social Workerr. Management plans discussed with the patient, family and they are in agreement.  CODE STATUS: full  TOTAL TIME TAKING  CARE OF THIS PATIENT: 35  minutes.   POSSIBLE D/C IN 1-2 DAYS, DEPENDING ON CLINICAL CONDITION.   Epifanio Lesches M.D on 08/25/2015 at 1:02 PM  Between 7am to 6pm - Pager - [phone]  After 6pm go to [email or web address] - password [email or web address] [facility]  Office  [phone]  CC: Primary care physician; Marguerita Merles, MD   Note: This dictation was prepared with Dragon dictation along with smaller phrase technology. Any transcriptional errors that result from this process are unintentional.

## 2015-08-25 NOTE — ED Provider Notes (Signed)
[facility] Emergency Department Provider Note   ____________________________________________  Time seen: ~2110  I have reviewed the triage vital signs and the nursing notes.   HISTORY  Chief Complaint Diarrhea and Dysuria   History limited by: Not Limited   HPI [name] Holmes is a 81 y.o. female who presents to the emergency department today because of concerns for dark stool. She states this is been going on for the past week or so. The patient states that she noticed dark and black stools. No significant abdominal pain. Patient states that she has also felt tired. She has also felt dizzy. This is worse with standing up. The patient denies any fevers. Denies any chest pain.    Past Medical History  Diagnosis Date  . Arthritis   . Hypertension   . Anemia of chronic renal failure   . Gout   . ETOH abuse   . CKD (chronic kidney disease)     Patient Active Problem List   Diagnosis Date Noted  . Melena 08/24/2015  . Anemia of chronic kidney failure 04/26/2015  . Pressure ulcer 12/06/2014  . Weakness 12/05/2014  . H/O ETOH abuse 12/05/2014  . HTN (hypertension) 12/05/2014  . Gout 12/05/2014  . Acute on chronic kidney failure ([place]) 11/03/2014  . UTI (lower urinary tract infection) 11/03/2014  . Anemia     Past Surgical History  Procedure Laterality Date  . Abdominal hysterectomy    . Esophagogastroduodenoscopy (egd) with propofol N/A 11/08/2014    Procedure: ESOPHAGOGASTRODUODENOSCOPY (EGD) WITH PROPOFOL;  Surgeon: Lollie Sails, MD;  Location: [facility] ENDOSCOPY;  Service: Endoscopy;  Laterality: N/A;  . Colonoscopy with propofol N/A 12/08/2014    Procedure: COLONOSCOPY WITH PROPOFOL;  Surgeon: Josefine Class, MD;  Location: [facility] ENDOSCOPY;  Service: Endoscopy;  Laterality: N/A;    No current outpatient prescriptions on file.  Allergies Review of patient's allergies indicates no known allergies.  Family History  Problem Relation Age  of Onset  . Hypertension Father     Social History Social History  Substance Use Topics  . Smoking status: Former Smoker    Types: Cigarettes  . Smokeless tobacco: Never Used  . Alcohol Use: 7.2 oz/week    6 Cans of beer, 6 Shots of liquor per week     Comment: daily    Review of Systems  Constitutional: Negative for fever. Cardiovascular: Negative for chest pain. Respiratory: Negative for shortness of breath. Gastrointestinal: Negative for abdominal pain, vomiting and diarrhea. Neurological: Negative for headaches, focal weakness or numbness.  10-point ROS otherwise negative.  ____________________________________________   PHYSICAL EXAM:  VITAL SIGNS: ED Triage Vitals  Enc Vitals Group     BP 08/24/15 1752 101/64 mmHg     Pulse Rate 08/24/15 1752 93     Resp 08/24/15 1752 18     Temp 08/24/15 1752 98.9 F (37.2 C)     Temp Source 08/24/15 1752 Oral     SpO2 08/24/15 1752 100 %     Weight 08/24/15 1752 135 lb (61.236 kg)     Height 08/24/15 1752 5\' 4"  (1.626 m)     Head Cir --      Peak Flow --      Pain Score 08/25/15 0930 0   Constitutional: Alert and oriented. Well appearing and in no distress. Eyes: Conjunctivae are normal. PERRL. Normal extraocular movements. ENT   Head: Normocephalic and atraumatic.   Nose: No congestion/rhinnorhea.   Mouth/Throat: Mucous membranes are moist.   Neck:  No stridor. Hematological/Lymphatic/Immunilogical: No cervical lymphadenopathy. Cardiovascular: Normal rate, regular rhythm.  No murmurs, rubs, or gallops. Respiratory: Normal respiratory effort without tachypnea nor retractions. Breath sounds are clear and equal bilaterally. No wheezes/rales/rhonchi. Gastrointestinal: Soft and nontender. No distention. There is no CVA tenderness. Grossly melanotic stool on glove. Genitourinary: Deferred Musculoskeletal: Normal range of motion in all extremities. No joint effusions.  No lower extremity tenderness nor  edema. Neurologic:  Normal speech and language. No gross focal neurologic deficits are appreciated.  Skin:  Skin is warm, dry and intact. No rash noted. Psychiatric: Mood and affect are normal. Speech and behavior are normal. Patient exhibits appropriate insight and judgment.  ____________________________________________    LABS (pertinent positives/negatives)  Hgb 6.7  ____________________________________________   EKG  None  ____________________________________________    RADIOLOGY  None   ____________________________________________   PROCEDURES  Procedure(s) performed: None  Critical Care performed: No  ____________________________________________   INITIAL IMPRESSION / ASSESSMENT AND PLAN / ED COURSE  Pertinent labs & imaging results that were available during my care of the patient were reviewed by me and considered in my medical decision making (see chart for details).  Patient presented to the emergency department today because of concerns for dark stools. Blood work shows patient is quite anemic. Exam had a grossly melanotic stool on the glove. Because of this patient was consented for blood transfusion. 2 units ordered. Will plan on admission to the hospital service.  ____________________________________________   FINAL CLINICAL IMPRESSION(S) / ED DIAGNOSES  Anemia Gi bleed  Nance Pear, MD 08/25/15 2156

## 2015-08-26 ENCOUNTER — Encounter
Admission: EM | Disposition: A | Discharge status: Home / Self Care | Payer: Self-pay | Source: Home / Self Care | Attending: Internal Medicine

## 2015-08-26 HISTORY — PX: GIVENS CAPSULE STUDY: SHX5432

## 2015-08-26 LAB — CBC WITH DIFFERENTIAL/PLATELET
BASOS ABS: 0 10*3/uL (ref 0–0.1)
BASOS PCT: 1 %
Eosinophils Absolute: 0.2 10*3/uL (ref 0–0.7)
Eosinophils Relative: 4 %
HEMATOCRIT: 29.9 % — AB (ref 35.0–47.0)
Hemoglobin: 10.2 g/dL — ABNORMAL LOW (ref 12.0–16.0)
LYMPHS PCT: 23 %
Lymphs Abs: 1.3 10*3/uL (ref 1.0–3.6)
MCH: 30.9 pg (ref 26.0–34.0)
MCHC: 34 g/dL (ref 32.0–36.0)
MCV: 90.7 fL (ref 80.0–100.0)
Monocytes Absolute: 0.5 10*3/uL (ref 0.2–0.9)
Monocytes Relative: 8 %
NEUTROS ABS: 3.6 10*3/uL (ref 1.4–6.5)
NEUTROS PCT: 64 %
Platelets: 154 10*3/uL (ref 150–440)
RBC: 3.29 MIL/uL — AB (ref 3.80–5.20)
RDW: 16.9 % — ABNORMAL HIGH (ref 11.5–14.5)
WBC: 5.6 10*3/uL (ref 3.6–11.0)

## 2015-08-26 LAB — TYPE AND SCREEN
ABO/RH(D): O POS
Antibody Screen: NEGATIVE
Unit division: 0
Unit division: 0

## 2015-08-26 LAB — BASIC METABOLIC PANEL
ANION GAP: 4 — AB (ref 5–15)
BUN: 33 mg/dL — ABNORMAL HIGH (ref 6–20)
CALCIUM: 7.2 mg/dL — AB (ref 8.9–10.3)
CO2: 20 mmol/L — ABNORMAL LOW (ref 22–32)
Chloride: 109 mmol/L (ref 101–111)
Creatinine, Ser: 2.58 mg/dL — ABNORMAL HIGH (ref 0.44–1.00)
GFR, EST AFRICAN AMERICAN: 19 mL/min — AB (ref >60)
GFR, EST NON AFRICAN AMERICAN: 17 mL/min — AB (ref >60)
Glucose, Bld: 76 mg/dL (ref 65–99)
POTASSIUM: 4.3 mmol/L (ref 3.5–5.1)
SODIUM: 133 mmol/L — AB (ref 135–145)

## 2015-08-26 SURGERY — IMAGING PROCEDURE, GI TRACT, INTRALUMINAL, VIA CAPSULE

## 2015-08-26 NOTE — Progress Notes (Signed)
[place] [facility]                                                                                                                                                                                            Patient Demographics   [name] Holmes, is a 81 y.o. female, DOB - 07-27-1934, [id number]  Admit date - 08/24/2015   Admitting Physician Hillary Bow, MD  Outpatient Primary MD for the patient is Marguerita Merles, MD   LOS - 2  Subjective: Pt feels better less diarrhea    Review of Systems:   CONSTITUTIONAL: No documented fever. No fatigue, weakness. No weight gain, no weight loss.  EYES: No blurry or double vision.  ENT: No tinnitus. No postnasal drip. No redness of the oropharynx.  RESPIRATORY: No cough, no wheeze, no hemoptysis. No dyspnea.  CARDIOVASCULAR: No chest pain. No orthopnea. No palpitations. No syncope.  GASTROINTESTINAL: No nausea, no vomiting  +diarrhea. No abdominal pain. No melena or hematochezia.  GENITOURINARY: No dysuria or hematuria.  ENDOCRINE: No polyuria or nocturia. No heat or cold intolerance.  HEMATOLOGY: No anemia. No bruising. No bleeding.  INTEGUMENTARY: No rashes. No lesions.  MUSCULOSKELETAL: No arthritis. No swelling. No gout.  NEUROLOGIC: No numbness, tingling, or ataxia. No seizure-type activity.  PSYCHIATRIC: No anxiety. No insomnia. No ADD.    Vitals:   Filed Vitals:   08/25/15 1405 08/25/15 2306 08/26/15 0541 08/26/15 1145  BP: 107/78 99/65 107/62 109/61  Pulse: 64 65 61 66  Temp: 97.7 F (36.5 C) 98.1 F (36.7 C) 98.6 F (37 C)   TempSrc: Oral Oral    Resp: 18 15 18    Height:      Weight:   57.607 kg (127 lb)   SpO2: 100% 97% 100%     Wt Readings from Last 3 Encounters:  08/26/15 57.607 kg (127 lb)  04/26/15 56.9 kg (125 lb 7.1 oz)  03/22/15 60.328 kg (133 lb)     Intake/Output Summary (Last 24 hours) at 08/26/15 1430 Last data filed at 08/25/15 1800  Gross per 24 hour   Intake    360 ml  Output      0 ml  Net    360 ml    Physical Exam:   GENERAL: Pleasant-appearing in no apparent distress.  HEAD, EYES, EARS, NOSE AND THROAT: Atraumatic, normocephalic. Extraocular muscles are intact. Pupils equal and reactive to light. Sclerae anicteric. No conjunctival injection. No oro-pharyngeal erythema.  NECK: Supple. There is no jugular venous distention. No bruits, no lymphadenopathy, no thyromegaly.  HEART: Regular rate and rhythm,. No murmurs, no rubs, no clicks.  LUNGS: Clear to auscultation bilaterally. No rales or rhonchi. No wheezes.  ABDOMEN: Soft, flat, nontender, nondistended. Has good bowel sounds. No hepatosplenomegaly appreciated.  EXTREMITIES: No evidence of any cyanosis, clubbing, or peripheral edema.  +2 pedal and radial pulses bilaterally.  NEUROLOGIC: The patient is alert, awake, and oriented x3 with no focal motor or sensory deficits appreciated bilaterally.  SKIN: Moist and warm with no rashes appreciated.  Psych: Not anxious, depressed LN: No inguinal LN enlargement    Antibiotics   Anti-infectives    Start     Dose/Rate Route Frequency Ordered Stop   08/25/15 1830  cefTRIAXone (ROCEPHIN) 1 g in dextrose 5 % 50 mL IVPB     1 g 100 mL/hr over 30 Minutes Intravenous Every 24 hours 08/25/15 1815        Medications   Scheduled Meds: . cefTRIAXone (ROCEPHIN)  IV  1 g Intravenous Q24H  . febuxostat  40 mg Oral Daily  . folic acid  1 mg Oral Daily  . levothyroxine  50 mcg Oral q morning - 10a  . magnesium oxide  400 mg Oral Daily  . metoprolol tartrate  25 mg Oral BID  . oxybutynin  5 mg Oral QHS  . pantoprazole (PROTONIX) IV  40 mg Intravenous Q12H  . sodium chloride flush  3 mL Intravenous Q12H  . thiamine  100 mg Oral Daily  . traZODone  50 mg Oral QHS   Continuous Infusions:  PRN Meds:.acetaminophen **OR** acetaminophen, loperamide, ondansetron **OR** ondansetron (ZOFRAN) IV, oxyCODONE, polyethylene glycol   Data Review:    Micro Results Recent Results (from the past 240 hour(s))  C difficile quick scan w PCR reflex     Status: None   Collection Time: 08/25/15  6:11 PM  Result Value Ref Range Status   C Diff antigen NEGATIVE NEGATIVE Final   C Diff toxin NEGATIVE NEGATIVE Final   C Diff interpretation Negative for C. difficile  Final    Radiology Reports No results found.   CBC  Recent Labs Lab 08/24/15 1811 08/25/15 0553 08/25/15 1050 08/26/15 1313  WBC 5.0 5.1  --  5.6  HGB 6.7* 9.7* 9.8* 10.2*  HCT 21.2* 29.3*  --  29.9*  PLT 194 141*  --  154  MCV 95.4 92.5  --  90.7  MCH 30.3 30.7  --  30.9  MCHC 31.8* 33.2  --  34.0  RDW 17.9* 15.9*  --  16.9*  LYMPHSABS  --   --   --  1.3  MONOABS  --   --   --  0.5  EOSABS  --   --   --  0.2  BASOSABS  --   --   --  0.0    Chemistries   Recent Labs Lab 08/24/15 1811 08/25/15 0553 08/26/15 0457  NA 136 137 133*  K 3.9 4.0 4.3  CL 109 111 109  CO2 20* 22 20*  GLUCOSE 125* 79 76  BUN 33* 35* 33*  CREATININE 2.94* 2.89* 2.58*  CALCIUM 8.4* 7.9* 7.2*  AST 64*  --   --   ALT 26  --   --   ALKPHOS 105  --   --   BILITOT 0.2*  --   --    ------------------------------------------------------------------------------------------------------------------ estimated creatinine clearance is 15.5 mL/min (by C-G formula based on Cr of 2.58). ------------------------------------------------------------------------------------------------------------------ No results for input(s): HGBA1C in the last 72 hours. ------------------------------------------------------------------------------------------------------------------ No results for input(s): CHOL, HDL, LDLCALC, TRIG, CHOLHDL, LDLDIRECT in the last  72 hours. ------------------------------------------------------------------------------------------------------------------ No results for input(s): TSH, T4TOTAL, T3FREE, THYROIDAB in the last 72 hours.  Invalid input(s):  FREET3 ------------------------------------------------------------------------------------------------------------------ No results for input(s): VITAMINB12, FOLATE, FERRITIN, TIBC, IRON, RETICCTPCT in the last 72 hours.  Coagulation profile No results for input(s): INR, PROTIME in the last 168 hours.  No results for input(s): DDIMER in the last 72 hours.  Cardiac Enzymes No results for input(s): CKMB, TROPONINI, MYOGLOBIN in the last 168 hours.  Invalid input(s): CK ------------------------------------------------------------------------------------------------------------------ Invalid input(s): [place]   1. Upper GI bleed with melena and acute blood loss anemia Clear liquids Protonix IV twice a day Capsule endoscope is being done currenlty Status post transfusion no hemoglobin I'll order a CBC now Previous EGD was negative  2. Hypertension Continue metoprolol   3.Acute renal failure over CKD likely from GI bleed and hypovolemia. Improved with IV hydration.   4. Dysuria. Urinary tract infection continue ceftriaxone  5. Hypothyroidism continue level thyroxine        Code Status Orders        Start     Ordered   08/24/15 2318  Full code   Continuous     08/24/15 2318    Code Status History    Date Active Date Inactive Code Status Order ID Comments User Context   12/05/2014  8:51 PM 12/08/2014 10:13 PM Full Code [id number]  Dustin Flock, MD Inpatient   11/03/2014  9:43 AM 11/08/2014 11:46 PM Full Code [id number]  Harrie Foreman, MD Inpatient           Consults    DVT Prophylaxis  scd's  Lab Results  Component Value Date   PLT 154 08/26/2015     Time Spent in minutes 71min  Greater than 50% of time spent in care coordination and counseling patient regarding the condition and plan of care.   Dustin Flock M.D on 08/26/2015 at 2:30 PM  Between 7am to 6pm - Pager - [phone]  After 6pm go to [email or web address] - password [email or web address]  [facility]   Office  [phone]

## 2015-08-26 NOTE — Progress Notes (Signed)
Per Dr. Dustin Flock okay to place order for pt to have regular diet

## 2015-08-26 NOTE — Progress Notes (Signed)
Glycolax bowel prep not given in evening. Dr. Rayann Heman notified, instructed to give half of the 255 g of Glycolax bowel prep to drink within an hour time frame and be NPO afterwards and should be able to complete test today. Started at [place].

## 2015-08-26 NOTE — Progress Notes (Signed)
Notified Dr. Posey Pronto that family member wanted to make sure that staff was aware that patient probably drinks around a pint of liquor a day as well as several days. Pt show no clear signs of withdrawal. No new orders per Dr. Posey Pronto. Dr. Posey Pronto stated that if patient starts to show signs of withdrawal she may have to be placed on CIWA protocol. Will continue to monitor.

## 2015-08-26 NOTE — Progress Notes (Signed)
Per Dr. Posey Pronto hold Metoprolol for low BP and pulse

## 2015-08-27 LAB — BASIC METABOLIC PANEL
ANION GAP: 4 — AB (ref 5–15)
BUN: 34 mg/dL — ABNORMAL HIGH (ref 6–20)
CALCIUM: 7.3 mg/dL — AB (ref 8.9–10.3)
CO2: 20 mmol/L — AB (ref 22–32)
Chloride: 112 mmol/L — ABNORMAL HIGH (ref 101–111)
Creatinine, Ser: 2.66 mg/dL — ABNORMAL HIGH (ref 0.44–1.00)
GFR, EST AFRICAN AMERICAN: 19 mL/min — AB (ref >60)
GFR, EST NON AFRICAN AMERICAN: 16 mL/min — AB (ref >60)
Glucose, Bld: 81 mg/dL (ref 65–99)
Potassium: 4.2 mmol/L (ref 3.5–5.1)
Sodium: 136 mmol/L (ref 135–145)

## 2015-08-27 LAB — CBC
HEMATOCRIT: 29.7 % — AB (ref 35.0–47.0)
HEMOGLOBIN: 9.9 g/dL — AB (ref 12.0–16.0)
MCH: 30.3 pg (ref 26.0–34.0)
MCHC: 33.3 g/dL (ref 32.0–36.0)
MCV: 91.1 fL (ref 80.0–100.0)
Platelets: 149 10*3/uL — ABNORMAL LOW (ref 150–440)
RBC: 3.26 MIL/uL — AB (ref 3.80–5.20)
RDW: 16.8 % — AB (ref 11.5–14.5)
WBC: 5.5 10*3/uL (ref 3.6–11.0)

## 2015-08-27 NOTE — Progress Notes (Signed)
GI Note:  Hgb stable.   Will read capsule once loaded.  She does not need to stay in hospital for capsule read because Hgb stable and anemia is chronic.

## 2015-08-27 NOTE — Progress Notes (Signed)
[place] at [facility]                                                                                                                                                                                            Patient Demographics   [name] Holmes, is a 81 y.o. female, DOB - 11-27-1934, [id number]  Admit date - 08/24/2015   Admitting Physician Hillary Bow, MD  Outpatient Primary MD for the patient is Marguerita Merles, MD   LOS - 3  Subjective: Patient feels okay no further blood in the stools no diarrhea Some family member reported the patient drinks daily. No evidence of withdrawal symptoms    Review of Systems:   CONSTITUTIONAL: No documented fever. No fatigue, weakness. No weight gain, no weight loss.  EYES: No blurry or double vision.  ENT: No tinnitus. No postnasal drip. No redness of the oropharynx.  RESPIRATORY: No cough, no wheeze, no hemoptysis. No dyspnea.  CARDIOVASCULAR: No chest pain. No orthopnea. No palpitations. No syncope.  GASTROINTESTINAL: No nausea, no vomiting improved diarrhea No abdominal pain. No melena or hematochezia.  GENITOURINARY: No dysuria or hematuria.  ENDOCRINE: No polyuria or nocturia. No heat or cold intolerance.  HEMATOLOGY: No anemia. No bruising. No bleeding.  INTEGUMENTARY: No rashes. No lesions.  MUSCULOSKELETAL: No arthritis. No swelling. No gout.  NEUROLOGIC: No numbness, tingling, or ataxia. No seizure-type activity.  PSYCHIATRIC: No anxiety. No insomnia. No ADD.    Vitals:   Filed Vitals:   08/26/15 1145 08/26/15 1950 08/26/15 2040 08/27/15 0508  BP: 109/61  106/64 112/79  Pulse: 66  72 70  Temp:  98.5 F (36.9 C) 98.9 F (37.2 C) 97.6 F (36.4 C)  TempSrc:  Oral Oral Oral  Resp:   18 18  Height:      Weight:    57.97 kg (127 lb 12.8 oz)  SpO2:   100% 100%    Wt Readings from Last 3 Encounters:  08/27/15 57.97 kg (127 lb 12.8 oz)  04/26/15 56.9 kg (125 lb 7.1 oz)  03/22/15 60.328  kg (133 lb)    No intake or output data in the 24 hours ending 08/27/15 1215  Physical Exam:   GENERAL: Pleasant-appearing in no apparent distress.  HEAD, EYES, EARS, NOSE AND THROAT: Atraumatic, normocephalic. Extraocular muscles are intact. Pupils equal and reactive to light. Sclerae anicteric. No conjunctival injection. No oro-pharyngeal erythema.  NECK: Supple. There is no jugular venous distention. No bruits, no lymphadenopathy, no thyromegaly.  HEART: Regular rate and rhythm,. No murmurs, no rubs, no clicks.  LUNGS: Clear to auscultation bilaterally. No rales or rhonchi.  No wheezes.  ABDOMEN: Soft, flat, nontender, nondistended. Has good bowel sounds. No hepatosplenomegaly appreciated.  EXTREMITIES: No evidence of any cyanosis, clubbing, or peripheral edema.  +2 pedal and radial pulses bilaterally.  NEUROLOGIC: The patient is alert, awake, and oriented x3 with no focal motor or sensory deficits appreciated bilaterally.  SKIN: Moist and warm with no rashes appreciated.  Psych: Not anxious, depressed LN: No inguinal LN enlargement    Antibiotics   Anti-infectives    Start     Dose/Rate Route Frequency Ordered Stop   08/25/15 1830  cefTRIAXone (ROCEPHIN) 1 g in dextrose 5 % 50 mL IVPB     1 g 100 mL/hr over 30 Minutes Intravenous Every 24 hours 08/25/15 1815        Medications   Scheduled Meds: . cefTRIAXone (ROCEPHIN)  IV  1 g Intravenous Q24H  . febuxostat  40 mg Oral Daily  . folic acid  1 mg Oral Daily  . levothyroxine  50 mcg Oral q morning - 10a  . magnesium oxide  400 mg Oral Daily  . metoprolol tartrate  25 mg Oral BID  . oxybutynin  5 mg Oral QHS  . pantoprazole (PROTONIX) IV  40 mg Intravenous Q12H  . sodium chloride flush  3 mL Intravenous Q12H  . thiamine  100 mg Oral Daily  . traZODone  50 mg Oral QHS   Continuous Infusions:  PRN Meds:.acetaminophen **OR** acetaminophen, loperamide, ondansetron **OR** ondansetron (ZOFRAN) IV, oxyCODONE, polyethylene  glycol   Data Review:   Micro Results Recent Results (from the past 240 hour(s))  C difficile quick scan w PCR reflex     Status: None   Collection Time: 08/25/15  6:11 PM  Result Value Ref Range Status   C Diff antigen NEGATIVE NEGATIVE Final   C Diff toxin NEGATIVE NEGATIVE Final   C Diff interpretation Negative for C. difficile  Final    Radiology Reports No results found.   CBC  Recent Labs Lab 08/24/15 1811 08/25/15 0553 08/25/15 1050 08/26/15 1313 08/27/15 0448  WBC 5.0 5.1  --  5.6 5.5  HGB 6.7* 9.7* 9.8* 10.2* 9.9*  HCT 21.2* 29.3*  --  29.9* 29.7*  PLT 194 141*  --  154 149*  MCV 95.4 92.5  --  90.7 91.1  MCH 30.3 30.7  --  30.9 30.3  MCHC 31.8* 33.2  --  34.0 33.3  RDW 17.9* 15.9*  --  16.9* 16.8*  LYMPHSABS  --   --   --  1.3  --   MONOABS  --   --   --  0.5  --   EOSABS  --   --   --  0.2  --   BASOSABS  --   --   --  0.0  --     Chemistries   Recent Labs Lab 08/24/15 1811 08/25/15 0553 08/26/15 0457 08/27/15 0448  NA 136 137 133* 136  K 3.9 4.0 4.3 4.2  CL 109 111 109 112*  CO2 20* 22 20* 20*  GLUCOSE 125* 79 76 81  BUN 33* 35* 33* 34*  CREATININE 2.94* 2.89* 2.58* 2.66*  CALCIUM 8.4* 7.9* 7.2* 7.3*  AST 64*  --   --   --   ALT 26  --   --   --   ALKPHOS 105  --   --   --   BILITOT 0.2*  --   --   --    ------------------------------------------------------------------------------------------------------------------ estimated creatinine clearance is  15.1 mL/min (by C-G formula based on Cr of 2.66). ------------------------------------------------------------------------------------------------------------------ No results for input(s): HGBA1C in the last 72 hours. ------------------------------------------------------------------------------------------------------------------ No results for input(s): CHOL, HDL, LDLCALC, TRIG, CHOLHDL, LDLDIRECT in the last 72  hours. ------------------------------------------------------------------------------------------------------------------ No results for input(s): TSH, T4TOTAL, T3FREE, THYROIDAB in the last 72 hours.  Invalid input(s): FREET3 ------------------------------------------------------------------------------------------------------------------ No results for input(s): VITAMINB12, FOLATE, FERRITIN, TIBC, IRON, RETICCTPCT in the last 72 hours.  Coagulation profile No results for input(s): INR, PROTIME in the last 168 hours.  No results for input(s): DDIMER in the last 72 hours.  Cardiac Enzymes No results for input(s): CKMB, TROPONINI, MYOGLOBIN in the last 168 hours.  Invalid input(s): CK ------------------------------------------------------------------------------------------------------------------ Invalid input(s): [place]   1. Upper GI bleed with melena and acute blood loss anemia Tolerating diet Protonix IV twice a day Capsule endoscope results will be reviewed by GI Hemoglobin is stable today. Continue to monitor Previous EGD was negative  2. Hypertension Continue metoprolol   3.Acute renal failure over CKD likely from GI bleed and hypovolemia. Continue to monitor  4. Dysuria. Change ceftriaxone to Ceftin  5. Hypothyroidism continue level thyroxine  6. Alcohol abuse no evidence of withdrawal       Code Status Orders        Start     Ordered   08/24/15 2318  Full code   Continuous     08/24/15 2318    Code Status History    Date Active Date Inactive Code Status Order ID Comments User Context   12/05/2014  8:51 PM 12/08/2014 10:13 PM Full Code [id number]  Dustin Flock, MD Inpatient   11/03/2014  9:43 AM 11/08/2014 11:46 PM Full Code [id number]  Harrie Foreman, MD Inpatient           Consults  GI  DVT Prophylaxis  scd's  Lab Results  Component Value Date   PLT 149* 08/27/2015     Time Spent in minutes 74min  Greater than 50%  of time spent in care coordination and counseling patient regarding the condition and plan of care.   Dustin Flock M.D on 08/27/2015 at 12:15 PM  Between 7am to 6pm - Pager - [phone]  After 6pm go to [email or web address] - password [email or web address]   Office  [phone]

## 2015-08-28 LAB — BASIC METABOLIC PANEL
ANION GAP: 6 (ref 5–15)
BUN: 31 mg/dL — ABNORMAL HIGH (ref 6–20)
CALCIUM: 7.2 mg/dL — AB (ref 8.9–10.3)
CO2: 19 mmol/L — AB (ref 22–32)
Chloride: 110 mmol/L (ref 101–111)
Creatinine, Ser: 2.73 mg/dL — ABNORMAL HIGH (ref 0.44–1.00)
GFR, EST AFRICAN AMERICAN: 18 mL/min — AB (ref >60)
GFR, EST NON AFRICAN AMERICAN: 16 mL/min — AB (ref >60)
Glucose, Bld: 68 mg/dL (ref 65–99)
POTASSIUM: 3.9 mmol/L (ref 3.5–5.1)
Sodium: 135 mmol/L (ref 135–145)

## 2015-08-28 LAB — CBC
HEMATOCRIT: 28 % — AB (ref 35.0–47.0)
Hemoglobin: 9.3 g/dL — ABNORMAL LOW (ref 12.0–16.0)
MCH: 30.9 pg (ref 26.0–34.0)
MCHC: 33.1 g/dL (ref 32.0–36.0)
MCV: 93.6 fL (ref 80.0–100.0)
Platelets: 141 10*3/uL — ABNORMAL LOW (ref 150–440)
RBC: 2.99 MIL/uL — AB (ref 3.80–5.20)
RDW: 17 % — AB (ref 11.5–14.5)
WBC: 5.2 10*3/uL (ref 3.6–11.0)

## 2015-08-28 MED ORDER — CEFUROXIME AXETIL 250 MG PO TABS: 500.0000 mg | ORAL_TABLET | Freq: Two times a day (BID) | ORAL | Status: DC

## 2015-08-28 MED ORDER — SODIUM CHLORIDE 0.9 % IV BOLUS (SEPSIS)
500.0000 mL | Freq: Once | INTRAVENOUS | Status: AC
Start: 2015-08-28 — End: 2015-08-28
  Administered 2015-08-28: 10:00:00 500 mL via INTRAVENOUS

## 2015-08-28 MED ORDER — CEFUROXIME AXETIL 500 MG PO TABS: 500.0000 mg | ORAL_TABLET | Freq: Two times a day (BID) | ORAL | Status: AC

## 2015-08-28 MED ORDER — PANTOPRAZOLE SODIUM 40 MG PO TBEC: 40.0000 mg | DELAYED_RELEASE_TABLET | Freq: Every day | ORAL | Status: DC

## 2015-08-28 NOTE — Progress Notes (Signed)
GI Note:  Hgb stable.   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  Not bleeding.   Unlikely these could be reached with colonoscope.    Recs: - safe for d/c from GI standpoint - f/u with hematology  - follow Hgb as outpatient - consider device assisted retrograde enteroscopy at tertiary center if develops overt GI bleeding with large drop in Hgbb.

## 2015-08-28 NOTE — Discharge Instructions (Signed)
°  DIET:  Cardiac diet  DISCHARGE CONDITION:  Stable  ACTIVITY:  Activity as tolerated  OXYGEN:  Home Oxygen: No.   Oxygen Delivery: room air  DISCHARGE LOCATION:  home    ADDITIONAL DISCHARGE INSTRUCTION:stop drinking   [place]   If you experience worsening of your admission symptoms, develop shortness of breath, life threatening emergency, suicidal or homicidal thoughts you must seek medical attention immediately by calling 911 or calling your MD immediately  if symptoms less severe.  You Must read complete instructions/literature along with all the possible adverse reactions/side effects for all the Medicines you take and that have been prescribed to you. Take any new Medicines after you have completely understood and accpet all the possible adverse reactions/side effects.   Please note  You were cared for by a hospitalist during your hospital stay. If you have any questions about your discharge medications or the care you received while you were in the hospital after you are discharged, you can call the unit and asked to speak with the hospitalist on call if the hospitalist that took care of you is not available. Once you are discharged, your primary care physician will handle any further medical issues. Please note that NO REFILLS for any discharge medications will be authorized once you are discharged, as it is imperative that you return to your primary care physician (or establish a relationship with a primary care physician if you do not have one) for your aftercare needs so that they can reassess your need for medications and monitor your lab values.

## 2015-08-28 NOTE — Discharge Summary (Signed)
[name] Holmes, 81 y.o., DOB 11-09-1934, MRN [id number]. Admission date: 08/24/2015 Discharge Date 08/28/2015 Primary MD Marguerita Merles, MD Admitting Physician Hillary Bow, MD  Admission Diagnosis  Anemia GI BLEED  Discharge Diagnosis   Active Problems:   Melenaue to possible upper GI bleedstatus post Endoscopy Urinary tract infection  osteoarthritis Hypertension Anemia of chronic disease Chronic renal failure History of gout Alcohol abuse        Hospital Course [name] Holmes is a 81 y.o. female with a known history of chronic anemia, hypertension, CKD, past alcohol abuse presents to the emergency room complaining of dizziness, weakness and 3 days of melena. Patient's stool was positive for blood in the emergency room and hemoglobin is 6.7. Baseline hemoglobin seems to be around 10. She also has some acute renal failure. Patient had EGD in May 2016 which was normal. Also had a recent colonoscopy with no acute bleeding found. Due to these symptoms she was admitted to the hospital.She was seen by GI and due to her EGD and colonoscopy being negative she underwent a capsule endoscopy results are currently pending. She'll be followed up with GI for results of this. She has had no  Further bleeding.She was transfused 3 units of packed RBCs no further symptoms hemoglobin has been stable and she is stable for discharge back to home.          Consults  GI  Significant Tests:  See full reports for all details      No results found.     Today   Subjective:   [name] Holmes  atient feels okay denies any chest pain or shortness of breath  Objective:   Blood pressure 108/72, pulse 60, temperature 98 F (36.7 C), temperature source Oral, resp. rate 18, height 5\' 4"  (1.626 m), weight 57.97 kg (127 lb 12.8 oz), SpO2 100 %.  .  Intake/Output Summary (Last 24 hours) at 08/28/15 1252 Last data filed at 08/28/15 0800  Gross per 24 hour  Intake    360 ml  Output      0 ml  Net     360 ml    Exam VITAL SIGNS: Blood pressure 108/72, pulse 60, temperature 98 F (36.7 C), temperature source Oral, resp. rate 18, height 5\' 4"  (1.626 m), weight 57.97 kg (127 lb 12.8 oz), SpO2 100 %.  GENERAL:  81 y.o.-year-old patient lying in the bed with no acute distress.  EYES: Pupils equal, round, reactive to light and accommodation. No scleral icterus. Extraocular muscles intact.  HEENT: Head atraumatic, normocephalic. Oropharynx and nasopharynx clear.  NECK:  Supple, no jugular venous distention. No thyroid enlargement, no tenderness.  LUNGS: Normal breath sounds bilaterally, no wheezing, rales,rhonchi or crepitation. No use of accessory muscles of respiration.  CARDIOVASCULAR: S1, S2 normal. No murmurs, rubs, or gallops.  ABDOMEN: Soft, nontender, nondistended. Bowel sounds present. No organomegaly or mass.  EXTREMITIES: No pedal edema, cyanosis, or clubbing.  NEUROLOGIC: Cranial nerves II through XII are intact. Muscle strength 5/5 in all extremities. Sensation intact. Gait not checked.  PSYCHIATRIC: The patient is alert and oriented x 3.  SKIN: No obvious rash, lesion, or ulcer.   Data Review     CBC w Diff: Lab Results  Component Value Date   WBC 5.2 08/28/2015   WBC 9.3 06/06/2014   HGB 9.3* 08/28/2015   HGB 9.1* 06/06/2014   HCT 28.0* 08/28/2015   HCT 28.0* 06/06/2014   PLT 141* 08/28/2015   PLT 225 06/06/2014   LYMPHOPCT 23 08/26/2015  LYMPHOPCT 11.4 04/07/2014   MONOPCT 8 08/26/2015   MONOPCT 5.1 04/07/2014   EOSPCT 4 08/26/2015   EOSPCT 0.5 04/07/2014   BASOPCT 1 08/26/2015   BASOPCT 0.9 04/07/2014   CMP: Lab Results  Component Value Date   NA 135 08/28/2015   NA 141 06/06/2014   K 3.9 08/28/2015   K 5.1 06/06/2014   CL 110 08/28/2015   CL 104 06/06/2014   CO2 19* 08/28/2015   CO2 22 06/06/2014   BUN 31* 08/28/2015   BUN 22* 06/06/2014   CREATININE 2.73* 08/28/2015   CREATININE 1.38* 06/06/2014   PROT 6.8 08/24/2015   PROT 6.9 06/06/2014    ALBUMIN 2.7* 08/24/2015   ALBUMIN 2.4* 06/06/2014   BILITOT 0.2* 08/24/2015   BILITOT 0.4 06/06/2014   ALKPHOS 105 08/24/2015   ALKPHOS 71 06/06/2014   AST 64* 08/24/2015   AST 53* 06/06/2014   ALT 26 08/24/2015   ALT 25 06/06/2014  .  Micro Results Recent Results (from the past 240 hour(s))  C difficile quick scan w PCR reflex     Status: None   Collection Time: 08/25/15  6:11 PM  Result Value Ref Range Status   C Diff antigen NEGATIVE NEGATIVE Final   C Diff toxin NEGATIVE NEGATIVE Final   C Diff interpretation Negative for C. difficile  Final        Code Status Orders        Start     Ordered   08/24/15 2318  Full code   Continuous     08/24/15 2318    Code Status History    Date Active Date Inactive Code Status Order ID Comments User Context   12/05/2014  8:51 PM 12/08/2014 10:13 PM Full Code [id number]  Dustin Flock, MD Inpatient   11/03/2014  9:43 AM 11/08/2014 11:46 PM Full Code [id number]  Harrie Foreman, MD Inpatient          Follow-up Information    Follow up with Josefine Class, MD On 09/07/2015.   Specialty:  Gastroenterology   Why:  at 10:30 a.m.   Contact information:   [place] [facility] [place] [address] [phone]       Follow up with Marguerita Merles, MD On 09/12/2015.   Specialty:  Family Medicine   Why:  at 3:10 p.m.   Contact information:   [place] [address] [phone]       Discharge Medications     Medication List    STOP taking these medications        amLODipine 5 MG tablet  Commonly known as:  NORVASC     docusate sodium 100 MG capsule  Commonly known as:  COLACE     omeprazole 20 MG capsule  Commonly known as:  PRILOSEC      TAKE these medications        acetaminophen 325 MG tablet  Commonly known as:  TYLENOL  Take 650 mg by mouth every 6 (six) hours as needed for mild pain or headache.     cefUROXime 500 MG tablet  Commonly known as:  CEFTIN   Take 1 tablet (500 mg total) by mouth 2 (two) times daily with a meal.     cholecalciferol 1000 units tablet  Commonly known as:  VITAMIN D  Take 1,000 Units by mouth daily.     febuxostat 40 MG tablet  Commonly known as:  ULORIC  Take 40 mg by mouth daily.  ferrous sulfate 325 (65 FE) MG tablet  Commonly known as:  FERROUSUL  Take 1 tablet (325 mg total) by mouth 2 (two) times daily with a meal.     folic acid 1 MG tablet  Commonly known as:  FOLVITE  Take 1 mg by mouth daily.     furosemide 40 MG tablet  Commonly known as:  LASIX  Take 40 mg by mouth every morning.     levothyroxine 50 MCG tablet  Commonly known as:  SYNTHROID, LEVOTHROID  Take 50 mcg by mouth every morning.     loperamide 2 MG tablet  Commonly known as:  IMODIUM A-D  Take 2 mg by mouth 4 (four) times daily as needed for diarrhea or loose stools.     magnesium oxide 400 MG tablet  Commonly known as:  MAG-OX  Take 400 mg by mouth daily.     metoprolol tartrate 25 MG tablet  Commonly known as:  LOPRESSOR  Take 25 mg by mouth 2 (two) times daily.     multivitamin with minerals Tabs tablet  Take 1 tablet by mouth daily.     oxybutynin 5 MG 24 hr tablet  Commonly known as:  DITROPAN-XL  Take 5 mg by mouth at bedtime.     pantoprazole 40 MG tablet  Commonly known as:  PROTONIX  Take 1 tablet (40 mg total) by mouth daily.     polyethylene glycol packet  Commonly known as:  MIRALAX / GLYCOLAX  Take 17 g by mouth daily as needed for moderate constipation.     potassium chloride 10 MEQ tablet  Commonly known as:  K-DUR,KLOR-CON  Take 10 mEq by mouth daily.     thiamine 100 MG tablet  Take 100 mg by mouth daily.     traZODone 50 MG tablet  Commonly known as:  DESYREL  Take 50 mg by mouth at bedtime.           Total Time in preparing paper work, data evaluation and todays exam - 35 minutes  Dustin Flock M.D on 08/28/2015 at 12:52 PM  [facility]   Office   [phone]

## 2015-08-28 NOTE — Care Management Important Message (Signed)
Important Message  Patient Details  Name: [name] Holmes MRN: [id number] Date of Birth: 20-Dec-1934   Medicare Important Message Given:  Yes    Juliann Pulse A Rachelann Enloe 08/28/2015, 1:40 PM

## 2015-08-28 NOTE — Progress Notes (Signed)
Patient discharged to home with granddaughter. IV removed. Pt educated, given follow up DR appts and discharge instructions.

## 2015-08-28 NOTE — Care Management (Addendum)
Home health resumption of care orders requested from Dr. Posey Pronto. I have notified Corene Cornea with [facility] of patient discharged to home today. Patient states she will arrange transportation to home today. Case closed.

## 2015-08-29 ENCOUNTER — Encounter: Payer: Self-pay | Admitting: Gastroenterology

## 2015-09-09 ENCOUNTER — Encounter: Payer: Self-pay | Admitting: *Deleted

## 2015-09-09 ENCOUNTER — Emergency Department
Admission: EM | Admit: 2015-09-09 | Discharge: 2015-09-10 | Disposition: A | Discharge status: Skilled Nursing Facility | Payer: Medicare Other | Attending: Emergency Medicine | Admitting: Emergency Medicine

## 2015-09-09 DIAGNOSIS — F1029 Alcohol dependence with unspecified alcohol-induced disorder: Secondary | ICD-10-CM | POA: Diagnosis not present

## 2015-09-09 DIAGNOSIS — D649 Anemia, unspecified: Secondary | ICD-10-CM | POA: Diagnosis not present

## 2015-09-09 DIAGNOSIS — M1A9XX0 Chronic gout, unspecified, without tophus (tophi): Secondary | ICD-10-CM

## 2015-09-09 DIAGNOSIS — Z87891 Personal history of nicotine dependence: Secondary | ICD-10-CM | POA: Insufficient documentation

## 2015-09-09 DIAGNOSIS — Z597 Insufficient social insurance and welfare support: Secondary | ICD-10-CM | POA: Diagnosis not present

## 2015-09-09 DIAGNOSIS — N189 Chronic kidney disease, unspecified: Secondary | ICD-10-CM | POA: Insufficient documentation

## 2015-09-09 DIAGNOSIS — R197 Diarrhea, unspecified: Secondary | ICD-10-CM | POA: Insufficient documentation

## 2015-09-09 DIAGNOSIS — M1A00X0 Idiopathic chronic gout, unspecified site, without tophus (tophi): Secondary | ICD-10-CM | POA: Diagnosis not present

## 2015-09-09 DIAGNOSIS — I129 Hypertensive chronic kidney disease with stage 1 through stage 4 chronic kidney disease, or unspecified chronic kidney disease: Secondary | ICD-10-CM | POA: Diagnosis not present

## 2015-09-09 DIAGNOSIS — R531 Weakness: Secondary | ICD-10-CM | POA: Diagnosis present

## 2015-09-09 DIAGNOSIS — Z658 Other specified problems related to psychosocial circumstances: Secondary | ICD-10-CM

## 2015-09-09 LAB — COMPREHENSIVE METABOLIC PANEL
ALBUMIN: 2.3 g/dL — AB (ref 3.5–5.0)
ALK PHOS: 60 U/L (ref 38–126)
ALT: 31 U/L (ref 14–54)
AST: 63 U/L — AB (ref 15–41)
Anion gap: 11 (ref 5–15)
BUN: 22 mg/dL — ABNORMAL HIGH (ref 6–20)
CO2: 14 mmol/L — AB (ref 22–32)
CREATININE: 2.28 mg/dL — AB (ref 0.44–1.00)
Calcium: 7.8 mg/dL — ABNORMAL LOW (ref 8.9–10.3)
Chloride: 111 mmol/L (ref 101–111)
GFR calc non Af Amer: 19 mL/min — ABNORMAL LOW (ref >60)
GFR, EST AFRICAN AMERICAN: 22 mL/min — AB (ref >60)
GLUCOSE: 70 mg/dL (ref 65–99)
Potassium: 4.7 mmol/L (ref 3.5–5.1)
SODIUM: 136 mmol/L (ref 135–145)
Total Bilirubin: 0.5 mg/dL (ref 0.3–1.2)
Total Protein: 6.4 g/dL — ABNORMAL LOW (ref 6.5–8.1)

## 2015-09-09 LAB — CBC WITH DIFFERENTIAL/PLATELET
BASOS ABS: 0.1 10*3/uL (ref 0–0.1)
EOS ABS: 0.5 10*3/uL (ref 0–0.7)
HCT: 29.9 % — ABNORMAL LOW (ref 35.0–47.0)
HEMOGLOBIN: 9.5 g/dL — AB (ref 12.0–16.0)
Lymphocytes Relative: 25 %
Lymphs Abs: 1.9 10*3/uL (ref 1.0–3.6)
MCH: 30 pg (ref 26.0–34.0)
MCHC: 31.7 g/dL — ABNORMAL LOW (ref 32.0–36.0)
MCV: 94.6 fL (ref 80.0–100.0)
Monocytes Absolute: 0.4 10*3/uL (ref 0.2–0.9)
Monocytes Relative: 6 %
Neutro Abs: 4.6 10*3/uL (ref 1.4–6.5)
PLATELETS: 97 10*3/uL — AB (ref 150–440)
RBC: 3.16 MIL/uL — AB (ref 3.80–5.20)
RDW: 17 % — ABNORMAL HIGH (ref 11.5–14.5)
WBC: 7.4 10*3/uL (ref 3.6–11.0)

## 2015-09-09 LAB — TROPONIN I: Troponin I: 0.03 ng/mL (ref <0.031)

## 2015-09-09 LAB — ETHANOL: ALCOHOL ETHYL (B): 206 mg/dL — AB (ref <5)

## 2015-09-09 MED ORDER — SODIUM CHLORIDE 0.9 % IV SOLN
1000.0000 mL | Freq: Once | INTRAVENOUS | Status: AC
  Administered 2015-09-09: 1000 mL via INTRAVENOUS

## 2015-09-09 MED ORDER — OXYCODONE HCL 5 MG PO TABS
5.0000 mg | ORAL_TABLET | Freq: Once | ORAL | Status: AC
Start: 2015-09-09 — End: 2015-09-09
  Administered 2015-09-09: 5 mg via ORAL
  Filled 2015-09-09: qty 1

## 2015-09-09 NOTE — ED Provider Notes (Addendum)
[facility] Emergency Department Provider Note     Time seen: ----------------------------------------- 8:24 PM on 09/09/2015 -----------------------------------------    I have reviewed the triage vital signs and the nursing notes.   HISTORY  Chief Complaint No chief complaint on file.    HPI [name] Holmes is a 81 y.o. female who presents to ER for Pain and weakness. Patient is complaining of pain in the right ankle, states she has gout. EMS was called to her house her home health nurse because she is unkempt, has a strong urine smell and the house was littered with beer bottles. Patient states she drinks beer and vodka. She denies any fevers or chills, denies chest pain or difficulty breathing.   Past Medical History  Diagnosis Date  . Arthritis   . Hypertension   . Anemia of chronic renal failure   . Gout   . ETOH abuse   . CKD (chronic kidney disease)     Patient Active Problem List   Diagnosis Date Noted  . Melena 08/24/2015  . Anemia of chronic kidney failure 04/26/2015  . Pressure ulcer 12/06/2014  . Weakness 12/05/2014  . H/O ETOH abuse 12/05/2014  . HTN (hypertension) 12/05/2014  . Gout 12/05/2014  . Acute on chronic kidney failure ([place]) 11/03/2014  . UTI (lower urinary tract infection) 11/03/2014  . Anemia     Past Surgical History  Procedure Laterality Date  . Abdominal hysterectomy    . Esophagogastroduodenoscopy (egd) with propofol N/A 11/08/2014    Procedure: ESOPHAGOGASTRODUODENOSCOPY (EGD) WITH PROPOFOL;  Surgeon: Lollie Sails, MD;  Location: [facility] ENDOSCOPY;  Service: Endoscopy;  Laterality: N/A;  . Colonoscopy with propofol N/A 12/08/2014    Procedure: COLONOSCOPY WITH PROPOFOL;  Surgeon: Josefine Class, MD;  Location: [facility] ENDOSCOPY;  Service: Endoscopy;  Laterality: N/A;  . Givens capsule study N/A 08/26/2015    Procedure: GIVENS CAPSULE STUDY;  Surgeon: Josefine Class, MD;  Location: [facility] ENDOSCOPY;   Service: Endoscopy;  Laterality: N/A;    Allergies Review of patient's allergies indicates no known allergies.  Social History Social History  Substance Use Topics  . Smoking status: Former Smoker    Types: Cigarettes  . Smokeless tobacco: Never Used  . Alcohol Use: 7.2 oz/week    6 Cans of beer, 6 Shots of liquor per week     Comment: daily    Review of Systems Constitutional: Negative for fever. Eyes: Negative for visual changes. ENT: Negative for sore throat. Cardiovascular: Negative for chest pain. Respiratory: Negative for shortness of breath. Gastrointestinal: Negative for abdominal pain, vomiting. Positive for diarrhea Genitourinary: Negative for dysuria. Musculoskeletal: Positive for right leg pain Skin: Negative for rash. Neurological: Negative for headaches, positive for weakness  10-point ROS otherwise negative.  ____________________________________________   PHYSICAL EXAM:  VITAL SIGNS: ED Triage Vitals  Enc Vitals Group     BP 09/09/15 2011 118/75 mmHg     Pulse Rate 09/09/15 2011 81     Resp 09/09/15 2011 18     Temp 09/09/15 2011 98.2 F (36.8 C)     Temp Source 09/09/15 2011 Oral     SpO2 09/09/15 2011 99 %     Weight 09/09/15 2011 130 lb (58.968 kg)     Height 09/09/15 2011 5\' 4"  (1.626 m)     Head Cir --      Peak Flow --      Pain Score --      Pain Loc --  Pain Edu? --      Excl. in [place]? --    Constitutional: Alert , Cachectic. No acute distress. Strong urine smell Eyes: Conjunctivae are pale. PERRL. Normal extraocular movements. ENT   Head: Normocephalic and atraumatic.   Nose: No congestion/rhinnorhea.   Mouth/Throat: Mucous membranes are moist.   Neck: No stridor. Cardiovascular: Normal rate, regular rhythm. Normal and symmetric distal pulses are present in all extremities. No murmurs, rubs, or gallops. Respiratory: Normal respiratory effort without tachypnea nor retractions. Breath sounds are clear and equal  bilaterally. No wheezes/rales/rhonchi. Gastrointestinal: Soft and nontender. No distention. No abdominal bruits.  Musculoskeletal: Limited range of motion of the extremities. Chronic arthritic deformities Neurologic:  Normal speech and language. No gross focal neurologic deficits are appreciated.  Skin:  Skin is warm, dry and intact. Pallor is noted Psychiatric: Mood and affect are normal.  ____________________________________________  EKG: Interpreted by me. Sinus rhythm with a rate of 78 bpm, normal PR interval, normal QRS, normal QT interval. Nonspecific T-wave changes.  ____________________________________________  ED COURSE:  Pertinent labs & imaging results that were available during my care of the patient were reviewed by me and considered in my medical decision making (see chart for details). Patient with general complaints and evidence of poor self care. I will check basic labs and reevaluate. ____________________________________________    LABS (pertinent positives/negatives)  Labs Reviewed  CBC WITH DIFFERENTIAL/PLATELET - Abnormal; Notable for the following:    RBC 3.16 (*)    Hemoglobin 9.5 (*)    HCT 29.9 (*)    MCHC 31.7 (*)    RDW 17.0 (*)    Platelets 97 (*)    All other components within normal limits  COMPREHENSIVE METABOLIC PANEL - Abnormal; Notable for the following:    CO2 14 (*)    BUN 22 (*)    Creatinine, Ser 2.28 (*)    Calcium 7.8 (*)    Total Protein 6.4 (*)    Albumin 2.3 (*)    AST 63 (*)    GFR calc non Af Amer 19 (*)    GFR calc Af Amer 22 (*)    All other components within normal limits  ETHANOL - Abnormal; Notable for the following:    Alcohol, Ethyl (B) 206 (*)    All other components within normal limits  TROPONIN I  URINALYSIS COMPLETEWITH MICROSCOPIC ([facility] ONLY)  URINE DRUG SCREEN, QUALITATIVE ([facility] ONLY)   ___________________________________________  FINAL ASSESSMENT AND PLAN  Weakness, anemia, chronic alcoholism, general  debility  Plan: Patient with labs as dictated above. Patient does not appear to be able to care for herself. She is disheveled and reeks of urine. She states the gout is so severe that it is hard for her to walk. Her labs reveal chronic abnormalities but nothing acute. We will keep her here for observation and have social worker evaluate the patient.   Earleen Newport, MD   Earleen Newport, MD 09/09/15 [place], MD 09/09/15 [phone]

## 2015-09-09 NOTE — ED Notes (Signed)
Attempted in and out cath. No urine return. Notified RN.

## 2015-09-09 NOTE — ED Notes (Addendum)
Per EMS report, patient called for assistance earlier in the day to find her remote. Patient's house has strong urine odor, cockroaches and empty alcohol bottles. Patient was sitting in urine-soaked couch. EMS was called out again this evening and had police at scene due to condition of house. Patient c/o right foot pain. EMS report patient was in urine-soaked clothes, had odor of ETOH, and c/o right foot gout pain. Per EMS, patient has a CNA who comes in Monday-Friday and a niece who comes over on the weekend. Patient states she usually can walk with a walker, but has had several falls. Patient states she couldn't get up today due to gout pain. Patient is alert and oriented. Patient states she sleeps on the couch. Patient arrived with strong urine odor, ETOH odor. Clothes were soaked with urine.

## 2015-09-09 NOTE — ED Notes (Signed)
NS infusion stopped at this time due to patient's c/o pain at the IV site.

## 2015-09-09 NOTE — ED Notes (Signed)
Phone call made to Odette Fraction at [facility] [employer] concerning patient's living conditions.

## 2015-09-10 LAB — URINE DRUG SCREEN, QUALITATIVE (ARMC ONLY)
Amphetamines, Ur Screen: NOT DETECTED
BARBITURATES, UR SCREEN: NOT DETECTED
BENZODIAZEPINE, UR SCRN: NOT DETECTED
Cannabinoid 50 Ng, Ur ~~LOC~~: NOT DETECTED
Cocaine Metabolite,Ur ~~LOC~~: NOT DETECTED
MDMA (Ecstasy)Ur Screen: NOT DETECTED
METHADONE SCREEN, URINE: NOT DETECTED
Opiate, Ur Screen: NOT DETECTED
Phencyclidine (PCP) Ur S: NOT DETECTED
TRICYCLIC, UR SCREEN: NOT DETECTED

## 2015-09-10 LAB — URINALYSIS COMPLETE WITH MICROSCOPIC (ARMC ONLY)
Bilirubin Urine: NEGATIVE
Glucose, UA: NEGATIVE mg/dL
Ketones, ur: NEGATIVE mg/dL
Nitrite: NEGATIVE
PH: 5 (ref 5.0–8.0)
Protein, ur: 30 mg/dL — AB
Specific Gravity, Urine: 1.004 — ABNORMAL LOW (ref 1.005–1.030)

## 2015-09-10 NOTE — ED Notes (Signed)
Toileting offered pt refused at this time. RN made aware.

## 2015-09-10 NOTE — ED Notes (Signed)
Pt states she ate some of her breakfast. Trying to rest at this time

## 2015-09-10 NOTE — ED Notes (Signed)
Pt discharged to [facility]. Daughter and niece notified. Pt aware of need to transfer to [facility] facility.

## 2015-09-10 NOTE — NC FL2 (Cosign Needed)
Green LEVEL OF CARE SCREENING TOOL     IDENTIFICATION  Patient Name: [name] Holmes Birthdate: 12/03/1934 Sex: female Admission Date (Current Location): 09/09/2015  [facility] and [place] Number:  Engineering geologist and Address:  [facility], [address], [place], [place] [address]      Provider Number: [id number]  Attending Physician Name and Address:  No att. providers found  Relative Name and Phone Number:       Current Level of Care: Hospital Recommended Level of Care: [place] Prior Approval Number:    Date Approved/Denied:   PASRR Number:  ([id number])  Discharge Plan: SNF    Current Diagnoses: Patient Active Problem List   Diagnosis Date Noted  . Melena 08/24/2015  . Anemia of chronic kidney failure 04/26/2015  . Pressure ulcer 12/06/2014  . Weakness 12/05/2014  . H/O ETOH abuse 12/05/2014  . HTN (hypertension) 12/05/2014  . Gout 12/05/2014  . Acute on chronic kidney failure ([place]) 11/03/2014  . UTI (lower urinary tract infection) 11/03/2014  . Anemia     Orientation RESPIRATION BLADDER Height & Weight     Self, Place  Normal Incontinent Weight: 130 lb (58.968 kg) Height:  5\' 4"  (162.6 cm)  BEHAVIORAL SYMPTOMS/MOOD NEUROLOGICAL BOWEL NUTRITION STATUS   (none )  (none ) Incontinent Diet (Regular Diet )  AMBULATORY STATUS COMMUNICATION OF NEEDS Skin   Extensive Assist Verbally PU Stage and Appropriate Care (History of Pressure Ulcers)                       Personal Care Assistance Level of Assistance  Bathing, Feeding, Dressing Bathing Assistance: Limited assistance Feeding assistance: Limited assistance Dressing Assistance: Limited assistance     Functional Limitations Info  Sight, Hearing, Speech Sight Info: Adequate Hearing Info: Adequate Speech Info: Adequate    SPECIAL CARE FACTORS FREQUENCY  PT (By licensed PT)     PT Frequency:  (5)              Contractures       Additional Factors Info  Code Status, Allergies Code Status Info:  (Full Code. ) Allergies Info:  (No Known Allergies )           Current Medications (09/10/2015):  This is the current hospital active medication list No current facility-administered medications for this encounter.   Current Outpatient Prescriptions  Medication Sig Dispense Refill  . acetaminophen (TYLENOL) 325 MG tablet Take 650 mg by mouth every 6 (six) hours as needed for mild pain or headache.     . cholecalciferol (VITAMIN D) 1000 units tablet Take 1,000 Units by mouth daily.    . febuxostat (ULORIC) 40 MG tablet Take 40 mg by mouth daily.    . ferrous sulfate (FERROUSUL) 325 (65 FE) MG tablet Take 1 tablet (325 mg total) by mouth 2 (two) times daily with a meal. 60 tablet 0  . folic acid (FOLVITE) 1 MG tablet Take 1 mg by mouth daily.    . furosemide (LASIX) 40 MG tablet Take 40 mg by mouth every morning.     Marland Kitchen levothyroxine (SYNTHROID, LEVOTHROID) 50 MCG tablet Take 50 mcg by mouth every morning.     . loperamide (IMODIUM A-D) 2 MG tablet Take 2 mg by mouth 4 (four) times daily as needed for diarrhea or loose stools.    . magnesium oxide (MAG-OX) 400 MG tablet Take 400 mg by mouth daily.    . metoprolol tartrate (  LOPRESSOR) 25 MG tablet Take 25 mg by mouth 2 (two) times daily.     . Multiple Vitamin (MULTIVITAMIN WITH MINERALS) TABS tablet Take 1 tablet by mouth daily.    Marland Kitchen oxybutynin (DITROPAN-XL) 5 MG 24 hr tablet Take 5 mg by mouth at bedtime.    . pantoprazole (PROTONIX) 40 MG tablet Take 1 tablet (40 mg total) by mouth daily. 30 tablet 0  . polyethylene glycol (MIRALAX / GLYCOLAX) packet Take 17 g by mouth daily as needed for moderate constipation.     . potassium chloride (K-DUR,KLOR-CON) 10 MEQ tablet Take 10 mEq by mouth daily.    Marland Kitchen thiamine 100 MG tablet Take 100 mg by mouth daily.    . traZODone (DESYREL) 50 MG tablet Take 50 mg by mouth at bedtime.        Discharge Medications: Please see  discharge summary for a list of discharge medications.  Relevant Imaging Results:  Relevant Lab Results:   Additional Information  (SSN: [id number])  Loralyn Freshwater, LCSW

## 2015-09-10 NOTE — Discharge Instructions (Signed)
Gout Gout is an inflammatory arthritis caused by a buildup of uric acid crystals in the joints. Uric acid is a chemical that is normally present in the blood. When the level of uric acid in the blood is too high it can form crystals that deposit in your joints and tissues. This causes joint redness, soreness, and swelling (inflammation). Repeat attacks are common. Over time, uric acid crystals can form into masses (tophi) near a joint, destroying bone and causing disfigurement. Gout is treatable and often preventable. CAUSES  The disease begins with elevated levels of uric acid in the blood. Uric acid is produced by your body when it breaks down a naturally found substance called purines. Certain foods you eat, such as meats and fish, contain high amounts of purines. Causes of an elevated uric acid level include:  Being passed down from parent to child (heredity).  Diseases that cause increased uric acid production (such as obesity, psoriasis, and certain cancers).  Excessive alcohol use.  Diet, especially diets rich in meat and seafood.  Medicines, including certain cancer-fighting medicines (chemotherapy), water pills (diuretics), and aspirin.  Chronic kidney disease. The kidneys are no longer able to remove uric acid well.  Problems with metabolism. Conditions strongly associated with gout include:  Obesity.  High blood pressure.  High cholesterol.  Diabetes. Not everyone with elevated uric acid levels gets gout. It is not understood why some people get gout and others do not. Surgery, joint injury, and eating too much of certain foods are some of the factors that can lead to gout attacks. SYMPTOMS   An attack of gout comes on quickly. It causes intense pain with redness, swelling, and warmth in a joint.  Fever can occur.  Often, only one joint is involved. Certain joints are more commonly involved:  Base of the big toe.  Knee.  Ankle.  Wrist.  Finger. Without  treatment, an attack usually goes away in a few days to weeks. Between attacks, you usually will not have symptoms, which is different from many other forms of arthritis. DIAGNOSIS  Your caregiver will suspect gout based on your symptoms and exam. In some cases, tests may be recommended. The tests may include:  Blood tests.  Urine tests.  X-rays.  Joint fluid exam. This exam requires a needle to remove fluid from the joint (arthrocentesis). Using a microscope, gout is confirmed when uric acid crystals are seen in the joint fluid. TREATMENT  There are two phases to gout treatment: treating the sudden onset (acute) attack and preventing attacks (prophylaxis).  Treatment of an Acute Attack.  Medicines are used. These include anti-inflammatory medicines or steroid medicines.  An injection of steroid medicine into the affected joint is sometimes necessary.  The painful joint is rested. Movement can worsen the arthritis.  You may use warm or cold treatments on painful joints, depending which works best for you.  Treatment to Prevent Attacks.  If you suffer from frequent gout attacks, your caregiver may advise preventive medicine. These medicines are started after the acute attack subsides. These medicines either help your kidneys eliminate uric acid from your body or decrease your uric acid production. You may need to stay on these medicines for a very long time.  The early phase of treatment with preventive medicine can be associated with an increase in acute gout attacks. For this reason, during the first few months of treatment, your caregiver may also advise you to take medicines usually used for acute gout treatment. Be sure you  understand your caregiver's directions. Your caregiver may make several adjustments to your medicine dose before these medicines are effective.  Discuss dietary treatment with your caregiver or dietitian. Alcohol and drinks high in sugar and fructose and foods  such as meat, poultry, and seafood can increase uric acid levels. Your caregiver or dietitian can advise you on drinks and foods that should be limited. HOME CARE INSTRUCTIONS   Do not take aspirin to relieve pain. This raises uric acid levels.  Only take over-the-counter or prescription medicines for pain, discomfort, or fever as directed by your caregiver.  Rest the joint as much as possible. When in bed, keep sheets and blankets off painful areas.  Keep the affected joint raised (elevated).  Apply warm or cold treatments to painful joints. Use of warm or cold treatments depends on which works best for you.  Use crutches if the painful joint is in your leg.  Drink enough fluids to keep your urine clear or pale yellow. This helps your body get rid of uric acid. Limit alcohol, sugary drinks, and fructose drinks.  Follow your dietary instructions. Pay careful attention to the amount of protein you eat. Your daily diet should emphasize fruits, vegetables, whole grains, and fat-free or low-fat milk products. Discuss the use of coffee, vitamin C, and cherries with your caregiver or dietitian. These may be helpful in lowering uric acid levels.  Maintain a healthy body weight. SEEK MEDICAL CARE IF:   You develop diarrhea, vomiting, or any side effects from medicines.  You do not feel better in 24 hours, or you are getting worse. SEEK IMMEDIATE MEDICAL CARE IF:   Your joint becomes suddenly more tender, and you have chills or a fever. MAKE SURE YOU:   Understand these instructions.  Will watch your condition.  Will get help right away if you are not doing well or get worse.   This information is not intended to replace advice given to you by your health care provider. Make sure you discuss any questions you have with your health care provider.   Document Released: 06/14/2000 Document Revised: 07/08/2014 Document Reviewed: 01/29/2012 Elsevier Interactive Patient Education 2016  Elsevier Inc[employer]  Weakness Weakness is a lack of strength. It may be felt all over the body (generalized) or in one specific part of the body (focal). Some causes of weakness can be serious. You may need further medical evaluation, especially if you are elderly or you have a history of immunosuppression (such as chemotherapy or HIV), kidney disease, heart disease, or diabetes. CAUSES  Weakness can be caused by many different things, including:  Infection.  Physical exhaustion.  Internal bleeding or other blood loss that results in a lack of red blood cells (anemia).  Dehydration. This cause is more common in elderly people.  Side effects or electrolyte abnormalities from medicines, such as pain medicines or sedatives.  Emotional distress, anxiety, or depression.  Circulation problems, especially severe peripheral arterial disease.  Heart disease, such as rapid atrial fibrillation, bradycardia, or heart failure.  Nervous system disorders, such as Guillain-Barr syndrome, multiple sclerosis, or stroke. DIAGNOSIS  To find the cause of your weakness, your caregiver will take your history and perform a physical exam. Lab tests or X-rays may also be ordered, if needed. TREATMENT  Treatment of weakness depends on the cause of your symptoms and can vary greatly. HOME CARE INSTRUCTIONS   Rest as needed.  Eat a well-balanced diet.  Try to get some exercise every day.  Only take over-the-counter  or prescription medicines as directed by your caregiver. SEEK MEDICAL CARE IF:   Your weakness seems to be getting worse or spreads to other parts of your body.  You develop new aches or pains. SEEK IMMEDIATE MEDICAL CARE IF:   You cannot perform your normal daily activities, such as getting dressed and feeding yourself.  You cannot walk up and down stairs, or you feel exhausted when you do so.  You have shortness of breath or chest pain.  You have difficulty moving parts of your  body.  You have weakness in only one area of the body or on only one side of the body.  You have a fever.  You have trouble speaking or swallowing.  You cannot control your bladder or bowel movements.  You have black or bloody vomit or stools. MAKE SURE YOU:  Understand these instructions.  Will watch your condition.  Will get help right away if you are not doing well or get worse.   This information is not intended to replace advice given to you by your health care provider. Make sure you discuss any questions you have with your health care provider.   Document Released: 06/17/2005 Document Revised: 12/17/2011 Document Reviewed: 08/16/2011 Elsevier Interactive Patient Education [employer].

## 2015-09-10 NOTE — Clinical Social Work Note (Signed)
Clinical Social Work Assessment  Patient Details  Name: [name] Holmes MRN: [id number] Date of Birth: [birth year]  Date of referral:  09/10/15               Reason for consult:  Abuse/Neglect, Facility Placement                Permission sought to share information with:  Chartered certified accountant granted to share information::  Yes, Verbal Permission Granted  Name::      Company secretary::   [place]   Relationship::     Contact Information:     Housing/Transportation Living arrangements for the past 2 months:  [place] of Information:  Patient, Other (Comment Required) (Niece [name] Holmes ) Patient Interpreter Needed:  None Criminal Activity/Legal Involvement Pertinent to Current Situation/Hospitalization:  No - Comment as needed Significant Relationships:  Adult Children, Other Family Members, Siblings Lives with:  Self Do you feel safe going back to the place where you live?  Yes Need for family participation in patient care:  Yes (Comment)  Care giving concerns:  Patient lives alone in [place].    Social Worker assessment / plan:  Holiday representative (CSW) received a consult for patient in the ED for SNF placement. Per chart patient came into [facility] ED by EMS. Per EMS report patient's living conditions were poor, beer bottles all over the floor and a strong smell of urine. RN made an Adult Scientist, forensic (APS) report to [facility]. CSW met with patient alone at bedside. Patient was alert and oriented X3. Patient reported that she lives alone in [place]. Per patient she drinks beer and vodka because she likes it. Patient reported that her sister [name] Holmes and daughter [name] Holmes are her primary support. CSW explained SNF placement. Patient reported that she has an aide that comes into her home weekly. CSW explained that patient needs more care then an aide. CSW explained that SNF will provide 24/7 care with Nurses, RN  aides and MD on staff. Patient is agreeable to SNF placement and reported that she has been to [facility] before and would like to go back there. Patient reported that her last admission to [facility] was over 2 months ago. Patient gave CSW permission to call her family. Per MD patient will not be admitted and is ready for D/C.   CSW attempted to contact patient's sister [name] Holmes however [name]'s daughter answered the phone and reported that [name] Holmes is not feeling well. CSW contacted patient's niece [name] Holmes. Per [name] Holmes patient was doing well when she came out of [facility] last time. Per [name] Holmes patient was walking with a cane only, going to church and quit drinking alcohol. Per [name] Holmes in the month of March patient has relapsed started drinking again and has had 3 to 4 falls. CSW made [name] Holmes aware that patient will be going to [facility] today from [facility]. [name] Holmes was happy to hear that and reported that she was going to suggest to her aunt to return to [facility].   CSW contacted on call APS worker and spoke with Myriam Jacobson. Per Myriam Jacobson the nurse did make an APS report. CSW made Myriam Jacobson aware patient will be going to [facility] today.  Per [facility] admissions coordinator at [facility] patient will go to room 2-B. RN will call report and arrange EMS for transport. CSW faxed signed FL2 with medications to facility. Please reconsult if future social work needs arise. CSW signing off.  Employment status:  Disabled (Comment on whether or not currently receiving Disability), Retired Nurse, adult PT Recommendations:  Not assessed at this time Information / Referral to community resources:  [place]  Patient/Family's Response to care: Patient and family are agreeable for patient to go to [facility].   Patient/Family's Understanding of and Emotional Response to Diagnosis, Current Treatment, and Prognosis: Patient was  pleasant throughout assessment and thanked CSW for visit.   Emotional Assessment Appearance:  Appears older than stated age Attitude/Demeanor/Rapport:    Affect (typically observed):  Accepting, Adaptable, Pleasant Orientation:  Oriented to Self, Oriented to Place, Oriented to  Time, Oriented to Situation Alcohol / Substance use:  Alcohol Use Psych involvement (Current and /or in the community):  No (Comment)  Discharge Needs  Concerns to be addressed:  Discharge Planning Concerns Readmission within the last 30 days:  Yes Current discharge risk:  None Barriers to Discharge:  No Barriers Identified   Loralyn Freshwater, LCSW 09/10/2015, 10:12 AM

## 2015-09-10 NOTE — NC FL2 (Signed)
[place] LEVEL OF CARE SCREENING TOOL     IDENTIFICATION  Patient Name: [name] Holmes Birthdate: 11-May-1935 Sex: female Admission Date (Current Location): 09/09/2015  [facility] and [place] Number:  Engineering geologist and Address:  [facility], [address], [place], [place] [address]      Provider Number: [id number]  Attending Physician Name and Address:  No att. providers found  Relative Name and Phone Number:       Current Level of Care: Hospital Recommended Level of Care: [place] Prior Approval Number:    Date Approved/Denied:   PASRR Number:  ([id number])  Discharge Plan: SNF    Current Diagnoses: Patient Active Problem List   Diagnosis Date Noted  . Melena 08/24/2015  . Anemia of chronic kidney failure 04/26/2015  . Pressure ulcer 12/06/2014  . Weakness 12/05/2014  . H/O ETOH abuse 12/05/2014  . HTN (hypertension) 12/05/2014  . Gout 12/05/2014  . Acute on chronic kidney failure ([place]) 11/03/2014  . UTI (lower urinary tract infection) 11/03/2014  . Anemia     Orientation RESPIRATION BLADDER Height & Weight     Self, Place  Normal Incontinent Weight: 130 lb (58.968 kg) Height:  5\' 4"  (162.6 cm)  BEHAVIORAL SYMPTOMS/MOOD NEUROLOGICAL BOWEL NUTRITION STATUS   (none )  (none ) Incontinent Diet (Regular Diet )  AMBULATORY STATUS COMMUNICATION OF NEEDS Skin   Extensive Assist Verbally PU Stage and Appropriate Care (History of Pressure Ulcers)                       Personal Care Assistance Level of Assistance  Bathing, Feeding, Dressing Bathing Assistance: Limited assistance Feeding assistance: Limited assistance Dressing Assistance: Limited assistance     Functional Limitations Info  Sight, Hearing, Speech Sight Info: Adequate Hearing Info: Adequate Speech Info: Adequate    SPECIAL CARE FACTORS FREQUENCY  PT (By licensed PT)     PT Frequency:  (5)              Contractures       Additional Factors Info  Code Status, Allergies Code Status Info:  (Full Code. ) Allergies Info:  (No Known Allergies )           Current Medications (09/10/2015):  This is the current hospital active medication list No current facility-administered medications for this encounter.   Current Outpatient Prescriptions  Medication Sig Dispense Refill  . acetaminophen (TYLENOL) 325 MG tablet Take 650 mg by mouth every 6 (six) hours as needed for mild pain or headache.     . cholecalciferol (VITAMIN D) 1000 units tablet Take 1,000 Units by mouth daily.    . febuxostat (ULORIC) 40 MG tablet Take 40 mg by mouth daily.    . ferrous sulfate (FERROUSUL) 325 (65 FE) MG tablet Take 1 tablet (325 mg total) by mouth 2 (two) times daily with a meal. 60 tablet 0  . folic acid (FOLVITE) 1 MG tablet Take 1 mg by mouth daily.    . furosemide (LASIX) 40 MG tablet Take 40 mg by mouth every morning.     Marland Kitchen levothyroxine (SYNTHROID, LEVOTHROID) 50 MCG tablet Take 50 mcg by mouth every morning.     . loperamide (IMODIUM A-D) 2 MG tablet Take 2 mg by mouth 4 (four) times daily as needed for diarrhea or loose stools.    . magnesium oxide (MAG-OX) 400 MG tablet Take 400 mg by mouth daily.    . metoprolol tartrate (  LOPRESSOR) 25 MG tablet Take 25 mg by mouth 2 (two) times daily.     . Multiple Vitamin (MULTIVITAMIN WITH MINERALS) TABS tablet Take 1 tablet by mouth daily.    Marland Kitchen oxybutynin (DITROPAN-XL) 5 MG 24 hr tablet Take 5 mg by mouth at bedtime.    . pantoprazole (PROTONIX) 40 MG tablet Take 1 tablet (40 mg total) by mouth daily. 30 tablet 0  . polyethylene glycol (MIRALAX / GLYCOLAX) packet Take 17 g by mouth daily as needed for moderate constipation.     . potassium chloride (K-DUR,KLOR-CON) 10 MEQ tablet Take 10 mEq by mouth daily.    Marland Kitchen thiamine 100 MG tablet Take 100 mg by mouth daily.    . traZODone (DESYREL) 50 MG tablet Take 50 mg by mouth at bedtime.        Discharge Medications: Please see  discharge summary for a list of discharge medications.  Relevant Imaging Results:  Relevant Lab Results:   Additional Information  (SSN: [id number])  Loralyn Freshwater, LCSW

## 2015-09-10 NOTE — Clinical Social Work Placement (Signed)
   CLINICAL SOCIAL WORK PLACEMENT  NOTE  Date:  09/10/2015  Patient Details  Name: [name] Holmes MRN: [id number] Date of Birth: [birth year]  Clinical Social Work is seeking post-discharge placement for this patient at the [place] level of care (*CSW will initial, date and re-position this form in  chart as items are completed):  Yes   Patient/family provided with [facility] Work Department's list of facilities offering this level of care within the geographic area requested by the patient (or if unable, by the patient's family).  Yes   Patient/family informed of their freedom to choose among providers that offer the needed level of care, that participate in Medicare, Medicaid or managed care program needed by the patient, have an available bed and are willing to accept the patient.  Yes   Patient/family informed of [facility]'s ownership interest in [facility] and [facility], as well as of the fact that they are under no obligation to receive care at these facilities.  PASRR submitted to EDS on       PASRR number received on       Existing PASRR number confirmed on 09/10/15     FL2 transmitted to all facilities in geographic area requested by pt/family on 09/10/15     FL2 transmitted to all facilities within larger geographic area on       Patient informed that his/her managed care company has contracts with or will negotiate with certain facilities, including the following:        Yes   Patient/family informed of bed offers received.  Patient chooses bed at  [facility]) )     Physician recommends and patient chooses bed at      Patient to be transferred to  [facility] (SNF) ) on 09/10/15.  Patient to be transferred to facility by  [facility] )     Patient family notified on 09/10/15 of transfer.  Name of family member notified:   (Patient's niece [name] was made aware of D/C today. )      PHYSICIAN       Additional Comment:    _______________________________________________ Loralyn Freshwater, LCSW 09/10/2015, 10:10 AM

## 2015-10-08 ENCOUNTER — Emergency Department
Admission: EM | Admit: 2015-10-08 | Discharge: 2015-10-09 | Disposition: A | Discharge status: Home / Self Care | Payer: Medicare Other | Attending: Emergency Medicine | Admitting: Emergency Medicine

## 2015-10-08 DIAGNOSIS — Z87891 Personal history of nicotine dependence: Secondary | ICD-10-CM | POA: Diagnosis not present

## 2015-10-08 DIAGNOSIS — D631 Anemia in chronic kidney disease: Secondary | ICD-10-CM | POA: Diagnosis not present

## 2015-10-08 DIAGNOSIS — N189 Chronic kidney disease, unspecified: Secondary | ICD-10-CM | POA: Insufficient documentation

## 2015-10-08 DIAGNOSIS — R300 Dysuria: Secondary | ICD-10-CM | POA: Diagnosis present

## 2015-10-08 DIAGNOSIS — Z79899 Other long term (current) drug therapy: Secondary | ICD-10-CM | POA: Diagnosis not present

## 2015-10-08 DIAGNOSIS — N390 Urinary tract infection, site not specified: Secondary | ICD-10-CM | POA: Insufficient documentation

## 2015-10-08 DIAGNOSIS — I129 Hypertensive chronic kidney disease with stage 1 through stage 4 chronic kidney disease, or unspecified chronic kidney disease: Secondary | ICD-10-CM | POA: Diagnosis not present

## 2015-10-08 LAB — URINALYSIS COMPLETE WITH MICROSCOPIC (ARMC ONLY)
BILIRUBIN URINE: NEGATIVE
Glucose, UA: NEGATIVE mg/dL
KETONES UR: NEGATIVE mg/dL
NITRITE: NEGATIVE
PH: 5 (ref 5.0–8.0)
Protein, ur: NEGATIVE mg/dL
RBC / HPF: NONE SEEN RBC/hpf (ref 0–5)
SPECIFIC GRAVITY, URINE: 1.006 (ref 1.005–1.030)

## 2015-10-08 MED ORDER — CEPHALEXIN 500 MG PO CAPS
500.0000 mg | ORAL_CAPSULE | Freq: Once | ORAL | Status: AC
  Administered 2015-10-09: 500 mg via ORAL
  Filled 2015-10-08: qty 1

## 2015-10-08 MED ORDER — CEPHALEXIN 500 MG PO CAPS: 500.0000 mg | ORAL_CAPSULE | Freq: Two times a day (BID) | ORAL | Status: AC

## 2015-10-08 NOTE — ED Provider Notes (Signed)
[facility] Emergency Department Provider Note  ____________________________________________  Time seen: Seen upon arrival to the emergency department  I have reviewed the triage vital signs and the nursing notes.   HISTORY  Chief Complaint Dysuria    HPI [name] Holmes is a 81 y.o. female with a history of hypertension as well as alcohol abuse was presenting to the emergency department today with dysuria. She says that the symptoms have been ongoing for about 1 week. She says that she is not urinating more frequently than normal. Denies any fevers. Denies any vaginal bleeding or discharge.   Past Medical History  Diagnosis Date  . Arthritis   . Hypertension   . Anemia of chronic renal failure   . Gout   . ETOH abuse   . CKD (chronic kidney disease)     Patient Active Problem List   Diagnosis Date Noted  . Melena 08/24/2015  . Anemia of chronic kidney failure 04/26/2015  . Pressure ulcer 12/06/2014  . Weakness 12/05/2014  . H/O ETOH abuse 12/05/2014  . HTN (hypertension) 12/05/2014  . Gout 12/05/2014  . Acute on chronic kidney failure ([place]) 11/03/2014  . UTI (lower urinary tract infection) 11/03/2014  . Anemia     Past Surgical History  Procedure Laterality Date  . Abdominal hysterectomy    . Esophagogastroduodenoscopy (egd) with propofol N/A 11/08/2014    Procedure: ESOPHAGOGASTRODUODENOSCOPY (EGD) WITH PROPOFOL;  Surgeon: Lollie Sails, MD;  Location: [facility] ENDOSCOPY;  Service: Endoscopy;  Laterality: N/A;  . Colonoscopy with propofol N/A 12/08/2014    Procedure: COLONOSCOPY WITH PROPOFOL;  Surgeon: Josefine Class, MD;  Location: [facility] ENDOSCOPY;  Service: Endoscopy;  Laterality: N/A;  . Givens capsule study N/A 08/26/2015    Procedure: GIVENS CAPSULE STUDY;  Surgeon: Josefine Class, MD;  Location: [facility] ENDOSCOPY;  Service: Endoscopy;  Laterality: N/A;    Current Outpatient Rx  Name  Route  Sig  Dispense  Refill  .  acetaminophen (TYLENOL) 325 MG tablet   Oral   Take 650 mg by mouth every 6 (six) hours as needed for mild pain or headache.          . cholecalciferol (VITAMIN D) 1000 units tablet   Oral   Take 1,000 Units by mouth daily.         . febuxostat (ULORIC) 40 MG tablet   Oral   Take 40 mg by mouth daily.         . ferrous sulfate (FERROUSUL) 325 (65 FE) MG tablet   Oral   Take 1 tablet (325 mg total) by mouth 2 (two) times daily with a meal.   60 tablet   0   . folic acid (FOLVITE) 1 MG tablet   Oral   Take 1 mg by mouth daily.         . furosemide (LASIX) 40 MG tablet   Oral   Take 40 mg by mouth every morning.          Marland Kitchen levothyroxine (SYNTHROID, LEVOTHROID) 50 MCG tablet   Oral   Take 50 mcg by mouth every morning.          . loperamide (IMODIUM A-D) 2 MG tablet   Oral   Take 2 mg by mouth 4 (four) times daily as needed for diarrhea or loose stools.         . magnesium oxide (MAG-OX) 400 MG tablet   Oral   Take 400 mg by mouth daily.         Marland Kitchen  metoprolol tartrate (LOPRESSOR) 25 MG tablet   Oral   Take 25 mg by mouth 2 (two) times daily.          . Multiple Vitamin (MULTIVITAMIN WITH MINERALS) TABS tablet   Oral   Take 1 tablet by mouth daily.         Marland Kitchen oxybutynin (DITROPAN-XL) 5 MG 24 hr tablet   Oral   Take 5 mg by mouth at bedtime.         . pantoprazole (PROTONIX) 40 MG tablet   Oral   Take 1 tablet (40 mg total) by mouth daily.   30 tablet   0   . polyethylene glycol (MIRALAX / GLYCOLAX) packet   Oral   Take 17 g by mouth daily as needed for moderate constipation.          . potassium chloride (K-DUR,KLOR-CON) 10 MEQ tablet   Oral   Take 10 mEq by mouth daily.         Marland Kitchen thiamine 100 MG tablet   Oral   Take 100 mg by mouth daily.         . traZODone (DESYREL) 50 MG tablet   Oral   Take 50 mg by mouth at bedtime.            Allergies Review of patient's allergies indicates no known allergies.  Family History   Problem Relation Age of Onset  . Hypertension Father     Social History Social History  Substance Use Topics  . Smoking status: Former Smoker    Types: Cigarettes  . Smokeless tobacco: Never Used  . Alcohol Use: 7.2 oz/week    6 Cans of beer, 6 Shots of liquor per week     Comment: daily    Review of Systems Constitutional: No fever/chills Eyes: No visual changes. ENT: No sore throat. Cardiovascular: Denies chest pain. Respiratory: Denies shortness of breath. Gastrointestinal: No abdominal pain.  No nausea, no vomiting.  No diarrhea.  No constipation. Genitourinary: As above Musculoskeletal: Negative for back pain. Skin: Negative for rash. Neurological: Negative for headaches, focal weakness or numbness.  10-point ROS otherwise negative.  ____________________________________________   PHYSICAL EXAM:  VITAL SIGNS: ED Triage Vitals  Enc Vitals Group     BP 10/08/15 2150 113/74 mmHg     Pulse Rate 10/08/15 2150 91     Resp 10/08/15 2150 18     Temp 10/08/15 2150 98.5 F (36.9 C)     Temp src --      SpO2 10/08/15 2150 100 %     Weight --      Height --      Head Cir --      Peak Flow --      Pain Score 10/08/15 2151 0     Pain Loc --      Pain Edu? --      Excl. in [place]? --     Constitutional: Alert and oriented. Well appearing and in no acute distress. Eyes: Conjunctivae are normal. PERRL. EOMI. Head: Atraumatic. Nose: No congestion/rhinnorhea. Mouth/Throat: Mucous membranes are moist.   Neck: No stridor.   Cardiovascular: Normal rate, regular rhythm. Grossly normal heart sounds.   Respiratory: Normal respiratory effort.  No retractions. Lungs CTAB. Gastrointestinal: Soft and nontender. No distention. No abdominal bruits. No CVA tenderness. Musculoskeletal: No lower extremity tenderness nor edema.  No joint effusions. Neurologic:  Normal speech and language. No gross focal neurologic deficits are appreciated. Skin:  Skin is warm, dry  and intact. No rash  noted. Psychiatric: Mood and affect are normal. Speech and behavior are normal.  ____________________________________________   LABS (all labs ordered are listed, but only abnormal results are displayed)  Labs Reviewed  URINALYSIS COMPLETEWITH MICROSCOPIC ([place]) - Abnormal; Notable for the following:    Color, Urine YELLOW (*)    APPearance CLOUDY (*)    Hgb urine dipstick 1+ (*)    Leukocytes, UA 2+ (*)    Bacteria, UA MANY (*)    Squamous Epithelial / LPF 0-5 (*)    All other components within normal limits   ____________________________________________  EKG   ____________________________________________  RADIOLOGY   ____________________________________________   PROCEDURES  ____________________________________________   INITIAL IMPRESSION / ASSESSMENT AND PLAN / ED COURSE  Pertinent labs & imaging results that were available during my care of the patient were reviewed by me and considered in my medical decision making (see chart for details).  ----------------------------------------- 11:43 PM on 10/08/2015 -----------------------------------------  Patient found to be positive for urinary tract infection. We'll treat with antibiotics. We'll give first dose and then discharged home. Patient understands the plan is going to comply. She is understanding of her diagnosis ____________________________________________   FINAL CLINICAL IMPRESSION(S) / ED DIAGNOSES  Urinary tract infection.    Orbie Pyo, MD 10/08/15 [phone]

## 2015-10-08 NOTE — Discharge Instructions (Signed)

## 2015-10-08 NOTE — ED Notes (Signed)
Patient attempted and was unable to void in the room commode. In and out cath was performed. Patient tolerated procedure well. Urine appeared clear and yellow. No redness or irritation was noted during the procedure.

## 2015-10-08 NOTE — ED Notes (Signed)
Pt reports Burning and itching when urination. Pt also reports both of her eyes and burning and itching.

## 2015-10-13 DIAGNOSIS — N184 Chronic kidney disease, stage 4 (severe): Secondary | ICD-10-CM | POA: Diagnosis present

## 2015-10-13 DIAGNOSIS — I129 Hypertensive chronic kidney disease with stage 1 through stage 4 chronic kidney disease, or unspecified chronic kidney disease: Secondary | ICD-10-CM | POA: Diagnosis present

## 2015-10-13 DIAGNOSIS — Z90710 Acquired absence of both cervix and uterus: Secondary | ICD-10-CM

## 2015-10-13 DIAGNOSIS — D631 Anemia in chronic kidney disease: Secondary | ICD-10-CM | POA: Diagnosis present

## 2015-10-13 DIAGNOSIS — Z79899 Other long term (current) drug therapy: Secondary | ICD-10-CM

## 2015-10-13 DIAGNOSIS — F1010 Alcohol abuse, uncomplicated: Secondary | ICD-10-CM | POA: Diagnosis present

## 2015-10-13 DIAGNOSIS — Z9889 Other specified postprocedural states: Secondary | ICD-10-CM

## 2015-10-13 DIAGNOSIS — M1A9XX0 Chronic gout, unspecified, without tophus (tophi): Secondary | ICD-10-CM | POA: Diagnosis present

## 2015-10-13 DIAGNOSIS — Z87891 Personal history of nicotine dependence: Secondary | ICD-10-CM

## 2015-10-13 DIAGNOSIS — Z8249 Family history of ischemic heart disease and other diseases of the circulatory system: Secondary | ICD-10-CM

## 2015-10-13 DIAGNOSIS — K921 Melena: Secondary | ICD-10-CM | POA: Diagnosis not present

## 2015-10-13 DIAGNOSIS — E875 Hyperkalemia: Secondary | ICD-10-CM | POA: Diagnosis present

## 2015-10-13 DIAGNOSIS — M1990 Unspecified osteoarthritis, unspecified site: Secondary | ICD-10-CM | POA: Diagnosis present

## 2015-10-13 DIAGNOSIS — E871 Hypo-osmolality and hyponatremia: Secondary | ICD-10-CM | POA: Diagnosis present

## 2015-10-13 DIAGNOSIS — Z23 Encounter for immunization: Secondary | ICD-10-CM

## 2015-10-13 NOTE — ED Notes (Signed)
Yellow fall braclet placed on right wrist.  Patient given warm blanket to wrap around right leg to help with pain.

## 2015-10-13 NOTE — ED Notes (Signed)
Patient reports pain to her right leg from gout for over a month.

## 2015-10-14 ENCOUNTER — Inpatient Hospital Stay
Admission: EM | Admit: 2015-10-14 | Discharge: 2015-10-16 | DRG: 378 | Disposition: A | Discharge status: Home / Self Care | Payer: Medicare Other | Attending: Internal Medicine | Admitting: Internal Medicine

## 2015-10-14 ENCOUNTER — Encounter: Payer: Self-pay | Admitting: *Deleted

## 2015-10-14 DIAGNOSIS — K921 Melena: Secondary | ICD-10-CM | POA: Diagnosis present

## 2015-10-14 DIAGNOSIS — Z9889 Other specified postprocedural states: Secondary | ICD-10-CM | POA: Diagnosis not present

## 2015-10-14 DIAGNOSIS — K922 Gastrointestinal hemorrhage, unspecified: Secondary | ICD-10-CM | POA: Diagnosis present

## 2015-10-14 DIAGNOSIS — Z23 Encounter for immunization: Secondary | ICD-10-CM | POA: Diagnosis not present

## 2015-10-14 DIAGNOSIS — Z87891 Personal history of nicotine dependence: Secondary | ICD-10-CM | POA: Diagnosis not present

## 2015-10-14 DIAGNOSIS — M1A4710 Other secondary chronic gout, right ankle and foot, without tophus (tophi): Secondary | ICD-10-CM

## 2015-10-14 DIAGNOSIS — N184 Chronic kidney disease, stage 4 (severe): Secondary | ICD-10-CM | POA: Diagnosis present

## 2015-10-14 DIAGNOSIS — M1A9XX0 Chronic gout, unspecified, without tophus (tophi): Secondary | ICD-10-CM | POA: Diagnosis present

## 2015-10-14 DIAGNOSIS — Z90710 Acquired absence of both cervix and uterus: Secondary | ICD-10-CM | POA: Diagnosis not present

## 2015-10-14 DIAGNOSIS — F1010 Alcohol abuse, uncomplicated: Secondary | ICD-10-CM | POA: Diagnosis present

## 2015-10-14 DIAGNOSIS — M79604 Pain in right leg: Secondary | ICD-10-CM

## 2015-10-14 DIAGNOSIS — E871 Hypo-osmolality and hyponatremia: Secondary | ICD-10-CM | POA: Diagnosis present

## 2015-10-14 DIAGNOSIS — D631 Anemia in chronic kidney disease: Secondary | ICD-10-CM | POA: Diagnosis present

## 2015-10-14 DIAGNOSIS — Z8249 Family history of ischemic heart disease and other diseases of the circulatory system: Secondary | ICD-10-CM | POA: Diagnosis not present

## 2015-10-14 DIAGNOSIS — I129 Hypertensive chronic kidney disease with stage 1 through stage 4 chronic kidney disease, or unspecified chronic kidney disease: Secondary | ICD-10-CM | POA: Diagnosis present

## 2015-10-14 DIAGNOSIS — E875 Hyperkalemia: Secondary | ICD-10-CM | POA: Diagnosis present

## 2015-10-14 DIAGNOSIS — M1990 Unspecified osteoarthritis, unspecified site: Secondary | ICD-10-CM | POA: Diagnosis present

## 2015-10-14 DIAGNOSIS — D649 Anemia, unspecified: Secondary | ICD-10-CM

## 2015-10-14 DIAGNOSIS — Z79899 Other long term (current) drug therapy: Secondary | ICD-10-CM | POA: Diagnosis not present

## 2015-10-14 DIAGNOSIS — R531 Weakness: Secondary | ICD-10-CM

## 2015-10-14 LAB — BASIC METABOLIC PANEL
Anion gap: 9 (ref 5–15)
BUN: 25 mg/dL — ABNORMAL HIGH (ref 6–20)
CHLORIDE: 108 mmol/L (ref 101–111)
CO2: 16 mmol/L — AB (ref 22–32)
Calcium: 8 mg/dL — ABNORMAL LOW (ref 8.9–10.3)
Creatinine, Ser: 2.33 mg/dL — ABNORMAL HIGH (ref 0.44–1.00)
GFR calc non Af Amer: 19 mL/min — ABNORMAL LOW (ref >60)
GFR, EST AFRICAN AMERICAN: 22 mL/min — AB (ref >60)
Glucose, Bld: 69 mg/dL (ref 65–99)
POTASSIUM: 4.3 mmol/L (ref 3.5–5.1)
SODIUM: 133 mmol/L — AB (ref 135–145)

## 2015-10-14 LAB — HEMOGLOBIN
HEMOGLOBIN: 7.2 g/dL — AB (ref 12.0–16.0)
HEMOGLOBIN: 10.1 g/dL — AB (ref 12.0–16.0)

## 2015-10-14 LAB — CBC WITH DIFFERENTIAL/PLATELET
BASOS PCT: 1 %
Basophils Absolute: 0.1 10*3/uL (ref 0–0.1)
EOS ABS: 0.4 10*3/uL (ref 0–0.7)
Eosinophils Relative: 6 %
HEMATOCRIT: 24.2 % — AB (ref 35.0–47.0)
HEMOGLOBIN: 8 g/dL — AB (ref 12.0–16.0)
LYMPHS ABS: 2.4 10*3/uL (ref 1.0–3.6)
Lymphocytes Relative: 37 %
MCH: 30.9 pg (ref 26.0–34.0)
MCHC: 32.9 g/dL (ref 32.0–36.0)
MCV: 94 fL (ref 80.0–100.0)
MONOS PCT: 6 %
Monocytes Absolute: 0.4 10*3/uL (ref 0.2–0.9)
NEUTROS ABS: 3.3 10*3/uL (ref 1.4–6.5)
NEUTROS PCT: 50 %
Platelets: 141 10*3/uL — ABNORMAL LOW (ref 150–440)
RBC: 2.57 MIL/uL — AB (ref 3.80–5.20)
RDW: 16 % — ABNORMAL HIGH (ref 11.5–14.5)
WBC: 6.6 10*3/uL (ref 3.6–11.0)

## 2015-10-14 LAB — URIC ACID: Uric Acid, Serum: 4 mg/dL (ref 2.3–6.6)

## 2015-10-14 LAB — PROTIME-INR
INR: 1.33
Prothrombin Time: 16.6 seconds — ABNORMAL HIGH (ref 11.4–15.0)

## 2015-10-14 LAB — PREPARE RBC (CROSSMATCH)

## 2015-10-14 LAB — ETHANOL: ALCOHOL ETHYL (B): 126 mg/dL — AB (ref <5)

## 2015-10-14 LAB — TROPONIN I: Troponin I: <0.03 ng/mL (ref <0.031)

## 2015-10-14 MED ORDER — ONDANSETRON 4 MG PO TBDP
4.0000 mg | ORAL_TABLET | Freq: Once | ORAL | Status: AC
  Administered 2015-10-14: 4 mg via ORAL
  Filled 2015-10-14: qty 1

## 2015-10-14 MED ORDER — SODIUM CHLORIDE 0.9 % IV SOLN
10.0000 mL/h | Freq: Once | INTRAVENOUS | Status: AC
  Administered 2015-10-14: 10 mL/h via INTRAVENOUS

## 2015-10-14 MED ORDER — ACETAMINOPHEN 325 MG PO TABS: 650.0000 mg | ORAL_TABLET | Freq: Four times a day (QID) | ORAL | Status: DC | PRN

## 2015-10-14 MED ORDER — LORAZEPAM 2 MG/ML IJ SOLN
0.0000 mg | Freq: Four times a day (QID) | INTRAMUSCULAR | Status: AC
  Administered 2015-10-14: 1 mg via INTRAVENOUS

## 2015-10-14 MED ORDER — LEVOTHYROXINE SODIUM 50 MCG PO TABS
50.0000 ug | ORAL_TABLET | Freq: Every day | ORAL | Status: DC
  Administered 2015-10-14 – 2015-10-16 (×3): 50 ug via ORAL
  Filled 2015-10-14 (×3): qty 1

## 2015-10-14 MED ORDER — LORAZEPAM 2 MG/ML IJ SOLN
INTRAMUSCULAR | Status: AC
  Filled 2015-10-14: qty 1

## 2015-10-14 MED ORDER — ALBUTEROL SULFATE (2.5 MG/3ML) 0.083% IN NEBU: 2.5000 mg | INHALATION_SOLUTION | RESPIRATORY_TRACT | Status: DC | PRN

## 2015-10-14 MED ORDER — ONDANSETRON HCL 4 MG/2ML IJ SOLN: 4.0000 mg | Freq: Four times a day (QID) | INTRAMUSCULAR | Status: DC | PRN

## 2015-10-14 MED ORDER — TRAZODONE HCL 50 MG PO TABS
50.0000 mg | ORAL_TABLET | Freq: Every day | ORAL | Status: DC
  Administered 2015-10-14 – 2015-10-15 (×2): 50 mg via ORAL
  Filled 2015-10-14 (×2): qty 1

## 2015-10-14 MED ORDER — PANTOPRAZOLE SODIUM 40 MG IV SOLR
40.0000 mg | Freq: Once | INTRAVENOUS | Status: AC
  Administered 2015-10-14: 40 mg via INTRAVENOUS
  Filled 2015-10-14: qty 40

## 2015-10-14 MED ORDER — SODIUM CHLORIDE 0.9 % IV BOLUS (SEPSIS)
500.0000 mL | Freq: Once | INTRAVENOUS | Status: AC
  Administered 2015-10-14: 500 mL via INTRAVENOUS

## 2015-10-14 MED ORDER — FOLIC ACID 1 MG PO TABS
1.0000 mg | ORAL_TABLET | Freq: Every day | ORAL | Status: DC
  Administered 2015-10-14 – 2015-10-16 (×3): 1 mg via ORAL
  Filled 2015-10-14 (×3): qty 1

## 2015-10-14 MED ORDER — POTASSIUM CHLORIDE CRYS ER 10 MEQ PO TBCR
10.0000 meq | EXTENDED_RELEASE_TABLET | Freq: Every day | ORAL | Status: DC
  Administered 2015-10-14 – 2015-10-15 (×2): 10 meq via ORAL
  Filled 2015-10-14 (×2): qty 1

## 2015-10-14 MED ORDER — SODIUM CHLORIDE 0.9 % IV SOLN
8.0000 mg/h | INTRAVENOUS | Status: DC
  Administered 2015-10-14 (×2): 8 mg/h via INTRAVENOUS
  Filled 2015-10-14 (×3): qty 80

## 2015-10-14 MED ORDER — THIAMINE HCL 100 MG/ML IJ SOLN
100.0000 mg | Freq: Every day | INTRAMUSCULAR | Status: DC
  Administered 2015-10-14 – 2015-10-16 (×3): 100 mg via INTRAVENOUS
  Filled 2015-10-14 (×3): qty 2

## 2015-10-14 MED ORDER — ACETAMINOPHEN 650 MG RE SUPP: 650.0000 mg | Freq: Four times a day (QID) | RECTAL | Status: DC | PRN

## 2015-10-14 MED ORDER — OXYBUTYNIN CHLORIDE ER 5 MG PO TB24
5.0000 mg | ORAL_TABLET | Freq: Every day | ORAL | Status: DC
  Administered 2015-10-14 – 2015-10-15 (×2): 5 mg via ORAL
  Filled 2015-10-14 (×4): qty 1

## 2015-10-14 MED ORDER — MAGNESIUM OXIDE 400 (241.3 MG) MG PO TABS
400.0000 mg | ORAL_TABLET | Freq: Every day | ORAL | Status: DC
  Administered 2015-10-14 – 2015-10-16 (×3): 400 mg via ORAL
  Filled 2015-10-14 (×3): qty 1

## 2015-10-14 MED ORDER — SODIUM CHLORIDE 0.9 % IV SOLN
INTRAVENOUS | Status: DC
  Administered 2015-10-14: 11:00:00 via INTRAVENOUS

## 2015-10-14 MED ORDER — ONDANSETRON HCL 4 MG PO TABS: 4.0000 mg | ORAL_TABLET | Freq: Four times a day (QID) | ORAL | Status: DC | PRN

## 2015-10-14 MED ORDER — PNEUMOCOCCAL VAC POLYVALENT 25 MCG/0.5ML IJ INJ
0.5000 mL | INJECTION | INTRAMUSCULAR | Status: AC
  Administered 2015-10-15: 0.5 mL via INTRAMUSCULAR
  Filled 2015-10-14: qty 0.5

## 2015-10-14 MED ORDER — DIPHENHYDRAMINE HCL 50 MG/ML IJ SOLN
25.0000 mg | Freq: Four times a day (QID) | INTRAMUSCULAR | Status: DC | PRN
  Administered 2015-10-14 – 2015-10-15 (×3): 25 mg via INTRAVENOUS
  Filled 2015-10-14 (×2): qty 1

## 2015-10-14 MED ORDER — ADULT MULTIVITAMIN W/MINERALS CH
1.0000 | ORAL_TABLET | Freq: Every day | ORAL | Status: DC
Start: 2015-10-14 — End: 2015-10-16
  Administered 2015-10-14 – 2015-10-16 (×3): 1 via ORAL
  Filled 2015-10-14 (×3): qty 1

## 2015-10-14 MED ORDER — FEBUXOSTAT 40 MG PO TABS
40.0000 mg | ORAL_TABLET | Freq: Every day | ORAL | Status: DC
  Administered 2015-10-14 – 2015-10-16 (×3): 40 mg via ORAL
  Filled 2015-10-14 (×3): qty 1

## 2015-10-14 MED ORDER — OXYCODONE-ACETAMINOPHEN 5-325 MG PO TABS
1.0000 | ORAL_TABLET | Freq: Once | ORAL | Status: AC
  Administered 2015-10-14: 1 via ORAL
  Filled 2015-10-14: qty 1

## 2015-10-14 MED ORDER — OXYCODONE-ACETAMINOPHEN 5-325 MG PO TABS
1.0000 | ORAL_TABLET | Freq: Four times a day (QID) | ORAL | Status: DC | PRN
  Administered 2015-10-14 – 2015-10-15 (×2): 1 via ORAL
  Filled 2015-10-14 (×2): qty 1

## 2015-10-14 NOTE — ED Provider Notes (Signed)
[facility] Emergency Department Provider Note  ____________________________________________  Time seen: Approximately 5:02 AM  I have reviewed the triage vital signs and the nursing notes.   HISTORY  Chief Complaint Leg Pain    HPI [name] Holmes is a 81 y.o. female who presents to the ED from home via EMS with a chief complaint of right leg pain. Patient reports "gout pain 6 months". States right knee and ankle all remaining chronically swollen and complains of increased pain to her right lower leg tonight. Denies associated fever, chills, chest pain, shortness of breath, abdominal pain, nausea, vomiting, diarrhea. Ambulates with both cane and walker at home. Denies recent travel or trauma. Reports she has a daily aide who checks on her. Of note, the nurse discovered an unfilled prescription for Keflex which was prescribed on her last visit one week prior for UTI.   Past Medical History  Diagnosis Date  . Arthritis   . Hypertension   . Anemia of chronic renal failure   . Gout   . ETOH abuse   . CKD (chronic kidney disease)     Patient Active Problem List   Diagnosis Date Noted  . Melena 08/24/2015  . Anemia of chronic kidney failure 04/26/2015  . Pressure ulcer 12/06/2014  . Weakness 12/05/2014  . H/O ETOH abuse 12/05/2014  . HTN (hypertension) 12/05/2014  . Gout 12/05/2014  . Acute on chronic kidney failure ([place]) 11/03/2014  . UTI (lower urinary tract infection) 11/03/2014  . Anemia     Past Surgical History  Procedure Laterality Date  . Abdominal hysterectomy    . Esophagogastroduodenoscopy (egd) with propofol N/A 11/08/2014    Procedure: ESOPHAGOGASTRODUODENOSCOPY (EGD) WITH PROPOFOL;  Surgeon: Lollie Sails, MD;  Location: [facility] ENDOSCOPY;  Service: Endoscopy;  Laterality: N/A;  . Colonoscopy with propofol N/A 12/08/2014    Procedure: COLONOSCOPY WITH PROPOFOL;  Surgeon: Josefine Class, MD;  Location: [facility] ENDOSCOPY;  Service:  Endoscopy;  Laterality: N/A;  . Givens capsule study N/A 08/26/2015    Procedure: GIVENS CAPSULE STUDY;  Surgeon: Josefine Class, MD;  Location: [facility] ENDOSCOPY;  Service: Endoscopy;  Laterality: N/A;    Current Outpatient Rx  Name  Route  Sig  Dispense  Refill  . acetaminophen (TYLENOL) 325 MG tablet   Oral   Take 650 mg by mouth every 6 (six) hours as needed for mild pain or headache.          . cephALEXin (KEFLEX) 500 MG capsule   Oral   Take 1 capsule (500 mg total) by mouth 2 (two) times daily.   20 capsule   0   . cholecalciferol (VITAMIN D) 1000 units tablet   Oral   Take 1,000 Units by mouth daily.         . febuxostat (ULORIC) 40 MG tablet   Oral   Take 40 mg by mouth daily.         . ferrous sulfate (FERROUSUL) 325 (65 FE) MG tablet   Oral   Take 1 tablet (325 mg total) by mouth 2 (two) times daily with a meal.   60 tablet   0   . folic acid (FOLVITE) 1 MG tablet   Oral   Take 1 mg by mouth daily.         . furosemide (LASIX) 40 MG tablet   Oral   Take 40 mg by mouth every morning.          Marland Kitchen levothyroxine (SYNTHROID, LEVOTHROID) 50  MCG tablet   Oral   Take 50 mcg by mouth every morning.          . loperamide (IMODIUM A-D) 2 MG tablet   Oral   Take 2 mg by mouth 4 (four) times daily as needed for diarrhea or loose stools.         . magnesium oxide (MAG-OX) 400 MG tablet   Oral   Take 400 mg by mouth daily.         . metoprolol tartrate (LOPRESSOR) 25 MG tablet   Oral   Take 25 mg by mouth 2 (two) times daily.          . Multiple Vitamin (MULTIVITAMIN WITH MINERALS) TABS tablet   Oral   Take 1 tablet by mouth daily.         Marland Kitchen oxybutynin (DITROPAN-XL) 5 MG 24 hr tablet   Oral   Take 5 mg by mouth at bedtime.         . pantoprazole (PROTONIX) 40 MG tablet   Oral   Take 1 tablet (40 mg total) by mouth daily.   30 tablet   0   . polyethylene glycol (MIRALAX / GLYCOLAX) packet   Oral   Take 17 g by mouth daily as  needed for moderate constipation.          . potassium chloride (K-DUR,KLOR-CON) 10 MEQ tablet   Oral   Take 10 mEq by mouth daily.         Marland Kitchen thiamine 100 MG tablet   Oral   Take 100 mg by mouth daily.         . traZODone (DESYREL) 50 MG tablet   Oral   Take 50 mg by mouth at bedtime.            Allergies Review of patient's allergies indicates no known allergies.  Family History  Problem Relation Age of Onset  . Hypertension Father     Social History Social History  Substance Use Topics  . Smoking status: Former Smoker    Types: Cigarettes  . Smokeless tobacco: Never Used  . Alcohol Use: 7.2 oz/week    6 Cans of beer, 6 Shots of liquor per week     Comment: daily    Review of Systems  Constitutional: No fever/chills. Eyes: No visual changes. ENT: No sore throat. Cardiovascular: Denies chest pain. Respiratory: Denies shortness of breath. Gastrointestinal: No abdominal pain.  No nausea, no vomiting.  No diarrhea.  No constipation. Genitourinary: Negative for dysuria. Musculoskeletal: Positive for right leg pain. Negative for back pain. Skin: Negative for rash. Neurological: Negative for headaches, focal weakness or numbness.  10-point ROS otherwise negative.  ____________________________________________   PHYSICAL EXAM:  VITAL SIGNS: ED Triage Vitals  Enc Vitals Group     BP 10/13/15 2325 100/67 mmHg     Pulse Rate 10/13/15 2325 85     Resp 10/13/15 2325 20     Temp 10/13/15 2325 98.3 F (36.8 C)     Temp Source 10/13/15 2325 Oral     SpO2 10/13/15 2325 100 %     Weight 10/13/15 2325 140 lb (63.504 kg)     Height 10/13/15 2325 5\' 4"  (1.626 m)     Head Cir --      Peak Flow --      Pain Score 10/14/15 0344 10     Pain Loc --      Pain Edu? --      Excl. in  GC? --     Constitutional: Alert and oriented. Well appearing and in no acute distress. Eyes: Conjunctivae are normal. PERRL. EOMI. Head: Atraumatic. Nose: No  congestion/rhinnorhea. Mouth/Throat: Mucous membranes are moist.  Oropharynx non-erythematous. Neck: No stridor.   Cardiovascular: Normal rate, regular rhythm. Grossly normal heart sounds.  Good peripheral circulation. Respiratory: Normal respiratory effort.  No retractions. Lungs CTAB. Gastrointestinal: Soft and nontender. No distention. No abdominal bruits. No CVA tenderness. Musculoskeletal: Right knee effusion which is nontender to palpation and without warmth or erythema. Full range of motion of knee without pain. Mild tenderness to palpation above right malleolus. Joints without effusion, warmth or tenderness. Full range of motion without pain. Calf is supple without evidence for compartment syndrome. Limb is symmetrically warm without evidence for ischemia. 2+ distal pulses. Brisk, less than 5 second capillary refill. Neurologic:  Normal speech and language. No gross focal neurologic deficits are appreciated.  Skin:  Skin is warm, dry and intact. No rash noted. Psychiatric: Mood and affect are normal. Speech and behavior are normal.  ____________________________________________   LABS (all labs ordered are listed, but only abnormal results are displayed)  Labs Reviewed  CBC WITH DIFFERENTIAL/PLATELET - Abnormal; Notable for the following:    RBC 2.57 (*)    Hemoglobin 8.0 (*)    HCT 24.2 (*)    RDW 16.0 (*)    Platelets 141 (*)    All other components within normal limits  BASIC METABOLIC PANEL - Abnormal; Notable for the following:    Sodium 133 (*)    CO2 16 (*)    BUN 25 (*)    Creatinine, Ser 2.33 (*)    Calcium 8.0 (*)    GFR calc non Af Amer 19 (*)    GFR calc Af Amer 22 (*)    All other components within normal limits  URINE CULTURE  URINALYSIS COMPLETEWITH MICROSCOPIC ([facility] ONLY)  PROTIME-INR  TROPONIN I  TYPE AND SCREEN   ____________________________________________  EKG  ED ECG REPORT I, SUNG,JADE J, the attending physician, personally viewed and  interpreted this ECG.   Date: 10/14/2015  EKG Time: 0725  Rate: 94  Rhythm: normal EKG, normal sinus rhythm  Axis: Normal  Intervals:nonspecific intraventricular conduction delay  ST&T Change: Nonspecific  ____________________________________________  RADIOLOGY  None ____________________________________________   PROCEDURES  Procedure(s) performed: None   Critical Care performed: No  ____________________________________________   INITIAL IMPRESSION / ASSESSMENT AND PLAN / ED COURSE  Pertinent labs & imaging results that were available during my care of the patient were reviewed by me and considered in my medical decision making (see chart for details).  81 year old female with pain above right malleolus 6 months which she reports as her gout pain. Will check screening lab work as last month she had evidence of renal insufficiency; will also repeat urinalysis and reassess.  ----------------------------------------- 6:37 AM on 10/14/2015 -----------------------------------------  Noted anemia which is lower then previous. Rectal exam performed; patient has a diaper full of melanotic, foul smelling stool which is immediately heme positive. Given her history of EtOH abuse, I suspect upper GI bleeding. Will obtain type and screen and placed on Protonix drip. Discuss with hospitalist for evaluation in the emergency department for admission.  ----------------------------------------- 7:12 AM on 10/14/2015 -----------------------------------------  Noted capsule endoscopy reports performed by Dr. Rayann Heman on 08/26/2015 which demonstrated 2 angioectasias in distal small bowel; recommendations included considered device assisted retrograde enteroscopy for treatment of angioectasias for overt melena and large drop in hemoglobin. Patient placed on CIWA  protocol given her history of EtOH abuse. 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

## 2015-10-14 NOTE — H&P (Signed)
[facility] [place] NAME: [name] Holmes    MR#:  [id number]  DATE OF BIRTH:  Feb 15, 1935  DATE OF ADMISSION:  10/14/2015  PRIMARY CARE PHYSICIAN: Marguerita Merles, MD   REQUESTING/REFERRING PHYSICIAN: Paulette Blanch, MD  CHIEF COMPLAINT:   Chief Complaint  Patient presents with  . Leg Pain   right ankle and knee pain and swelling for 6 months with worsening pain last night.  HISTORY OF PRESENT ILLNESS:  [name] Holmes  is a 81 y.o. female with a known history of GI bleeding, anemia, gout, hypertension, CKD and alcohol abuse. The patient came to ED with complaints of increased pain to her right knee and ankle due to gout last night. She has had gout pain intermittently for the past 6 months. However, she was found to have melena and hemoglobin dropped from 9.5 to 8.0. She was recently admitted for GI bleeding and got 3 units of blood transfusion. She got a EGD and colonoscopy which were negative. She underwent capsule endoscopy which showed 2 angiectasias in distal small bowel. GI Dr. Rayann Heman consider device assistant retrograde enteroscopy for treatment of angiectasias for overt melena or large drop in hemoglobin. The patient has weakness and melena but denies any other symptoms.  PAST MEDICAL HISTORY:   Past Medical History  Diagnosis Date  . Arthritis   . Hypertension   . Anemia of chronic renal failure   . Gout   . ETOH abuse   . CKD (chronic kidney disease)     PAST SURGICAL HISTORY:   Past Surgical History  Procedure Laterality Date  . Abdominal hysterectomy    . Esophagogastroduodenoscopy (egd) with propofol N/A 11/08/2014    Procedure: ESOPHAGOGASTRODUODENOSCOPY (EGD) WITH PROPOFOL;  Surgeon: Lollie Sails, MD;  Location: [facility] ENDOSCOPY;  Service: Endoscopy;  Laterality: N/A;  . Colonoscopy with propofol N/A 12/08/2014    Procedure: COLONOSCOPY WITH PROPOFOL;  Surgeon: Josefine Class, MD;  Location: [facility] ENDOSCOPY;   Service: Endoscopy;  Laterality: N/A;  . Givens capsule study N/A 08/26/2015    Procedure: GIVENS CAPSULE STUDY;  Surgeon: Josefine Class, MD;  Location: [facility] ENDOSCOPY;  Service: Endoscopy;  Laterality: N/A;    SOCIAL HISTORY:   Social History  Substance Use Topics  . Smoking status: Former Smoker    Types: Cigarettes  . Smokeless tobacco: Never Used  . Alcohol Use: 7.2 oz/week    6 Cans of beer, 6 Shots of liquor per week     Comment: daily    FAMILY HISTORY:   Family History  Problem Relation Age of Onset  . Hypertension Father     DRUG ALLERGIES:  No Known Allergies  REVIEW OF SYSTEMS:  CONSTITUTIONAL: No fever, has generalized weakness.  EYES: No blurred or double vision.  EARS, NOSE, AND THROAT: No tinnitus or ear pain.  RESPIRATORY: No cough, shortness of breath, wheezing or hemoptysis.  CARDIOVASCULAR: No chest pain, orthopnea, edema.  GASTROINTESTINAL: No nausea, vomiting, diarrhea or abdominal pain. Has melena but no bloody stool. GENITOURINARY: No dysuria, hematuria.  ENDOCRINE: No polyuria, nocturia,  HEMATOLOGY: No anemia, easy bruising or bleeding SKIN: No rash or lesion. MUSCULOSKELETAL: Right ankle and knee pain. NEUROLOGIC: No tingling, numbness, weakness.  PSYCHIATRY: No anxiety or depression.   MEDICATIONS AT HOME:   Prior to Admission medications   Medication Sig Start Date End Date Taking? Authorizing Provider  acetaminophen (TYLENOL) 325 MG tablet Take 650 mg by mouth every 6 (six) hours as needed  for mild pain or headache.     Historical Provider, MD  cephALEXin (KEFLEX) 500 MG capsule Take 1 capsule (500 mg total) by mouth 2 (two) times daily. 10/08/15 10/18/15  Orbie Pyo, MD  cholecalciferol (VITAMIN D) 1000 units tablet Take 1,000 Units by mouth daily.    Historical Provider, MD  febuxostat (ULORIC) 40 MG tablet Take 40 mg by mouth daily.    Historical Provider, MD  ferrous sulfate (FERROUSUL) 325 (65 FE) MG tablet Take 1  tablet (325 mg total) by mouth 2 (two) times daily with a meal. 12/08/14   Dustin Flock, MD  folic acid (FOLVITE) 1 MG tablet Take 1 mg by mouth daily.    Historical Provider, MD  furosemide (LASIX) 40 MG tablet Take 40 mg by mouth every morning.     Historical Provider, MD  levothyroxine (SYNTHROID, LEVOTHROID) 50 MCG tablet Take 50 mcg by mouth every morning.     Historical Provider, MD  loperamide (IMODIUM A-D) 2 MG tablet Take 2 mg by mouth 4 (four) times daily as needed for diarrhea or loose stools.    Historical Provider, MD  magnesium oxide (MAG-OX) 400 MG tablet Take 400 mg by mouth daily.    Historical Provider, MD  metoprolol tartrate (LOPRESSOR) 25 MG tablet Take 25 mg by mouth 2 (two) times daily.     Historical Provider, MD  Multiple Vitamin (MULTIVITAMIN WITH MINERALS) TABS tablet Take 1 tablet by mouth daily.    Historical Provider, MD  oxybutynin (DITROPAN-XL) 5 MG 24 hr tablet Take 5 mg by mouth at bedtime.    Historical Provider, MD  pantoprazole (PROTONIX) 40 MG tablet Take 1 tablet (40 mg total) by mouth daily. 08/28/15   Dustin Flock, MD  polyethylene glycol (MIRALAX / GLYCOLAX) packet Take 17 g by mouth daily as needed for moderate constipation.     Historical Provider, MD  potassium chloride (K-DUR,KLOR-CON) 10 MEQ tablet Take 10 mEq by mouth daily.    Historical Provider, MD  thiamine 100 MG tablet Take 100 mg by mouth daily.    Historical Provider, MD  traZODone (DESYREL) 50 MG tablet Take 50 mg by mouth at bedtime.     Historical Provider, MD      VITAL SIGNS:  Blood pressure 126/88, pulse 97, temperature 97.6 F (36.4 C), temperature source Oral, resp. rate 16, height 5\' 4"  (1.626 m), weight 63.504 kg (140 lb), SpO2 100 %.  PHYSICAL EXAMINATION:  GENERAL:  81 y.o.-year-old patient lying in the bed with no acute distress.  EYES: Pupils equal, round, reactive to light and accommodation. No scleral icterus. Extraocular muscles intact.  HEENT: Head atraumatic,  normocephalic. Oropharynx and nasopharynx clear.  NECK:  Supple, no jugular venous distention. No thyroid enlargement, no tenderness.  LUNGS: Normal breath sounds bilaterally, no wheezing, rales,rhonchi or crepitation. No use of accessory muscles of respiration.  CARDIOVASCULAR: S1, S2 normal. No murmurs, rubs, or gallops.  ABDOMEN: Soft, nontender, nondistended. Bowel sounds present. No organomegaly or mass.  EXTREMITIES: No pedal edema, cyanosis, or clubbing. Bilateral ankle mild swelling, tenderness on right ankle. No erythema. NEUROLOGIC: Cranial nerves II through XII are intact. Muscle strength 4/5 in all extremities. Sensation intact. Gait not checked.  PSYCHIATRIC: The patient is alert and oriented x 3.  SKIN: No obvious rash, lesion, or ulcer.   LABORATORY PANEL:   CBC  Recent Labs Lab 10/14/15 0530  WBC 6.6  HGB 8.0*  HCT 24.2*  PLT 141*   ------------------------------------------------------------------------------------------------------------------  Chemistries   Recent  Labs Lab 10/14/15 0530  NA 133*  K 4.3  CL 108  CO2 16*  GLUCOSE 69  BUN 25*  CREATININE 2.33*  CALCIUM 8.0*   ------------------------------------------------------------------------------------------------------------------  Cardiac Enzymes  Recent Labs Lab 10/14/15 0530  TROPONINI <0.03   ------------------------------------------------------------------------------------------------------------------  RADIOLOGY:  No results found.  EKG:   Orders placed or performed during the hospital encounter of 10/14/15  . ED EKG  . ED EKG  . EKG 12-Lead  . EKG 12-Lead    IMPRESSION AND PLAN:   GI bleeding with melena Patient will be admitted to medical floor. She is going to receive 2 units PRBC transfusion. Keep nothing by mouth except medication and sips of water, Continue Protonix drip, IV fluids to support, Follow-up  hemoglobin and get GI consult.  Anemia. PRBC transfusion  and follow-up hemoglobin. Hyponatremia. Follow up BMP. Hypertension. Blood pressure is low, hold hypertension medication. CKD stage IV. Stable Alcohol abuse. Start CIWA protocol. History of gout. Check uric acid level. Pain control.   All the records are reviewed and case discussed with ED provider. Management plans discussed with the patient, family and they are in agreement.  CODE STATUS: Full code  TOTAL TIME TAKING CARE OF THIS PATIENT: 55 minutes.    [name] M.D on 10/14/2015 at 8:09 AM  Between 7am to 6pm - Pager - [phone]  After 6pm go to [email or web address] - password [email or web address]  Office  [phone]  CC: Primary care physician; Marguerita Merles, MD

## 2015-10-14 NOTE — ED Notes (Signed)
Pt reports gout pain X 6 months. Pt has non-pitting edema of the right knee and ankle. Pt reports pain of the lower right leg, midway between the knee and ankle described as stabbing.

## 2015-10-14 NOTE — ED Notes (Signed)
Pt informed urine sample is needed. Pt reported she is unable to provide sample at this time. Pt will inform nurse when she is able to provide sample

## 2015-10-14 NOTE — Clinical Social Work Note (Signed)
Clinical Social Work Assessment  Patient Details  Name: [name] Holmes MRN: [id number] Date of Birth: 05-Jan-1935  Date of referral:  10/14/15               Reason for consult:  Transportation                Permission sought to share information with:    Permission granted to share information::     Name::        Agency::     Relationship::     Contact Information:     Housing/Transportation Living arrangements for the past 2 months:  Single Family Home Source of Information:  Other (Comment Required) ([employer] development officer ) Patient Interpreter Needed:  None Criminal Activity/Legal Involvement Pertinent to Current Situation/Hospitalization:  No - Comment as needed Significant Relationships:  Other Family Members Lives with:  Self Do you feel safe going back to the place where you live?    Need for family participation in patient care:  Yes (Comment)  Care giving concerns:  Patient lives alone in [place].    Social Worker assessment / plan:  Holiday representative (CSW) received consult for "transportation." CSW is familiar with patient from previous admission. Patient was placed from the ED on 09/10/15 to [facility]. RN in the ED made an Adult Protective Services (APS) report due to self neglect. CSW contacted on call APS worker to see if APS is still following patient. Per on call APS worker CSW will have to call APS Monday to confirm if they are following patient. CSW attempted to meet with patient however she kept falling asleep. CSW contacted patient's niece [name] Holmes [phone]. Per niece patient has been at home for 2 weeks from [facility]. Per niece patient stayed 37 says at [facility] and was doing well enoguh to discharge home alone. Per niece patient's gout was bothering her at home. Niece reported that patient is only drinking a few beers a day. Per niece she is coming to visit patient at [facility] today. CSW will continue to follow and assist as needed.    Employment status:  Disabled (Comment on whether or not currently receiving Disability), Retired Nurse, adult PT Recommendations:  Not assessed at this time Information / Referral to community resources:     Patient/Family's Response to care:  Patient did not participate in assessment.   Patient/Family's Understanding of and Emotional Response to Diagnosis, Current Treatment, and Prognosis:  Patient's niece [name] Holmes was pleasant and thanked CSW for calling.   Emotional Assessment Appearance:  Appears stated age Attitude/Demeanor/Rapport:  Unable to Assess Affect (typically observed):  Unable to Assess Orientation:  Oriented to Self, Oriented to Place Alcohol / Substance use:  Alcohol Use Psych involvement (Current and /or in the community):  No (Comment)  Discharge Needs  Concerns to be addressed:  Discharge Planning Concerns Readmission within the last 30 days:  No Current discharge risk:  Chronically ill, Dependent with Mobility Barriers to Discharge:  Continued Medical Work up   Loralyn Freshwater, LCSW 10/14/2015, 2:09 PM

## 2015-10-14 NOTE — Progress Notes (Signed)
Notified Dr Bridgett Larsson of pt c/o itching during 2nd unit of PRBC; dr stated to give pt benadryl 25 mg IV PRN, and to continue transfusion

## 2015-10-15 DIAGNOSIS — K921 Melena: Secondary | ICD-10-CM | POA: Diagnosis not present

## 2015-10-15 LAB — TYPE AND SCREEN
ABO/RH(D): O POS
ANTIBODY SCREEN: NEGATIVE
UNIT DIVISION: 0
UNIT DIVISION: 0

## 2015-10-15 LAB — CBC
HCT: 29 % — ABNORMAL LOW (ref 35.0–47.0)
Hemoglobin: 9.5 g/dL — ABNORMAL LOW (ref 12.0–16.0)
MCH: 29.1 pg (ref 26.0–34.0)
MCHC: 32.8 g/dL (ref 32.0–36.0)
MCV: 88.8 fL (ref 80.0–100.0)
Platelets: 104 10*3/uL — ABNORMAL LOW (ref 150–440)
RBC: 3.26 MIL/uL — ABNORMAL LOW (ref 3.80–5.20)
RDW: 18.3 % — ABNORMAL HIGH (ref 11.5–14.5)
WBC: 4.8 10*3/uL (ref 3.6–11.0)

## 2015-10-15 LAB — BASIC METABOLIC PANEL
ANION GAP: 5 (ref 5–15)
BUN: 23 mg/dL — AB (ref 6–20)
CHLORIDE: 115 mmol/L — AB (ref 101–111)
CO2: 18 mmol/L — AB (ref 22–32)
Calcium: 7.7 mg/dL — ABNORMAL LOW (ref 8.9–10.3)
Creatinine, Ser: 2.17 mg/dL — ABNORMAL HIGH (ref 0.44–1.00)
GFR calc Af Amer: 24 mL/min — ABNORMAL LOW (ref >60)
GFR, EST NON AFRICAN AMERICAN: 21 mL/min — AB (ref >60)
GLUCOSE: 85 mg/dL (ref 65–99)
POTASSIUM: 5.1 mmol/L (ref 3.5–5.1)
Sodium: 138 mmol/L (ref 135–145)

## 2015-10-15 MED ORDER — PANTOPRAZOLE SODIUM 40 MG PO TBEC
40.0000 mg | DELAYED_RELEASE_TABLET | Freq: Two times a day (BID) | ORAL | Status: DC
  Administered 2015-10-15 – 2015-10-16 (×3): 40 mg via ORAL
  Filled 2015-10-15 (×3): qty 1

## 2015-10-15 NOTE — Consult Note (Signed)
GI Inpatient Consult Note  Reason for Consult: Melena   Attending Requesting Consult:  History of Present Illness: [name] Holmes is a 81 y.o. female with recurrent melena and anemia. Transfused overnight. Poor historian. Has had recurrent GI bleeding. Both EGD and colonoscopy done in 2016 failed to show causes of GI bleeding. Video capsule study by Dr. 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 So, the plan was pt to go to a tertiary center for enteroscopy. Pt not sure if any arrangements were made.  Past Medical History:  Past Medical History  Diagnosis Date  . Arthritis   . Hypertension   . Anemia of chronic renal failure   . Gout   . ETOH abuse   . CKD (chronic kidney disease)     Problem List: Patient Active Problem List   Diagnosis Date Noted  . GIB (gastrointestinal bleeding) 10/14/2015  . Melena 08/24/2015  . Anemia of chronic kidney failure 04/26/2015  . Pressure ulcer 12/06/2014  . Weakness 12/05/2014  . H/O ETOH abuse 12/05/2014  . HTN (hypertension) 12/05/2014  . Gout 12/05/2014  . Acute on chronic kidney failure ([place]) 11/03/2014  . UTI (lower urinary tract infection) 11/03/2014  . Anemia     Past Surgical History: Past Surgical History  Procedure Laterality Date  . Abdominal hysterectomy    . Esophagogastroduodenoscopy (egd) with propofol N/A 11/08/2014    Procedure: ESOPHAGOGASTRODUODENOSCOPY (EGD) WITH PROPOFOL;  Surgeon: Lollie Sails, MD;  Location: [facility] ENDOSCOPY;  Service: Endoscopy;  Laterality: N/A;  . Colonoscopy with propofol N/A 12/08/2014    Procedure: COLONOSCOPY WITH PROPOFOL;  Surgeon: Josefine Class, MD;  Location: [facility] ENDOSCOPY;  Service: Endoscopy;  Laterality: N/A;  . Givens capsule study N/A 08/26/2015    Procedure: GIVENS CAPSULE STUDY;  Surgeon: Josefine Class, MD;  Location: [facility] ENDOSCOPY;  Service: Endoscopy;  Laterality: N/A;    Allergies: No Known Allergies  Home  Medications: Prescriptions prior to admission  Medication Sig Dispense Refill Last Dose  . acetaminophen (TYLENOL) 325 MG tablet Take 650 mg by mouth every 6 (six) hours as needed for mild pain or headache.    prn at prn  . cephALEXin (KEFLEX) 500 MG capsule Take 1 capsule (500 mg total) by mouth 2 (two) times daily. 20 capsule 0   . cholecalciferol (VITAMIN D) 1000 units tablet Take 1,000 Units by mouth daily.   unknown at unknown  . febuxostat (ULORIC) 40 MG tablet Take 40 mg by mouth daily.   unknown at unknown  . ferrous sulfate (FERROUSUL) 325 (65 FE) MG tablet Take 1 tablet (325 mg total) by mouth 2 (two) times daily with a meal. 60 tablet 0 unknown at unknown  . folic acid (FOLVITE) 1 MG tablet Take 1 mg by mouth daily.   unknown at unknown  . furosemide (LASIX) 40 MG tablet Take 40 mg by mouth every morning.    unknown at unknown  . levothyroxine (SYNTHROID, LEVOTHROID) 50 MCG tablet Take 50 mcg by mouth every morning.    unknown at unknown  . loperamide (IMODIUM A-D) 2 MG tablet Take 2 mg by mouth 4 (four) times daily as needed for diarrhea or loose stools.   prn at prn  . magnesium oxide (MAG-OX) 400 MG tablet Take 400 mg by mouth daily.   unknown at unknown  . metoprolol tartrate (LOPRESSOR) 25 MG tablet Take 25 mg by mouth 2 (two) times daily.    unknown at unknown  .  Multiple Vitamin (MULTIVITAMIN WITH MINERALS) TABS tablet Take 1 tablet by mouth daily.   unknown at unknown  . oxybutynin (DITROPAN-XL) 5 MG 24 hr tablet Take 5 mg by mouth at bedtime.   unknown at unknown  . pantoprazole (PROTONIX) 40 MG tablet Take 1 tablet (40 mg total) by mouth daily. 30 tablet 0 unknown at unknown  . polyethylene glycol (MIRALAX / GLYCOLAX) packet Take 17 g by mouth daily as needed for moderate constipation.    prn at prn  . potassium chloride (K-DUR,KLOR-CON) 10 MEQ tablet Take 10 mEq by mouth daily.   unknown at unknown  . thiamine 100 MG tablet Take 100 mg by mouth daily.   unknown at unknown  .  traZODone (DESYREL) 50 MG tablet Take 50 mg by mouth at bedtime.    unknown at unknown   Home medication reconciliation was completed with the patient.   Scheduled Inpatient Medications:   . febuxostat  40 mg Oral Daily  . folic acid  1 mg Oral Daily  . levothyroxine  50 mcg Oral QAC breakfast  . LORazepam  0-4 mg Intravenous 4 times per day  . magnesium oxide  400 mg Oral Daily  . multivitamin with minerals  1 tablet Oral Daily  . oxybutynin  5 mg Oral QHS  . potassium chloride  10 mEq Oral Daily  . thiamine  100 mg Intravenous Daily  . traZODone  50 mg Oral QHS    Continuous Inpatient Infusions:   . sodium chloride 75 mL/hr at 10/14/15 1055  . pantoprozole (PROTONIX) infusion 8 mg/hr (10/14/15 2342)    PRN Inpatient Medications:  acetaminophen **OR** acetaminophen, albuterol, diphenhydrAMINE, ondansetron **OR** ondansetron (ZOFRAN) IV, oxyCODONE-acetaminophen  Family History: family history includes Hypertension in her father.  The patient's family history is negative for inflammatory bowel disorders, GI malignancy, or solid organ transplantation.  Social History:   reports that she has quit smoking. Her smoking use included Cigarettes. She has never used smokeless tobacco. She reports that she drinks about 7.2 oz of alcohol per week. She reports that she does not use illicit drugs. The patient denies ETOH, tobacco, or drug use.   Review of Systems: Constitutional: Weight is stable.  Eyes: No changes in vision. ENT: No oral lesions, sore throat.  GI: see HPI.  Heme/Lymph: No easy bruising.  CV: No chest pain.  GU: No hematuria.  Integumentary: No rashes.  Neuro: No headaches.  Psych: No depression/anxiety.  Endocrine: No heat/cold intolerance.  Allergic/Immunologic: No urticaria.  Resp: No cough, SOB.  Musculoskeletal: No joint swelling.    Physical Examination: BP 106/61 mmHg  Pulse 79  Temp(Src) 98.3 F (36.8 C) (Oral)  Resp 17  Ht 5\' 4"  (1.626 m)  Wt  63.504 kg (140 lb)  BMI 24.02 kg/m2  SpO2 100% Gen: NAD, alert and oriented x 4 HEENT: PEERLA, EOMI, Neck: supple, no JVD or thyromegaly Chest: CTA bilaterally, no wheezes, crackles, or other adventitious sounds CV: RRR, no m/g/c/r Abd: soft, NT, ND, +BS in all four quadrants; no HSM, guarding, ridigity, or rebound tenderness Ext: no edema, well perfused with 2+ pulses, Skin: no rash or lesions noted Lymph: no LAD  Data: Lab Results  Component Value Date   WBC 4.8 10/15/2015   HGB 9.5* 10/15/2015   HCT 29.0* 10/15/2015   MCV 88.8 10/15/2015   PLT 104* 10/15/2015    Recent Labs Lab 10/14/15 0921 10/14/15 1713 10/15/15 0112  HGB 7.2* 10.1* 9.5*   Lab Results  Component Value  Date   NA 138 10/15/2015   K 5.1 10/15/2015   CL 115* 10/15/2015   CO2 18* 10/15/2015   BUN 23* 10/15/2015   CREATININE 2.17* 10/15/2015   Lab Results  Component Value Date   ALT 31 09/09/2015   AST 63* 09/09/2015   ALKPHOS 60 09/09/2015   BILITOT 0.5 09/09/2015    Recent Labs Lab 10/14/15 0725  INR 1.33   Assessment/Plan: Ms. Raul is a 81 y.o. female with recurrent GI bleeding.   Recommendations: Full liquid diet today. Solids tomorrow if no significant drop in hgb. [place] for discharge by tomorrow if pt remains stable. Will discuss with Dr. Rayann Heman regarding further testing for pt.  Thank you for the consult. Please call with questions or concerns.  Drenda Sobecki, Lupita Dawn, MD

## 2015-10-15 NOTE — Progress Notes (Signed)
[name] [place] NAME: [name] Holmes    MR#:  [id number]  DATE OF BIRTH:  [birth year]  SUBJECTIVE:  CHIEF COMPLAINT:   Chief Complaint  Patient presents with  . Leg Pain   No complaint. REVIEW OF SYSTEMS:  CONSTITUTIONAL: No fever, fatigue or weakness.  EYES: No blurred or double vision.  EARS, NOSE, AND THROAT: No tinnitus or ear pain.  RESPIRATORY: No cough, shortness of breath, wheezing or hemoptysis.  CARDIOVASCULAR: No chest pain, orthopnea, edema.  GASTROINTESTINAL: No nausea, vomiting, diarrhea or abdominal pain.  GENITOURINARY: No dysuria, hematuria.  ENDOCRINE: No polyuria, nocturia,  HEMATOLOGY: No anemia, easy bruising or bleeding SKIN: No rash or lesion. MUSCULOSKELETAL: No joint pain or arthritis.   NEUROLOGIC: No tingling, numbness, weakness.  PSYCHIATRY: No anxiety or depression.   DRUG ALLERGIES:  No Known Allergies  VITALS:  Blood pressure 106/61, pulse 79, temperature 98.3 F (36.8 C), temperature source Oral, resp. rate 17, height 5\' 4"  (1.626 m), weight 63.504 kg (140 lb), SpO2 100 %.  PHYSICAL EXAMINATION:  GENERAL:  81 y.o.-year-old patient lying in the bed with no acute distress.  EYES: Pupils equal, round, reactive to light and accommodation. No scleral icterus. Extraocular muscles intact.  HEENT: Head atraumatic, normocephalic. Oropharynx and nasopharynx clear.  NECK:  Supple, no jugular venous distention. No thyroid enlargement, no tenderness.  LUNGS: Normal breath sounds bilaterally, no wheezing, rales,rhonchi or crepitation. No use of accessory muscles of respiration.  CARDIOVASCULAR: S1, S2 normal. No murmurs, rubs, or gallops.  ABDOMEN: Soft, nontender, nondistended. Bowel sounds present. No organomegaly or mass.  EXTREMITIES: No pedal edema, cyanosis, or clubbing.  NEUROLOGIC: Cranial nerves II through XII are intact. Muscle strength 5/5 in all extremities. Sensation intact. Gait not  checked.  PSYCHIATRIC: The patient is alert and oriented x 3.  SKIN: No obvious rash, lesion, or ulcer.    LABORATORY PANEL:   CBC  Recent Labs Lab 10/15/15 0112  WBC 4.8  HGB 9.5*  HCT 29.0*  PLT 104*   ------------------------------------------------------------------------------------------------------------------  Chemistries   Recent Labs Lab 10/15/15 0112  NA 138  K 5.1  CL 115*  CO2 18*  GLUCOSE 85  BUN 23*  CREATININE 2.17*  CALCIUM 7.7*   ------------------------------------------------------------------------------------------------------------------  Cardiac Enzymes  Recent Labs Lab 10/14/15 0530  TROPONINI <0.03   ------------------------------------------------------------------------------------------------------------------  RADIOLOGY:  No results found.  EKG:   Orders placed or performed during the hospital encounter of 10/14/15  . ED EKG  . ED EKG  . EKG 12-Lead  . EKG 12-Lead  . EKG 12-Lead  . EKG 12-Lead    ASSESSMENT AND PLAN:   GI bleeding with melena She received 2 units PRBC transfusion, treated with Protonix drip, IV fluids support,  change to po protonix, Follow-up hemoglobin Per Dr. Candace Cruise, Full liquid diet today. Solids tomorrow if no significant drop in hgb. [place] for discharge by tomorrow if pt remains stable.Anemia. PRBC transfusion and follow-up hemoglobin.  Hyponatremia. improved. Hypertension. Blood pressure is low, hold hypertension medication. CKD stage IV. Stable Alcohol abuse. on CIWA protocol. History of gout. normal uric acid level.  Discussed with Dr. Candace Cruise. All the records are reviewed and case discussed with Care Management/Social Workerr. Management plans discussed with the patient, family and they are in agreement.  CODE STATUS: full code.  TOTAL TIME TAKING CARE OF THIS PATIENT: 33 minutes.  Greater than 50% time was spent on coordination of care and face-to-face counseling.  POSSIBLE D/C IN 1-2  DAYS,  DEPENDING ON CLINICAL CONDITION.   [name] M.D on 10/15/2015 at 1:12 PM  Between 7am to 6pm - Pager - [phone]  After 6pm go to [email or web address] - password [email or web address] [place] [facility]  Office  [phone]  CC: Primary care physician; Marguerita Merles, MD

## 2015-10-16 LAB — CBC
HEMATOCRIT: 27.5 % — AB (ref 35.0–47.0)
HEMATOCRIT: 31.4 % — AB (ref 35.0–47.0)
HEMOGLOBIN: 8.9 g/dL — AB (ref 12.0–16.0)
HEMOGLOBIN: 10.3 g/dL — AB (ref 12.0–16.0)
MCH: 29.3 pg (ref 26.0–34.0)
MCH: 29.4 pg (ref 26.0–34.0)
MCHC: 32.3 g/dL (ref 32.0–36.0)
MCHC: 32.8 g/dL (ref 32.0–36.0)
MCV: 90.8 fL (ref 80.0–100.0)
MCV: 89.8 fL (ref 80.0–100.0)
Platelets: 96 10*3/uL — ABNORMAL LOW (ref 150–440)
Platelets: 116 10*3/uL — ABNORMAL LOW (ref 150–440)
RBC: 3.03 MIL/uL — AB (ref 3.80–5.20)
RBC: 3.5 MIL/uL — AB (ref 3.80–5.20)
RDW: 18.3 % — ABNORMAL HIGH (ref 11.5–14.5)
RDW: 18.3 % — AB (ref 11.5–14.5)
WBC: 4.9 10*3/uL (ref 3.6–11.0)
WBC: 5.3 10*3/uL (ref 3.6–11.0)

## 2015-10-16 LAB — BASIC METABOLIC PANEL
ANION GAP: 2 — AB (ref 5–15)
ANION GAP: 2 — AB (ref 5–15)
BUN: 25 mg/dL — ABNORMAL HIGH (ref 6–20)
BUN: 27 mg/dL — ABNORMAL HIGH (ref 6–20)
CALCIUM: 7.3 mg/dL — AB (ref 8.9–10.3)
CHLORIDE: 116 mmol/L — AB (ref 101–111)
CO2: 15 mmol/L — ABNORMAL LOW (ref 22–32)
CO2: 19 mmol/L — ABNORMAL LOW (ref 22–32)
Calcium: 8.1 mg/dL — ABNORMAL LOW (ref 8.9–10.3)
Chloride: 118 mmol/L — ABNORMAL HIGH (ref 101–111)
Creatinine, Ser: 2.32 mg/dL — ABNORMAL HIGH (ref 0.44–1.00)
Creatinine, Ser: 2.54 mg/dL — ABNORMAL HIGH (ref 0.44–1.00)
GFR, EST AFRICAN AMERICAN: 22 mL/min — AB (ref >60)
GFR, EST AFRICAN AMERICAN: 20 mL/min — AB (ref >60)
GFR, EST NON AFRICAN AMERICAN: 19 mL/min — AB (ref >60)
GFR, EST NON AFRICAN AMERICAN: 17 mL/min — AB (ref >60)
Glucose, Bld: 67 mg/dL (ref 65–99)
Glucose, Bld: 92 mg/dL (ref 65–99)
POTASSIUM: 5.4 mmol/L — AB (ref 3.5–5.1)
POTASSIUM: 4.7 mmol/L (ref 3.5–5.1)
SODIUM: 137 mmol/L (ref 135–145)
Sodium: 135 mmol/L (ref 135–145)

## 2015-10-16 LAB — MAGNESIUM: MAGNESIUM: 1.5 mg/dL — AB (ref 1.7–2.4)

## 2015-10-16 MED ORDER — MAGNESIUM SULFATE 2 GM/50ML IV SOLN
2.0000 g | Freq: Once | INTRAVENOUS | Status: AC
  Administered 2015-10-16: 2 g via INTRAVENOUS
  Filled 2015-10-16: qty 50

## 2015-10-16 MED ORDER — SODIUM POLYSTYRENE SULFONATE 15 GM/60ML PO SUSP
30.0000 g | Freq: Once | ORAL | Status: AC
  Administered 2015-10-16: 30 g via ORAL
  Filled 2015-10-16: qty 120

## 2015-10-16 NOTE — Consult Note (Signed)
  Informed pt of our plans for referral to [facility]. Pt on solids. Will sign off.  Thanks.

## 2015-10-16 NOTE — Consult Note (Signed)
  Discussed pt with Dr. Rayann Heman, who recommended referral to [facility] for double balloon enteroscopy or Dr. Malissa Hippo at [facility] for retrograde spirus enteroscopy, which may be difficult  Technique to do.

## 2015-10-16 NOTE — Evaluation (Signed)
Physical Therapy Evaluation Patient Details Name: [name] Holmes MRN: [id number] DOB: 1934/11/30 Today's Date: 10/16/2015   History of Present Illness  Pt admitted for GI bleed requiring multiple transfusions during hospital stay. Pt with negative EGD and colonscopy. Pt also has complaints of R leg pain from recent fall at home. Pt with history of anemia, gout, HTN, and alcohol abuse.  Clinical Impression  Pt is a pleasant 81 year old female who was admitted for GI bleed. Pt performs bed mobility, transfers, and ambulation with cga and rw. Pt complains of being very cold and wants copious amount of blankets donned s/p ambulation. Pt demonstrates deficits with strength/endurance/mobility. Pt demonstrates safe ambulation using rw for mobility. Recommend continued use in home environment to prevent falls. Would benefit from skilled PT to address above deficits and promote optimal return to PLOF. Recommend transition to [facility] upon discharge from acute hospitalization.       Follow Up Recommendations Home health PT    Equipment Recommendations       Recommendations for Other Services       Precautions / Restrictions Precautions Precautions: Fall Restrictions Weight Bearing Restrictions: No      Mobility  Bed Mobility Overal bed mobility: Needs Assistance Bed Mobility: Supine to Sit     Supine to sit: Min guard     General bed mobility comments: asssist for bed mobility including cues to scoot towards EOB. Once seated at EOB, pt able to maintain upright posture with no LOB noted  Transfers Overall transfer level: Needs assistance Equipment used: Rolling walker (2 wheeled) Transfers: Sit to/from Stand Sit to Stand: Min guard         General transfer comment: transfers performed using RW. Safe technique performed.  Ambulation/Gait Ambulation/Gait assistance: Min guard Ambulation Distance (Feet): 20 Feet Assistive device: Rolling walker (2 wheeled) Gait  Pattern/deviations: Step-to pattern     General Gait Details: ambulated in room to recliner using rw. Safe technique with pt able to avoid obstacles safely. Pt reports she is very cold, limiting distance to in-room. Pt ambulates with sighty kyphotic posture.  Stairs            Wheelchair Mobility    Modified Rankin (Stroke Patients Only)       Balance Overall balance assessment: History of Falls;Needs assistance Sitting-balance support: Feet supported Sitting balance-Leahy Scale: Good     Standing balance support: Bilateral upper extremity supported Standing balance-Leahy Scale: Good                               Pertinent Vitals/Pain Pain Assessment: No/denies pain    Home Living Family/patient expects to be discharged to:: Private residence Living Arrangements: Alone Available Help at Discharge: Personal care attendant;Family Type of Home: Apartment Home Access: Level entry     Home Layout: One level Home Equipment: [place] - 2 wheels;Cane - quad;Grab bars - toilet;Shower seat Additional Comments: reports she has an aide that comes "daily" and "when I need her" to assist with meal prep and household activities    Prior Function Level of Independence: Needs assistance   Gait / Transfers Assistance Needed: RW for household mobilization  ADL's / Homemaking Assistance Needed: has CNA daily to help with ADLs        Hand Dominance        Extremity/Trunk Assessment   Upper Extremity Assessment: Generalized weakness (grossly 3+/5)           Lower  Extremity Assessment: Generalized weakness (grossly 4/5)         Communication   Communication: No difficulties  Cognition Arousal/Alertness: Awake/alert (sleepy) Behavior During Therapy: Flat affect Overall Cognitive Status: Difficult to assess                      General Comments      Exercises Other Exercises Other Exercises: seated ther-ex performed including B LE alt. LAQ,  hip abd/add, and hip add pillow squeezes. All ther-ex performed x 10 reps with cga and cues for correct technique.      Assessment/Plan    PT Assessment Patient needs continued PT services  PT Diagnosis Difficulty walking;Generalized weakness   PT Problem List Decreased strength;Decreased activity tolerance;Decreased mobility  PT Treatment Interventions Gait training;Therapeutic exercise;DME instruction   PT Goals (Current goals can be found in the Care Plan section) Acute Rehab PT Goals Patient Stated Goal: to go home PT Goal Formulation: With patient Time For Goal Achievement: 10/30/15 Potential to Achieve Goals: Good    Frequency Min 2X/week   Barriers to discharge        Co-evaluation               End of Session Equipment Utilized During Treatment: Gait belt Activity Tolerance: Patient tolerated treatment well Patient left: in chair;with chair alarm set Nurse Communication: Mobility status         Time: [id number] PT Time Calculation (min) (ACUTE ONLY): 20 min   Charges:   PT Evaluation $PT Eval Moderate Complexity: 1 Procedure PT Treatments $Therapeutic Exercise: 8-22 mins   PT G Codes:        Coleson Kant 10/26/2015, 11:30 AM  Greggory Stallion, PT, DPT [phone]

## 2015-10-16 NOTE — Discharge Instructions (Signed)
Heart healthy diet. Activity as tolerated. HHPT. Follow up Dr. Rayann Heman for referral to [facility] for double balloon enteroscopy or Dr. Malissa Hippo at [facility] for retrograde spirus enteroscopy.

## 2015-10-16 NOTE — Clinical Social Work Note (Signed)
Patient to discharge home today. [facility] of [facility] contacted today however they are closed for Easter Monday and thus CSW cannot verify if patient is currently opened. RN CM is adding home health and a Education officer, museum to follow with home health. Shela Leff MSW,LCSW [phone]

## 2015-10-16 NOTE — Discharge Summary (Signed)
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 Arthritis   . Hypertension   . Anemia of chronic renal failure   . Gout   . ETOH abuse   . CKD (chronic kidney disease)     HOSPITAL COURSE:   GI bleeding with melena She received 2 units PRBC transfusion, treated with Protonix drip, IV fluids support, changed to po protonix, stable hemoglobin She tolerated Solids today. Per Dr. Candace Cruise, referral to [facility] for enteroscopy.   Hyperkalemia. Po potassium was discontinued yesterday, given kayexalate and improved.  Hyponatremia. improved. Hypertension. Blood pressure is low, hold hypertension medication. CKD stage IV. Stable Alcohol abuse. on CIWA protocol. History of gout. normal uric acid level.  DISCHARGE CONDITIONS:   Stable, discharge to home with HHPT today.  CONSULTS OBTAINED:  Treatment Team:  Josefine Class, MD  DRUG ALLERGIES:  No Known Allergies  DISCHARGE MEDICATIONS:   Current Discharge Medication List    CONTINUE these medications which have NOT CHANGED   Details  acetaminophen (TYLENOL) 325 MG tablet Take 650 mg by mouth every 6 (six) hours as needed for mild pain or headache.     cephALEXin (KEFLEX) 500 MG capsule Take 1 capsule (500 mg total) by mouth 2 (two) times daily. Qty: 20 capsule,  Refills: 0    cholecalciferol (VITAMIN D) 1000 units tablet Take 1,000 Units by mouth daily.    febuxostat (ULORIC) 40 MG tablet Take 40 mg by mouth daily.    ferrous sulfate (FERROUSUL) 325 (65 FE) MG tablet Take 1 tablet (325 mg total) by mouth 2 (two) times daily with a meal. Qty: 60 tablet, Refills: 0    folic acid (FOLVITE) 1 MG tablet Take 1 mg by mouth daily.    levothyroxine (SYNTHROID, LEVOTHROID) 50 MCG tablet Take 50 mcg by mouth every morning.     loperamide (IMODIUM A-D) 2 MG tablet Take 2 mg by mouth 4 (four) times daily as needed for diarrhea or loose stools.    magnesium oxide (MAG-OX) 400 MG tablet Take 400 mg by mouth daily.    Multiple Vitamin (MULTIVITAMIN WITH MINERALS) TABS tablet Take 1 tablet by mouth daily.    oxybutynin (DITROPAN-XL) 5 MG 24 hr tablet Take 5 mg by mouth at bedtime.    pantoprazole (PROTONIX) 40 MG tablet Take 1 tablet (40 mg total) by mouth daily. Qty: 30 tablet, Refills: 0    polyethylene glycol (MIRALAX / GLYCOLAX) packet Take 17 g by mouth daily as needed for moderate constipation.     potassium chloride (K-DUR,KLOR-CON) 10 MEQ tablet Take 10 mEq by mouth daily.    thiamine 100 MG tablet Take 100 mg by mouth daily.    traZODone (DESYREL) 50 MG tablet Take 50 mg by mouth at bedtime.       STOP taking these medications     furosemide (LASIX) 40 MG tablet      metoprolol tartrate (  LOPRESSOR) 25 MG tablet          DISCHARGE INSTRUCTIONS:    If you experience worsening of your admission symptoms, develop shortness of breath, life threatening emergency, suicidal or homicidal thoughts you must seek medical attention immediately by calling 911 or calling your MD immediately  if symptoms less severe.  You Must read complete instructions/literature along with all the possible adverse reactions/side effects for all the Medicines you take and that have been prescribed to you. Take any new Medicines after you have completely understood  and accept all the possible adverse reactions/side effects.   Please note  You were cared for by a hospitalist during your hospital stay. If you have any questions about your discharge medications or the care you received while you were in the hospital after you are discharged, you can call the unit and asked to speak with the hospitalist on call if the hospitalist that took care of you is not available. Once you are discharged, your primary care physician will handle any further medical issues. Please note that NO REFILLS for any discharge medications will be authorized once you are discharged, as it is imperative that you return to your primary care physician (or establish a relationship with a primary care physician if you do not have one) for your aftercare needs so that they can reassess your need for medications and monitor your lab values.    Today   SUBJECTIVE    No complaint.  VITAL SIGNS:  Blood pressure 118/68, pulse 77, temperature 98.4 F (36.9 C), temperature source Oral, resp. rate 16, height 5\' 4"  (1.626 m), weight 63.504 kg (140 lb), SpO2 100 %.  I/O:   Intake/Output Summary (Last 24 hours) at 10/16/15 1404 Last data filed at 10/16/15 0900  Gross per 24 hour  Intake     50 ml  Output      0 ml  Net     50 ml    PHYSICAL EXAMINATION:  GENERAL:  81 y.o.-year-old patient lying in the bed with no acute distress.  EYES: Pupils equal, round, reactive to light and accommodation. No scleral icterus. Extraocular muscles intact.  HEENT: Head atraumatic, normocephalic. Oropharynx and nasopharynx clear.  NECK:  Supple, no jugular venous distention. No thyroid enlargement, no tenderness.  LUNGS: Normal breath sounds bilaterally, no wheezing, rales,rhonchi or crepitation. No use of accessory muscles of respiration.  CARDIOVASCULAR: S1, S2 normal. No murmurs, rubs, or gallops.  ABDOMEN: Soft, non-tender, non-distended. Bowel sounds present. No organomegaly or mass.   EXTREMITIES: No pedal edema, cyanosis, or clubbing.  NEUROLOGIC: Cranial nerves II through XII are intact. Muscle strength 4/5 in all extremities. Sensation intact. Gait not checked.  PSYCHIATRIC: The patient is alert and oriented x 3.  SKIN: No obvious rash, lesion, or ulcer.   DATA REVIEW:   CBC  Recent Labs Lab 10/16/15 1247  WBC 5.3  HGB 10.3*  HCT 31.4*  PLT 116*    Chemistries   Recent Labs Lab 10/16/15 0601 10/16/15 1247  NA 135 137  K 5.4* 4.7  CL 118* 116*  CO2 15* 19*  GLUCOSE 67 92  BUN 25* 27*  CREATININE 2.32* 2.54*  CALCIUM 7.3* 8.1*  MG 1.5*  --     Cardiac Enzymes  Recent Labs Lab 10/14/15 0530  TROPONINI <0.03    Microbiology Results  Results for orders placed or performed during the hospital encounter of 08/24/15  C difficile quick scan w PCR reflex     Status: None  Collection Time: 08/25/15  6:11 PM  Result Value Ref Range Status   C Diff antigen NEGATIVE NEGATIVE Final   C Diff toxin NEGATIVE NEGATIVE Final   C Diff interpretation Negative for C. difficile  Final    RADIOLOGY:  No results found.      Management plans discussed with the patient, family and they are in agreement.  CODE STATUS:     Code Status Orders        Start     Ordered   10/14/15 0849  Full code   Continuous     10/14/15 0848    Code Status History    Date Active Date Inactive Code Status Order ID Comments User Context   08/24/2015 11:18 PM 08/28/2015  7:38 PM Full Code [id number]  Hillary Bow, MD ED   12/05/2014  8:51 PM 12/08/2014 10:13 PM Full Code [id number]  Dustin Flock, MD Inpatient   11/03/2014  9:43 AM 11/08/2014 11:46 PM Full Code [id number]  Harrie Foreman, MD Inpatient      TOTAL TIME TAKING CARE OF THIS PATIENT: 36 minutes.    [name] M.D on 10/16/2015 at 2:04 PM  Between 7am to 6pm - Pager - [phone]  After 6pm go to [email or web address] - password [email or web address] [facility]  Office  [phone]  CC: Primary  care physician; Marguerita Merles, MD

## 2015-10-16 NOTE — Care Management Important Message (Signed)
Important Message  Patient Details  Name: [name] Holmes MRN: [id number] Date of Birth: 12/22/1934   Medicare Important Message Given:  Yes    Juliann Pulse A Khoen Genet 10/16/2015, 1:30 PM

## 2015-10-16 NOTE — Care Management (Signed)
Patient presents with GI bleeding with melena.  Patient lives at home alone.  States that she has a daughter who lives 1 street over for support.  Patient states that she has aid services provided by [employer] 3 hours a day.  Patient states that she has a walker and a cane in the home.  Patient denies any financial concerns, and states that her daughter provides transportation.  PT has assessed patient and recommend home health PT.  Home health PT, RN and SW, ordered.  Patient does not have a preference of agencies.  Patient has been previously open with [facility].  Corene Cornea with [facility] notified of referral.  RNCM signing off.

## 2015-10-16 NOTE — Care Management Important Message (Signed)
Important Message  Patient Details  Name: [name] Holmes MRN: [id number] Date of Birth: March 20, 1935   Medicare Important Message Given:  Yes    Juliann Pulse A [place] Fant 10/16/2015, 11:43 AM

## 2015-11-14 ENCOUNTER — Encounter: Payer: Self-pay | Admitting: Emergency Medicine

## 2015-11-14 ENCOUNTER — Inpatient Hospital Stay
Admission: EM | Admit: 2015-11-14 | Discharge: 2015-11-16 | DRG: 811 | Disposition: A | Discharge status: Home Health | Payer: Medicare Other | Attending: Internal Medicine | Admitting: Internal Medicine

## 2015-11-14 ENCOUNTER — Emergency Department: Payer: Medicare Other

## 2015-11-14 DIAGNOSIS — K921 Melena: Secondary | ICD-10-CM | POA: Diagnosis present

## 2015-11-14 DIAGNOSIS — B9620 Unspecified Escherichia coli [E. coli] as the cause of diseases classified elsewhere: Secondary | ICD-10-CM | POA: Diagnosis present

## 2015-11-14 DIAGNOSIS — M1990 Unspecified osteoarthritis, unspecified site: Secondary | ICD-10-CM | POA: Diagnosis present

## 2015-11-14 DIAGNOSIS — Z87891 Personal history of nicotine dependence: Secondary | ICD-10-CM | POA: Diagnosis not present

## 2015-11-14 DIAGNOSIS — I129 Hypertensive chronic kidney disease with stage 1 through stage 4 chronic kidney disease, or unspecified chronic kidney disease: Secondary | ICD-10-CM | POA: Diagnosis present

## 2015-11-14 DIAGNOSIS — M109 Gout, unspecified: Secondary | ICD-10-CM | POA: Diagnosis present

## 2015-11-14 DIAGNOSIS — E43 Unspecified severe protein-calorie malnutrition: Secondary | ICD-10-CM | POA: Diagnosis present

## 2015-11-14 DIAGNOSIS — D696 Thrombocytopenia, unspecified: Secondary | ICD-10-CM | POA: Diagnosis present

## 2015-11-14 DIAGNOSIS — F1020 Alcohol dependence, uncomplicated: Secondary | ICD-10-CM | POA: Diagnosis present

## 2015-11-14 DIAGNOSIS — D62 Acute posthemorrhagic anemia: Secondary | ICD-10-CM | POA: Diagnosis present

## 2015-11-14 DIAGNOSIS — N184 Chronic kidney disease, stage 4 (severe): Secondary | ICD-10-CM | POA: Diagnosis present

## 2015-11-14 DIAGNOSIS — D631 Anemia in chronic kidney disease: Secondary | ICD-10-CM | POA: Diagnosis present

## 2015-11-14 DIAGNOSIS — R770 Abnormality of albumin: Secondary | ICD-10-CM

## 2015-11-14 DIAGNOSIS — D500 Iron deficiency anemia secondary to blood loss (chronic): Secondary | ICD-10-CM | POA: Diagnosis present

## 2015-11-14 DIAGNOSIS — Z79899 Other long term (current) drug therapy: Secondary | ICD-10-CM | POA: Diagnosis not present

## 2015-11-14 DIAGNOSIS — Z8249 Family history of ischemic heart disease and other diseases of the circulatory system: Secondary | ICD-10-CM

## 2015-11-14 DIAGNOSIS — K922 Gastrointestinal hemorrhage, unspecified: Secondary | ICD-10-CM | POA: Diagnosis present

## 2015-11-14 DIAGNOSIS — K769 Liver disease, unspecified: Secondary | ICD-10-CM | POA: Diagnosis present

## 2015-11-14 DIAGNOSIS — E039 Hypothyroidism, unspecified: Secondary | ICD-10-CM | POA: Diagnosis present

## 2015-11-14 DIAGNOSIS — N390 Urinary tract infection, site not specified: Secondary | ICD-10-CM | POA: Diagnosis present

## 2015-11-14 DIAGNOSIS — N179 Acute kidney failure, unspecified: Secondary | ICD-10-CM | POA: Diagnosis present

## 2015-11-14 DIAGNOSIS — R4182 Altered mental status, unspecified: Secondary | ICD-10-CM | POA: Diagnosis present

## 2015-11-14 DIAGNOSIS — D649 Anemia, unspecified: Secondary | ICD-10-CM

## 2015-11-14 DIAGNOSIS — Z515 Encounter for palliative care: Secondary | ICD-10-CM | POA: Diagnosis not present

## 2015-11-14 LAB — COMPREHENSIVE METABOLIC PANEL
ALBUMIN: 2.1 g/dL — AB (ref 3.5–5.0)
ALK PHOS: 53 U/L (ref 38–126)
ALT: 21 U/L (ref 14–54)
ANION GAP: 6 (ref 5–15)
AST: 34 U/L (ref 15–41)
BILIRUBIN TOTAL: 0.6 mg/dL (ref 0.3–1.2)
BUN: 40 mg/dL — AB (ref 6–20)
CALCIUM: 8.6 mg/dL — AB (ref 8.9–10.3)
CO2: 22 mmol/L (ref 22–32)
Chloride: 105 mmol/L (ref 101–111)
Creatinine, Ser: 3.17 mg/dL — ABNORMAL HIGH (ref 0.44–1.00)
GFR calc Af Amer: 15 mL/min — ABNORMAL LOW (ref >60)
GFR calc non Af Amer: 13 mL/min — ABNORMAL LOW (ref >60)
GLUCOSE: 98 mg/dL (ref 65–99)
Potassium: 4.1 mmol/L (ref 3.5–5.1)
Sodium: 133 mmol/L — ABNORMAL LOW (ref 135–145)
Total Protein: 6.2 g/dL — ABNORMAL LOW (ref 6.5–8.1)

## 2015-11-14 LAB — CBC WITH DIFFERENTIAL/PLATELET
Basophils Absolute: 0 10*3/uL (ref 0–0.1)
Basophils Relative: 0 %
Eosinophils Absolute: 0.1 10*3/uL (ref 0–0.7)
Eosinophils Relative: 2 %
HEMATOCRIT: 20 % — AB (ref 35.0–47.0)
HEMOGLOBIN: 6.6 g/dL — AB (ref 12.0–16.0)
LYMPHS ABS: 1.3 10*3/uL (ref 1.0–3.6)
MCH: 28.5 pg (ref 26.0–34.0)
MCHC: 32.7 g/dL (ref 32.0–36.0)
MCV: 87 fL (ref 80.0–100.0)
Monocytes Absolute: 0.4 10*3/uL (ref 0.2–0.9)
NEUTROS ABS: 4.7 10*3/uL (ref 1.4–6.5)
Platelets: 80 10*3/uL — ABNORMAL LOW (ref 150–440)
RBC: 2.3 MIL/uL — ABNORMAL LOW (ref 3.80–5.20)
RDW: 18 % — ABNORMAL HIGH (ref 11.5–14.5)
WBC: 6.5 10*3/uL (ref 3.6–11.0)

## 2015-11-14 LAB — URINALYSIS COMPLETE WITH MICROSCOPIC (ARMC ONLY)
Bilirubin Urine: NEGATIVE
GLUCOSE, UA: NEGATIVE mg/dL
NITRITE: NEGATIVE
Protein, ur: 100 mg/dL — AB
SPECIFIC GRAVITY, URINE: 1.01 (ref 1.005–1.030)
pH: 6 (ref 5.0–8.0)

## 2015-11-14 LAB — BLOOD GAS, VENOUS
Acid-Base Excess: 0.5 mmol/L (ref 0.0–3.0)
BICARBONATE: 26.4 meq/L (ref 21.0–28.0)
PCO2 VEN: 49 mmHg (ref 44.0–60.0)
PH VEN: 7.34 (ref 7.320–7.430)
Patient temperature: 37

## 2015-11-14 LAB — PROTIME-INR
INR: 1.31
PROTHROMBIN TIME: 16.4 s — AB (ref 11.4–15.0)

## 2015-11-14 LAB — PREPARE RBC (CROSSMATCH)

## 2015-11-14 LAB — TSH: TSH: 0.863 u[IU]/mL (ref 0.350–4.500)

## 2015-11-14 LAB — ETHANOL

## 2015-11-14 LAB — TROPONIN I

## 2015-11-14 LAB — APTT: aPTT: 30 seconds (ref 24–36)

## 2015-11-14 MED ORDER — VITAMIN B-1 100 MG PO TABS
100.0000 mg | ORAL_TABLET | Freq: Every day | ORAL | Status: DC
  Administered 2015-11-14 – 2015-11-16 (×3): 100 mg via ORAL
  Filled 2015-11-14 (×3): qty 1

## 2015-11-14 MED ORDER — ALLOPURINOL 100 MG PO TABS
100.0000 mg | ORAL_TABLET | Freq: Every day | ORAL | Status: DC
  Administered 2015-11-14 – 2015-11-16 (×3): 100 mg via ORAL
  Filled 2015-11-14 (×3): qty 1

## 2015-11-14 MED ORDER — VITAMIN D 1000 UNITS PO TABS
1000.0000 [IU] | ORAL_TABLET | Freq: Every day | ORAL | Status: DC
  Administered 2015-11-14 – 2015-11-16 (×3): 1000 [IU] via ORAL
  Filled 2015-11-14 (×3): qty 1

## 2015-11-14 MED ORDER — MAGNESIUM OXIDE 400 (241.3 MG) MG PO TABS
400.0000 mg | ORAL_TABLET | Freq: Every day | ORAL | Status: DC
  Administered 2015-11-14 – 2015-11-16 (×3): 400 mg via ORAL
  Filled 2015-11-14 (×3): qty 1

## 2015-11-14 MED ORDER — SODIUM CHLORIDE 0.9 % IV SOLN
INTRAVENOUS | Status: DC
  Administered 2015-11-15: 03:00:00 via INTRAVENOUS

## 2015-11-14 MED ORDER — DEXTROSE 5 % IV SOLN
1.0000 g | INTRAVENOUS | Status: DC
  Administered 2015-11-15 – 2015-11-16 (×2): 1 g via INTRAVENOUS
  Filled 2015-11-14 (×4): qty 10

## 2015-11-14 MED ORDER — ADULT MULTIVITAMIN W/MINERALS CH
1.0000 | ORAL_TABLET | Freq: Every day | ORAL | Status: DC
  Administered 2015-11-14 – 2015-11-16 (×3): 1 via ORAL
  Filled 2015-11-14 (×3): qty 1

## 2015-11-14 MED ORDER — OXYBUTYNIN CHLORIDE ER 5 MG PO TB24
5.0000 mg | ORAL_TABLET | Freq: Every day | ORAL | Status: DC
Start: 2015-11-14 — End: 2015-11-16
  Administered 2015-11-14 – 2015-11-15 (×2): 5 mg via ORAL
  Filled 2015-11-14 (×2): qty 1

## 2015-11-14 MED ORDER — LEVOTHYROXINE SODIUM 50 MCG PO TABS
50.0000 ug | ORAL_TABLET | Freq: Every day | ORAL | Status: DC
  Administered 2015-11-15 – 2015-11-16 (×2): 50 ug via ORAL
  Filled 2015-11-14 (×2): qty 1

## 2015-11-14 MED ORDER — SODIUM CHLORIDE 0.9 % IV SOLN
80.0000 mg | Freq: Once | INTRAVENOUS | Status: AC
  Administered 2015-11-14: 22:00:00 80 mg via INTRAVENOUS
  Filled 2015-11-14 (×2): qty 80

## 2015-11-14 MED ORDER — SODIUM CHLORIDE 0.9 % IV BOLUS (SEPSIS)
500.0000 mL | Freq: Once | INTRAVENOUS | Status: AC
  Administered 2015-11-14: 500 mL via INTRAVENOUS

## 2015-11-14 MED ORDER — PANTOPRAZOLE SODIUM 40 MG IV SOLR: 40.0000 mg | Freq: Two times a day (BID) | INTRAVENOUS | Status: DC

## 2015-11-14 MED ORDER — TRAZODONE HCL 50 MG PO TABS: 50.0000 mg | ORAL_TABLET | Freq: Every evening | ORAL | Status: DC | PRN

## 2015-11-14 MED ORDER — SODIUM CHLORIDE 0.9 % IV SOLN
8.0000 mg/h | INTRAVENOUS | Status: DC
  Administered 2015-11-14 – 2015-11-15 (×2): 8 mg/h via INTRAVENOUS
  Filled 2015-11-14 (×3): qty 80

## 2015-11-14 MED ORDER — FOLIC ACID 1 MG PO TABS
1.0000 mg | ORAL_TABLET | Freq: Every day | ORAL | Status: DC
  Administered 2015-11-14 – 2015-11-16 (×3): 1 mg via ORAL
  Filled 2015-11-14 (×3): qty 1

## 2015-11-14 MED ORDER — SODIUM CHLORIDE 0.9 % IV SOLN
10.0000 mL/h | Freq: Once | INTRAVENOUS | Status: AC
  Administered 2015-11-14: 21:00:00 10 mL/h via INTRAVENOUS

## 2015-11-14 MED ORDER — METOPROLOL TARTRATE 25 MG PO TABS
25.0000 mg | ORAL_TABLET | Freq: Two times a day (BID) | ORAL | Status: DC
  Administered 2015-11-14 – 2015-11-16 (×4): 25 mg via ORAL
  Filled 2015-11-14 (×4): qty 1

## 2015-11-14 MED ORDER — FEBUXOSTAT 40 MG PO TABS
40.0000 mg | ORAL_TABLET | Freq: Every day | ORAL | Status: DC
  Administered 2015-11-14 – 2015-11-16 (×3): 40 mg via ORAL
  Filled 2015-11-14 (×3): qty 1

## 2015-11-14 NOTE — ED Notes (Signed)
Patient transported to CT 

## 2015-11-14 NOTE — ED Provider Notes (Signed)
[facility] Emergency Department Provider Note   ____________________________________________  Time seen: Approximately 4:17 PM  I have reviewed the triage vital signs and the nursing notes.   HISTORY  Chief Complaint Weakness and Altered Mental Status  Caveat-history of present illness review of systems Limited due to the patient's altered mental status. All information is obtained from EMS on their arrival.  HPI [name] Holmes is a 81 y.o. female with history of alcohol abuse, chronic kidney disease, hypertension, history of GI bleed who presents for evaluation of lethargy, altered mental status, possible urinary tract infection and/or GI bleed. According to EMS, the patient home health nurse called 911 because the patient was somewhat lethargic, smells of urine, was generally weak and had decreased mobility when they checked on her today. Home health nurse was also concerned that she might of had a recurrent GI bleed. The patient reports that she feels well, she is unsure why she was brought to emergency department stating "maybe it's something with my period, I don't know". She denies any chest pain or abdominal pain currently.   Past Medical History  Diagnosis Date  . Arthritis   . Hypertension   . Anemia of chronic renal failure   . Gout   . ETOH abuse   . CKD (chronic kidney disease)     Patient Active Problem List   Diagnosis Date Noted  . GIB (gastrointestinal bleeding) 10/14/2015  . Melena 08/24/2015  . Anemia of chronic kidney failure 04/26/2015  . Pressure ulcer 12/06/2014  . Weakness 12/05/2014  . H/O ETOH abuse 12/05/2014  . HTN (hypertension) 12/05/2014  . Gout 12/05/2014  . Acute on chronic kidney failure ([place]) 11/03/2014  . UTI (lower urinary tract infection) 11/03/2014  . Anemia     Past Surgical History  Procedure Laterality Date  . Abdominal hysterectomy    . Esophagogastroduodenoscopy (egd) with propofol N/A 11/08/2014    Procedure: ESOPHAGOGASTRODUODENOSCOPY (EGD) WITH PROPOFOL;  Surgeon: Lollie Sails, MD;  Location: [facility] ENDOSCOPY;  Service: Endoscopy;  Laterality: N/A;  . Colonoscopy with propofol N/A 12/08/2014    Procedure: COLONOSCOPY WITH PROPOFOL;  Surgeon: Josefine Class, MD;  Location: [facility] ENDOSCOPY;  Service: Endoscopy;  Laterality: N/A;  . Givens capsule study N/A 08/26/2015    Procedure: GIVENS CAPSULE STUDY;  Surgeon: Josefine Class, MD;  Location: [facility] ENDOSCOPY;  Service: Endoscopy;  Laterality: N/A;    No current outpatient prescriptions on file.  Allergies Review of patient's allergies indicates no known allergies.  Family History  Problem Relation Age of Onset  . Hypertension Father     Social History Social History  Substance Use Topics  . Smoking status: Former Smoker    Types: Cigarettes  . Smokeless tobacco: Never Used  . Alcohol Use: 7.2 oz/week    6 Cans of beer, 6 Shots of liquor per week     Comment: daily    Review of Systems  Cardiovascular: Denies chest pain. Gastrointestinal: No abdominal pain.    Caveat-history of present illness review of systems Limited due to the patient's altered mental status. All information is obtained from EMS on their arrival. ____________________________________________   PHYSICAL EXAM:  VITAL SIGNS: ED Triage Vitals  Enc Vitals Group     BP 11/14/15 1605 121/82 mmHg     Pulse Rate 11/14/15 1611 78     Resp 11/14/15 1611 11     Temp 11/14/15 1611 98.1 F (36.7 C)     Temp Source 11/14/15 1611 Oral  SpO2 11/14/15 1611 99 %     Weight 11/14/15 1611 130 lb (58.968 kg)     Height 11/14/15 1611 5\' 4"  (1.626 m)     Head Cir --      Peak Flow --      Pain Score --      Pain Loc --      Pain Edu? --      Excl. in [place]? --     Constitutional: Alert and oriented to self and place but not to year or situation. Well appearing and in no acute distress. Eyes: Conjunctivae are normal. PERRL. EOMI. Head:  Atraumatic. Nose: No congestion/rhinnorhea. Mouth/Throat: Mucous membranes are moist.  Oropharynx non-erythematous. Neck: No stridor.  Supple without meningismus. Cardiovascular: Normal rate, regular rhythm. Grossly normal heart sounds.  Good peripheral circulation. Respiratory: Normal respiratory effort.  No retractions. Lungs CTAB. Gastrointestinal: Soft and nontender. No distention. No CVA tenderness. Rectal: black tarry stool in the rectal vault is guaiac negative, no gross blood. Musculoskeletal: No lower extremity tenderness nor edema.  No joint effusions. Neurologic:  Normal speech and language. 5 out of 5 strength bilateral upper lower extremities, 4 out of 5 strength in bilateral lower extremities but I suspect that this is effort dependent. Intact sensation to light touch. Symmetric smile. Skin:  Skin is warm, dry and intact. No rash noted. Psychiatric: Mood and affect are normal. Speech and behavior are normal.  ____________________________________________   LABS (all labs ordered are listed, but only abnormal results are displayed)  Labs Reviewed  CBC WITH DIFFERENTIAL/PLATELET - Abnormal; Notable for the following:    RBC 2.30 (*)    Hemoglobin 6.6 (*)    HCT 20.0 (*)    RDW 18.0 (*)    Platelets 80 (*)    All other components within normal limits  COMPREHENSIVE METABOLIC PANEL - Abnormal; Notable for the following:    Sodium 133 (*)    BUN 40 (*)    Creatinine, Ser 3.17 (*)    Calcium 8.6 (*)    Total Protein 6.2 (*)    Albumin 2.1 (*)    GFR calc non Af Amer 13 (*)    GFR calc Af Amer 15 (*)    All other components within normal limits  PROTIME-INR - Abnormal; Notable for the following:    Prothrombin Time 16.4 (*)    All other components within normal limits  URINALYSIS COMPLETEWITH MICROSCOPIC ([facility] ONLY) - Abnormal; Notable for the following:    Color, Urine AMBER (*)    APPearance TURBID (*)    Ketones, ur TRACE (*)    Hgb urine dipstick 1+ (*)     Protein, ur 100 (*)    Leukocytes, UA 3+ (*)    Bacteria, UA MANY (*)    Squamous Epithelial / LPF 0-5 (*)    All other components within normal limits  BLOOD GAS, VENOUS - Abnormal; Notable for the following:    pO2, Ven <31.0 (*)    All other components within normal limits  URINE CULTURE  TROPONIN I  APTT  ETHANOL  TSH  BASIC METABOLIC PANEL  CBC  PREPARE RBC (CROSSMATCH)  TYPE AND SCREEN   ____________________________________________  EKG  ED ECG REPORT I, Joanne Gavel, the attending physician, personally viewed and interpreted this ECG.   Date: 11/14/2015  EKG Time: 16:11  Rate: 74  Rhythm: normal sinus rhythm  Axis: normal  Intervals:none  ST&T Change: No acute ST elevation. No acute ST depression. Probable LVH.  ____________________________________________  RADIOLOGY  CXR IMPRESSION: No active cardiopulmonary disease.   CT head IMPRESSION: No focal acute intra cranial abnormality identified.  Chronic diffuse atrophy and chronic bilateral periventricular white matter small vessel ischemic change. ____________________________________________   PROCEDURES  Procedure(s) performed: None  Critical Care performed: Yes. Total critical care time spent 35 minutes.  ____________________________________________   INITIAL IMPRESSION / ASSESSMENT AND PLAN / ED COURSE  Pertinent labs & imaging results that were available during my care of the patient were reviewed by me and considered in my medical decision making (see chart for details).  [name] Holmes is a 81 y.o. female with history of alcohol abuse, chronic kidney disease, hypertension, history of GI bleed who presents for evaluation of lethargy, altered mental status, possible urinary tract infection and/or GI bleed. On exam, she is nontoxic appearing and in no acute distress. Her vital signs are stable, she is afebrile. She is alert and oriented to self and place only but not to year, not to  situation. She has a benign abdominal exam. She does have dark tarry stool in the rectal vault is concerning for a GI bleed. The table was negative for occult blood however I suspect that this is because the card was saturated with sample, producing a false negative. She does have some weakness in bilateral lower extremities but I suspect this is effort dependent, we'll obtain screening labs, chest x-ray, CT head, urinalysis, reassess.  ----------------------------------------- 5:47 PM on 11/14/2015 ----------------------------------------- CBC was severe anemia, hemoglobin 6.6 which is down from 10.3 just 4 weeks ago. The patient is too altered to give consent for blood however we will emergently transfuse at this time given medical necessity. Additionally, she was transfused 3 units of PRBCs just one month ago and I suspect if she were able to give consent, she likely would agree. I've attempted to contact her sister/emergency contact without success. IV Protonix ordered. CBC is notable for mild creatinine elevation at 3.17, IV fluids were given. Negative troponin. Chest x-ray and CT head showed no acute abnormality. Case discussed with the hospitalist, Dr. Reva Bores, for admission at this time.  ____________________________________________   FINAL CLINICAL IMPRESSION(S) / ED DIAGNOSES  Final diagnoses:  Altered mental status, unspecified altered mental status type  Acute upper GI bleed      NEW MEDICATIONS STARTED DURING THIS VISIT:  Current Discharge Medication List       Note:  This document was prepared using Dragon voice recognition software and may include unintentional dictation errors.    Joanne Gavel, MD 11/14/15 [phone]

## 2015-11-14 NOTE — ED Notes (Signed)
Pt to ED via EMS from home c/o generalized weakness.  EMS states patient seen by home health once a day.  States generalized weakness, AMS, and painful urination.  Pt oriented to person and place, disoriented to situation and time.  Pt denies pain at this time.

## 2015-11-14 NOTE — H&P (Signed)
[facility] [place] NAME: [name] Holmes    MR#:  [id number]  DATE OF BIRTH:  1935-03-31  DATE OF ADMISSION:  11/14/2015  PRIMARY CARE PHYSICIAN: Marguerita Merles, MD   REQUESTING/REFERRING PHYSICIAN: Darrick Penna  CHIEF COMPLAINT:   Chief Complaint  Patient presents with  . Weakness  . Altered Mental Status    HISTORY OF PRESENT ILLNESS: [name] Holmes  is a 81 y.o. female with a known history of Arthritis, hypertension, gout, alcohol abuse, chronic kidney disease, GI bleed- lives at home and was very confused so caretaker sent her to emergency room. Patient is confused so not able to give me any history but this history obtained from calling her niece on phone. As per her husband patient was discharged from hospital 2 months ago since then she is not eating very well and she is getting gradually more and more weak. At home she lives alone and her daughter lives nearby keep visiting her every now and then frequently. He should continue to get weak but for last 1 or 2 days she is staying mostly sleepy and not eating at all so they tried to bring her to emergency room yesterday but patient refused for that. In emergency room today she was found to have UTI and her hemoglobin dropped again, and guaiac is positive. In past when she was here with low hemoglobin GI consult was done and Dr. Candace Cruise suggested her to be referred to [facility] gastro[facility]ology for endoscopy and colonoscopy as she would be high risk.  ER physician could not get in touch with any of the family members and so finally she decided in patient's best interest to go ahead and transfuse her blood.  PAST MEDICAL HISTORY:   Past Medical History  Diagnosis Date  . Arthritis   . Hypertension   . Anemia of chronic renal failure   . Gout   . ETOH abuse   . CKD (chronic kidney disease)     PAST SURGICAL HISTORY: Past Surgical History  Procedure Laterality Date  . Abdominal hysterectomy    .  Esophagogastroduodenoscopy (egd) with propofol N/A 11/08/2014    Procedure: ESOPHAGOGASTRODUODENOSCOPY (EGD) WITH PROPOFOL;  Surgeon: Lollie Sails, MD;  Location: [facility] ENDOSCOPY;  Service: Endoscopy;  Laterality: N/A;  . Colonoscopy with propofol N/A 12/08/2014    Procedure: COLONOSCOPY WITH PROPOFOL;  Surgeon: Josefine Class, MD;  Location: [facility] ENDOSCOPY;  Service: Endoscopy;  Laterality: N/A;  . Givens capsule study N/A 08/26/2015    Procedure: GIVENS CAPSULE STUDY;  Surgeon: Josefine Class, MD;  Location: [facility] ENDOSCOPY;  Service: Endoscopy;  Laterality: N/A;    SOCIAL HISTORY:  Social History  Substance Use Topics  . Smoking status: Former Smoker    Types: Cigarettes  . Smokeless tobacco: Never Used  . Alcohol Use: 7.2 oz/week    6 Cans of beer, 6 Shots of liquor per week     Comment: daily    FAMILY HISTORY:  Family History  Problem Relation Age of Onset  . Hypertension Father     DRUG ALLERGIES: No Known Allergies  REVIEW OF SYSTEMS:   Patient is confused and not able to give review of system.  MEDICATIONS AT HOME:  Prior to Admission medications   Medication Sig Start Date End Date Taking? Authorizing Provider  acetaminophen (TYLENOL) 325 MG tablet Take 650 mg by mouth every 6 (six) hours as needed for mild pain or headache.    Yes Historical Provider, MD  allopurinol (ZYLOPRIM) 100 MG tablet Take 100 mg by mouth daily.   Yes Historical Provider, MD  amLODipine (NORVASC) 5 MG tablet Take 5 mg by mouth daily.   Yes Historical Provider, MD  cholecalciferol (VITAMIN D) 1000 units tablet Take 1,000 Units by mouth daily.   Yes Historical Provider, MD  febuxostat (ULORIC) 40 MG tablet Take 40 mg by mouth daily.   Yes Historical Provider, MD  folic acid (FOLVITE) 1 MG tablet Take 1 mg by mouth daily.   Yes Historical Provider, MD  furosemide (LASIX) 40 MG tablet Take 40 mg by mouth daily.   Yes Historical Provider, MD  levothyroxine (SYNTHROID, LEVOTHROID) 50 MCG  tablet Take 50 mcg by mouth daily before breakfast.    Yes Historical Provider, MD  magnesium oxide (MAG-OX) 400 MG tablet Take 400 mg by mouth daily.   Yes Historical Provider, MD  metoprolol tartrate (LOPRESSOR) 25 MG tablet Take 25 mg by mouth 2 (two) times daily.   Yes Historical Provider, MD  Multiple Vitamin (MULTIVITAMIN WITH MINERALS) TABS tablet Take 1 tablet by mouth daily.   Yes Historical Provider, MD  omeprazole (PRILOSEC) 20 MG capsule Take 20 mg by mouth 2 (two) times daily before a meal.   Yes Historical Provider, MD  oxybutynin (DITROPAN-XL) 5 MG 24 hr tablet Take 5 mg by mouth at bedtime.   Yes Historical Provider, MD  polyethylene glycol (MIRALAX / GLYCOLAX) packet Take 17 g by mouth daily as needed for mild constipation.    Yes Historical Provider, MD  potassium chloride (K-DUR,KLOR-CON) 10 MEQ tablet Take 10 mEq by mouth daily.   Yes Historical Provider, MD  thiamine (VITAMIN B-1) 100 MG tablet Take 100 mg by mouth daily.   Yes Historical Provider, MD  traZODone (DESYREL) 50 MG tablet Take 50 mg by mouth at bedtime as needed for sleep.    Yes Historical Provider, MD      PHYSICAL EXAMINATION:   VITAL SIGNS: Blood pressure 152/77, pulse 66, temperature 98.1 F (36.7 C), temperature source Oral, resp. rate 18, height 5\' 4"  (1.626 m), weight 58.968 kg (130 lb), SpO2 97 %.  GENERAL:  81 y.o.-year-old patient lying in the bed with no acute distress.  EYES: Pupils equal, round, reactive to light and accommodation. No scleral icterus. Extraocular muscles intact. Conjunctiva pale. HEENT: Head atraumatic, normocephalic. Oropharynx and nasopharynx clear.  NECK:  Supple, no jugular venous distention. No thyroid enlargement, no tenderness.  LUNGS: Normal breath sounds bilaterally, no wheezing, rales,rhonchi or crepitation. No use of accessory muscles of respiration.  CARDIOVASCULAR: S1, S2 normal. No murmurs, rubs, or gallops.  ABDOMEN: Soft, nontender, nondistended. Bowel sounds  present. No organomegaly or mass.  EXTREMITIES: No pedal edema, cyanosis, or clubbing.  NEUROLOGIC: Patient is drowsy and confused, follows simple commands. PSYCHIATRIC: The patient is drowsy and disoriented.  SKIN: No obvious rash, lesion, or ulcer.   LABORATORY PANEL:   CBC  Recent Labs Lab 11/14/15 1610  WBC 6.5  HGB 6.6*  HCT 20.0*  PLT 80*  MCV 87.0  MCH 28.5  MCHC 32.7  RDW 18.0*  LYMPHSABS 1.3  MONOABS 0.4  EOSABS 0.1  BASOSABS 0.0   ------------------------------------------------------------------------------------------------------------------  Chemistries   Recent Labs Lab 11/14/15 1610  NA 133*  K 4.1  CL 105  CO2 22  GLUCOSE 98  BUN 40*  CREATININE 3.17*  CALCIUM 8.6*  AST 34  ALT 21  ALKPHOS 53  BILITOT 0.6   ------------------------------------------------------------------------------------------------------------------ estimated creatinine clearance is 12.6 mL/min (by C-G  formula based on Cr of 3.17). ------------------------------------------------------------------------------------------------------------------ No results for input(s): TSH, T4TOTAL, T3FREE, THYROIDAB in the last 72 hours.  Invalid input(s): FREET3   Coagulation profile  Recent Labs Lab 11/14/15 1610  INR 1.31   ------------------------------------------------------------------------------------------------------------------- No results for input(s): DDIMER in the last 72 hours. -------------------------------------------------------------------------------------------------------------------  Cardiac Enzymes  Recent Labs Lab 11/14/15 1610  TROPONINI <0.03   ------------------------------------------------------------------------------------------------------------------ Invalid input(s): POCBNP  ---------------------------------------------------------------------------------------------------------------  Urinalysis    Component Value Date/Time    COLORURINE AMBER* 11/14/2015 1610   COLORURINE Yellow 06/06/2014 1933   APPEARANCEUR TURBID* 11/14/2015 1610   APPEARANCEUR Cloudy 06/06/2014 1933   LABSPEC 1.010 11/14/2015 1610   LABSPEC 1.009 06/06/2014 1933   PHURINE 6.0 11/14/2015 1610   PHURINE 5.0 06/06/2014 1933   GLUCOSEU NEGATIVE 11/14/2015 1610   GLUCOSEU Negative 06/06/2014 1933   HGBUR 1+* 11/14/2015 1610   HGBUR 1+ 06/06/2014 1933   BILIRUBINUR NEGATIVE 11/14/2015 1610   BILIRUBINUR Negative 06/06/2014 1933   KETONESUR TRACE* 11/14/2015 1610   KETONESUR Negative 06/06/2014 1933   PROTEINUR 100* 11/14/2015 1610   PROTEINUR Negative 06/06/2014 1933   NITRITE NEGATIVE 11/14/2015 1610   NITRITE Negative 06/06/2014 1933   LEUKOCYTESUR 3+* 11/14/2015 1610   LEUKOCYTESUR 3+ 06/06/2014 1933     RADIOLOGY: Dg Chest 2 View  11/14/2015  CLINICAL DATA:  Altered mental status. EXAM: CHEST  2 VIEW COMPARISON:  Nov 03, 2014. FINDINGS: Stable cardiomediastinal silhouette. No pneumothorax or pleural effusion is noted. Both lungs are clear. The visualized skeletal structures are unremarkable. IMPRESSION: No active cardiopulmonary disease. Electronically Signed   By: Marijo Conception, M.D.   On: 11/14/2015 16:58   Ct Head Wo Contrast  11/14/2015  CLINICAL DATA:  Generalized weakness. EXAM: CT HEAD WITHOUT CONTRAST TECHNIQUE: Contiguous axial images were obtained from the base of the skull through the vertex without intravenous contrast. COMPARISON:  Nov 03, 2014 FINDINGS: There is chronic diffuse atrophy. Chronic bilateral periventricular white matter small vessel ischemic changes identified. There is no midline shift, hydrocephalus, or mass. No acute hemorrhage or acute transcortical infarct is identified. The bony calvarium is intact. The visualized sinuses are clear. IMPRESSION: No focal acute intra cranial abnormality identified. Chronic diffuse atrophy and chronic bilateral periventricular white matter small vessel ischemic change.  Electronically Signed   By: Abelardo Diesel M.D.   On: 11/14/2015 16:42    EKG: Orders placed or performed during the hospital encounter of 11/14/15  . EKG 12-Lead  . EKG 12-Lead    IMPRESSION AND PLAN:  * Acute on chronic blood loss anemia due to GI bleed   Transfuse PRBC as ordered by ER, follow serial hemoglobin, give PPI IV twice a day, get GI consult.   In the past she was told to follow at [facility] but as per niece she did not follow-up anywhere.  * UTI causing altered mental status   IV ceftriaxone and get a urine culture.  * Acute on chronic renal failure   Slight worsening in creatinine, as patient does not have good oral intake   We'll give IV fluid and monitor.  * Severe malnutrition   Patient has very less oral intake as per her niece, albumin is low.   And has chronic GI bleed.   I will call palliative care consult to make further decisions and planning.  * Hypertension   We will hold some of the hand antihypertensives as she would not be eating much.  * Hypothyroidism   Continue Synthroid, check the TSH level.  All the records are reviewed and case discussed with ED provider. Management plans discussed with the patient, family and they are in agreement.  CODE STATUS: Code Status History    Date Active Date Inactive Code Status Order ID Comments User Context   10/14/2015  8:48 AM 10/16/2015  8:33 PM Full Code [id number]  [name], MD Inpatient   08/24/2015 11:18 PM 08/28/2015  7:38 PM Full Code [id number]  Hillary Bow, MD ED   12/05/2014  8:51 PM 12/08/2014 10:13 PM Full Code [id number]  Dustin Flock, MD Inpatient   11/03/2014  9:43 AM 11/08/2014 11:46 PM Full Code [id number]  Harrie Foreman, MD Inpatient    Advance Directive Documentation        Most Recent Value   Type of Advance Directive  Healthcare Power of Attorney [Niece]   Pre-existing out of facility DNR order (yellow form or pink MOST form)     "MOST" Form in Place?       I spoke to patient's niece on  phone.  TOTAL TIME TAKING CARE OF THIS PATIENT: 50 minutes.    Vaughan Basta M.D on 11/14/2015   Between 7am to 6pm - Pager - [phone]  After 6pm go to [email or web address] - password [email or web address] [facility]  Office  [phone]  CC: Primary care physician; Marguerita Merles, MD   Note: This dictation was prepared with Dragon dictation along with smaller phrase technology. Any transcriptional errors that result from this process are unintentional.

## 2015-11-15 LAB — FERRITIN: Ferritin: 3356 ng/mL — ABNORMAL HIGH (ref 11–307)

## 2015-11-15 LAB — BASIC METABOLIC PANEL
Anion gap: 4 — ABNORMAL LOW (ref 5–15)
BUN: 37 mg/dL — ABNORMAL HIGH (ref 6–20)
CHLORIDE: 106 mmol/L (ref 101–111)
CO2: 24 mmol/L (ref 22–32)
Calcium: 8.1 mg/dL — ABNORMAL LOW (ref 8.9–10.3)
Creatinine, Ser: 2.79 mg/dL — ABNORMAL HIGH (ref 0.44–1.00)
GFR calc non Af Amer: 15 mL/min — ABNORMAL LOW (ref >60)
GFR, EST AFRICAN AMERICAN: 18 mL/min — AB (ref >60)
Glucose, Bld: 88 mg/dL (ref 65–99)
POTASSIUM: 4 mmol/L (ref 3.5–5.1)
SODIUM: 134 mmol/L — AB (ref 135–145)

## 2015-11-15 LAB — IRON AND TIBC: Iron: 67 ug/dL (ref 28–170)

## 2015-11-15 LAB — CBC
HEMATOCRIT: 31.8 % — AB (ref 35.0–47.0)
HEMOGLOBIN: 10.5 g/dL — AB (ref 12.0–16.0)
MCH: 29.2 pg (ref 26.0–34.0)
MCHC: 33.1 g/dL (ref 32.0–36.0)
MCV: 88.2 fL (ref 80.0–100.0)
PLATELETS: 71 10*3/uL — AB (ref 150–440)
RBC: 3.6 MIL/uL — AB (ref 3.80–5.20)
RDW: 16.1 % — AB (ref 11.5–14.5)
WBC: 5.2 10*3/uL (ref 3.6–11.0)

## 2015-11-15 NOTE — Progress Notes (Signed)
PT Cancellation Note  Patient Details Name: [name] Holmes MRN: [id number] DOB: [birth year]   Cancelled Treatment:    Reason Eval/Treat Not Completed: Patient declined, no reason specified.  Pt adamantly declining to perform any functional mobility or ex's (pt declined to participate in PT) d/t being "tired" and wanting to rest instead.  Will re-attempt PT eval at a later date/time as medically appropriate.   Raquel Sarna Shaliyah Taite 11/15/2015, 2:47 PM Leitha Bleak, [place]

## 2015-11-15 NOTE — Consult Note (Signed)
Please see full GI consult by Ms Constance Haw.  Patient seen and examined, chart reviewed.  Patient admitted with AMS, found to be anemic.  Patietn has a history of anemia and has had multiple egd and colonoscopies without localization of a source.   I have reviewed he video capsule report and pictures and doubt this would be the source of bleeding. It is of note that patient has a h/o etoh abuse, and possible liver disease related to that  With thrombocytopenia, hypoalbuminemia, mid coagulopathy.  Her anemia is likely multifactorial with contribution from CKD, possible GI loss (although she is heme negative to testing today, despite the black color)  And her indices are not microcytic, but rather normocytic, moreso c/w ACD. Currently continues with AMS, not oriented to time or place, but denies abdominal pain.  Medical compliance with PPI questionable.   Recommend [place] with dopplers of the portal splenic and hepatic veins, continue ppi, stool for h pylori antigen, hematology consult re anemia.  Not currently a candidate for sedated proceedures, she is hemodynamically stable.   Following.

## 2015-11-15 NOTE — Consult Note (Signed)
GI Inpatient Consult Note  Reason for Consult: GI bleed   Attending Requesting Consult: Dr. Benjie Karvonen  History of Present Illness: [name] Holmes is a 81 y.o. female seen for evaluation of GI Bleed at the request of Dr. Benjie Karvonen. PMhx of HTN, ETOH abuse, chronic anemia, chronic kidney disease.  Patient admitted for altered mental status which concerned her home health nurse-had lethargy and weakness so brought her in for evaluation. She is unable to given a reliable history. Tells me its the 1930s and she is at home on her couch. She denies any pain currently.   Per chart review has been admitted 09/2015, 08/2015, 11/2014 for GI bleeds. Work up as below. No family at bedside.      Last Colonoscopy: 11/2014-Dr. Rein-diverticulosis in sigmoid, descending, transverse, ascending. 46mm polyp.   Last Endoscopy: 10/2014-Dr. Skulskie-normal esophagus, small HH, normal stomach, deformity in gastric antrum w/ "Chamber" immediately before entering dudoenal bulb w/o evidence of active ulceration.   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 Not bleeding. Unlikely these could be reached with colonoscope.  Recs: - consider device assisted retrograde enteroscopy at tertiary center if develops overt GI bleeding with large drop in Hgbb.      Past Medical History:  Past Medical History  Diagnosis Date  . Arthritis   . Hypertension   . Anemia of chronic renal failure   . Gout   . ETOH abuse   . CKD (chronic kidney disease)     Problem List: Patient Active Problem List   Diagnosis Date Noted  . GIB (gastrointestinal bleeding) 10/14/2015  . Melena 08/24/2015  . Anemia of chronic kidney failure 04/26/2015  . Pressure ulcer 12/06/2014  . Weakness 12/05/2014  . H/O ETOH abuse 12/05/2014  . HTN (hypertension) 12/05/2014  . Gout 12/05/2014  . Acute on chronic kidney failure ([place]) 11/03/2014  . UTI (lower urinary tract infection) 11/03/2014  . Anemia     Past Surgical  History: Past Surgical History  Procedure Laterality Date  . Abdominal hysterectomy    . Esophagogastroduodenoscopy (egd) with propofol N/A 11/08/2014    Procedure: ESOPHAGOGASTRODUODENOSCOPY (EGD) WITH PROPOFOL;  Surgeon: Lollie Sails, MD;  Location: [facility] ENDOSCOPY;  Service: Endoscopy;  Laterality: N/A;  . Colonoscopy with propofol N/A 12/08/2014    Procedure: COLONOSCOPY WITH PROPOFOL;  Surgeon: Josefine Class, MD;  Location: [facility] ENDOSCOPY;  Service: Endoscopy;  Laterality: N/A;  . Givens capsule study N/A 08/26/2015    Procedure: GIVENS CAPSULE STUDY;  Surgeon: Josefine Class, MD;  Location: [facility] ENDOSCOPY;  Service: Endoscopy;  Laterality: N/A;    Allergies: No Known Allergies  Home Medications: Prescriptions prior to admission  Medication Sig Dispense Refill Last Dose  . acetaminophen (TYLENOL) 325 MG tablet Take 650 mg by mouth every 6 (six) hours as needed for mild pain or headache.    PRN at PRN  . allopurinol (ZYLOPRIM) 100 MG tablet Take 100 mg by mouth daily.   unknown at unknown  . amLODipine (NORVASC) 5 MG tablet Take 5 mg by mouth daily.   unknown at unknown   . cholecalciferol (VITAMIN D) 1000 units tablet Take 1,000 Units by mouth daily.   unknown at unknown  . febuxostat (ULORIC) 40 MG tablet Take 40 mg by mouth daily.   unknown at unknown   . folic acid (FOLVITE) 1 MG tablet Take 1 mg by mouth daily.   unknown at unknown  . furosemide (LASIX) 40 MG tablet Take 40 mg by mouth daily.  unknown at unknown  . levothyroxine (SYNTHROID, LEVOTHROID) 50 MCG tablet Take 50 mcg by mouth daily before breakfast.    unknown at unknown   . magnesium oxide (MAG-OX) 400 MG tablet Take 400 mg by mouth daily.   unknown at unknown  . metoprolol tartrate (LOPRESSOR) 25 MG tablet Take 25 mg by mouth 2 (two) times daily.   unknown at unknown   . Multiple Vitamin (MULTIVITAMIN WITH MINERALS) TABS tablet Take 1 tablet by mouth daily.   unknown at unknown   . omeprazole (PRILOSEC) 20  MG capsule Take 20 mg by mouth 2 (two) times daily before a meal.   unknown at unknown  . oxybutynin (DITROPAN-XL) 5 MG 24 hr tablet Take 5 mg by mouth at bedtime.   unknown at unknown   . polyethylene glycol (MIRALAX / GLYCOLAX) packet Take 17 g by mouth daily as needed for mild constipation.    PRN at PRN  . potassium chloride (K-DUR,KLOR-CON) 10 MEQ tablet Take 10 mEq by mouth daily.   unknown at unknown  . thiamine (VITAMIN B-1) 100 MG tablet Take 100 mg by mouth daily.   unknown at unknown   . traZODone (DESYREL) 50 MG tablet Take 50 mg by mouth at bedtime as needed for sleep.    PRN at PRN   Home medication reconciliation was completed with the patient.   Scheduled Inpatient Medications:   . allopurinol  100 mg Oral Daily  . cefTRIAXone (ROCEPHIN)  IV  1 g Intravenous Q24H  . cholecalciferol  1,000 Units Oral Daily  . febuxostat  40 mg Oral Daily  . folic acid  1 mg Oral Daily  . levothyroxine  50 mcg Oral QAC breakfast  . magnesium oxide  400 mg Oral Daily  . metoprolol tartrate  25 mg Oral BID  . multivitamin with minerals  1 tablet Oral Daily  . oxybutynin  5 mg Oral QHS  . [START ON 11/17/2015] pantoprazole (PROTONIX) IV  40 mg Intravenous Q12H  . thiamine  100 mg Oral Daily    Continuous Inpatient Infusions:   . sodium chloride 75 mL/hr at 11/15/15 0250    PRN Inpatient Medications:  traZODone  Family History: family history includes Hypertension in her father.   Social History:   reports that she has quit smoking. Her smoking use included Cigarettes. She has never used smokeless tobacco. She reports that she drinks about 7.2 oz of alcohol per week. She reports that she does not use illicit drugs.    Review of Systems: Constitutional: Weight is stable.  Eyes: No changes in vision. ENT: No oral lesions, sore throat.  GI: see HPI.  Heme/Lymph: No easy bruising.  CV: No chest pain.  GU: No hematuria.  Integumentary: No rashes.  Neuro: No headaches.  Psych: No  depression/anxiety.  Endocrine: No heat/cold intolerance.  Allergic/Immunologic: No urticaria.  Resp: No cough, SOB.  Musculoskeletal: No joint swelling.    Physical Examination: BP 119/74 mmHg  Pulse 63  Temp(Src) 98.4 F (36.9 C) (Oral)  Resp 17  Ht 5\' 4"  (1.626 m)  Wt 106 lb 12.8 oz (48.444 kg)  BMI 18.32 kg/m2  SpO2 100% Gen: NAD, alert and oriented only to person.  HEENT: PEERLA, EOMI, Neck: supple, no JVD or thyromegaly Chest: CTA bilaterally, no wheezes, crackles, or other adventitious sounds CV: RRR, no m/g/c/r Abd: soft, NT, ND, +BS in all four quadrants; no HSM, guarding, ridigity, or rebound tenderness Rectal-black stool in rectal vault that is occult blood negative.  Ext: no edema, well perfused with 2+ pulses, Skin: no rash or lesions noted Lymph: no LAD  Data: Lab Results  Component Value Date   WBC 5.2 11/15/2015   HGB 10.5* 11/15/2015   HCT 31.8* 11/15/2015   MCV 88.2 11/15/2015   PLT 71* 11/15/2015    Recent Labs Lab 11/14/15 1610 11/15/15 0446  HGB 6.6* 10.5*   Lab Results  Component Value Date   NA 134* 11/15/2015   K 4.0 11/15/2015   CL 106 11/15/2015   CO2 24 11/15/2015   BUN 37* 11/15/2015   CREATININE 2.79* 11/15/2015   Lab Results  Component Value Date   ALT 21 11/14/2015   AST 34 11/14/2015   ALKPHOS 53 11/14/2015   BILITOT 0.6 11/14/2015    Recent Labs Lab 11/14/15 1610  APTT 30  INR 1.31   Assessment/Plan: Ms. Tak is a 81 y.o. female admitted for AMS and lethargy found to have Hgb 6.6.   1. Anemia - likely multifactorial anemia. Has melena but has been heme negative on multiple prior exams including today, EGD/colonoscopy unremarkable. Capsule as above. Given her chronic thrombocytopenia as well concerned regarding bone marrow suppression and liver disease (low albumin, low platelet, alcohol hx) as source of anemia. Chronically normocytic. Recommend adding on iron studies to admission labs to check for IDA that would  suggest small bowel bleeding. No recent abd imaging-check abdominal US with portal, splenic, and hepatic dopplers. Will get hematology consult for evaluation. Alcohol abstinence critical at d/c.   Recommendations:  1. Hematology consult 2. Iron studies on admission labs 3. Abdominal US w/ portal, splenic, hepatic dopplers 4. Can advance to full liquids diet.   *Case discussed w/ Dr. Gustavo Lah.   Thank you for the consult. Please call with questions or concerns.  Ronney Asters, PA-C [place]

## 2015-11-15 NOTE — Progress Notes (Signed)
[place] [facility] [place] NAME: [name] Holmes    MR#:  [id number]  DATE OF BIRTH:  05/23/1935  SUBJECTIVE:   patient here with weakness and confusion found to have low hgb and guiac + stools  REVIEW OF SYSTEMS:    Review of Systems  Constitutional: Positive for malaise/fatigue. Negative for fever and chills.  HENT: Negative for ear discharge, ear pain, hearing loss, nosebleeds and sore throat.   Eyes: Negative for blurred vision and pain.  Respiratory: Negative for cough, hemoptysis, shortness of breath and wheezing.   Cardiovascular: Negative for chest pain, palpitations and leg swelling.  Gastrointestinal: Positive for blood in stool. Negative for nausea, vomiting, abdominal pain and diarrhea.  Genitourinary: Negative for dysuria.  Musculoskeletal: Negative for back pain.  Neurological: Positive for weakness. Negative for dizziness, tremors, speech change, focal weakness, seizures and headaches.  Endo/Heme/Allergies: Does not bruise/bleed easily.  Psychiatric/Behavioral: Positive for memory loss. Negative for depression, suicidal ideas and hallucinations.    Tolerating Diet:yes clear liquid      DRUG ALLERGIES:  No Known Allergies  VITALS:  Blood pressure 119/74, pulse 63, temperature 98.4 F (36.9 C), temperature source Oral, resp. rate 17, height 5\' 4"  (1.626 m), weight 48.444 kg (106 lb 12.8 oz), SpO2 100 %.  PHYSICAL EXAMINATION:   Physical Exam  Constitutional: She is well-developed, well-nourished, and in no distress. No distress.  HENT:  Head: Normocephalic.  Eyes: No scleral icterus.  Neck: Normal range of motion. Neck supple. No JVD present. No tracheal deviation present.  Cardiovascular: Normal rate, regular rhythm and normal heart sounds.  Exam reveals no gallop and no friction rub.   No murmur heard. Pulmonary/Chest: Effort normal and breath sounds normal. No respiratory distress. She has no wheezes. She has no rales.  She exhibits no tenderness.  Abdominal: Soft. Bowel sounds are normal. She exhibits no distension and no mass. There is no tenderness. There is no rebound and no guarding.  Musculoskeletal: Normal range of motion. She exhibits no edema.  Neurological: She is alert.  To name and place  Skin: Skin is warm. No rash noted. No erythema.  Psychiatric: Affect normal.      LABORATORY PANEL:   CBC  Recent Labs Lab 11/15/15 0446  WBC 5.2  HGB 10.5*  HCT 31.8*  PLT 71*   ------------------------------------------------------------------------------------------------------------------  Chemistries   Recent Labs Lab 11/14/15 1610 11/15/15 0446  NA 133* 134*  K 4.1 4.0  CL 105 106  CO2 22 24  GLUCOSE 98 88  BUN 40* 37*  CREATININE 3.17* 2.79*  CALCIUM 8.6* 8.1*  AST 34  --   ALT 21  --   ALKPHOS 53  --   BILITOT 0.6  --    ------------------------------------------------------------------------------------------------------------------  Cardiac Enzymes  Recent Labs Lab 11/14/15 1610  TROPONINI <0.03   ------------------------------------------------------------------------------------------------------------------  RADIOLOGY:  Dg Chest 2 View  11/14/2015  CLINICAL DATA:  Altered mental status. EXAM: CHEST  2 VIEW COMPARISON:  Nov 03, 2014. FINDINGS: Stable cardiomediastinal silhouette. No pneumothorax or pleural effusion is noted. Both lungs are clear. The visualized skeletal structures are unremarkable. IMPRESSION: No active cardiopulmonary disease. Electronically Signed   By: Marijo Conception, M.D.   On: 11/14/2015 16:58   Ct Head Wo Contrast  11/14/2015  CLINICAL DATA:  Generalized weakness. EXAM: CT HEAD WITHOUT CONTRAST TECHNIQUE: Contiguous axial images were obtained from the base of the skull through the vertex without intravenous contrast. COMPARISON:  Nov 03, 2014 FINDINGS: There  is chronic diffuse atrophy. Chronic bilateral periventricular white matter small  vessel ischemic changes identified. There is no midline shift, hydrocephalus, or mass. No acute hemorrhage or acute transcortical infarct is identified. The bony calvarium is intact. The visualized sinuses are clear. IMPRESSION: No focal acute intra cranial abnormality identified. Chronic diffuse atrophy and chronic bilateral periventricular white matter small vessel ischemic change. Electronically Signed   By: Abelardo Diesel M.D.   On: 11/14/2015 16:42     ASSESSMENT AND PLAN:    81 year old female with a history of essential hypertension and chronic kidney disease stage IV who was sent to the emergency room with confusion and weakness and found to have hemoglobin of 6.6.   1. Acute on chronic blood loss anemia with quite positive stool: Status post 2 unit PRBC. Repeat CBC in a.m. Continue PPI. GI consult pending.  2. Altered mental status with Urinary tract infection: Continue ceftriaxone and follow up on urine culture.  3. Acute on chronic kidney disease stage IV: Creatinine is near baseline.  Continue IV fluids and hold nephrotoxic agents.   4. Essential hypertension: Continue metoprolol.  5. Hypothyroid: Continue Synthroid.  TSH within normal limits.   6. Severe protein calorie malnutrition: Palliative care consult.    Management plans discussed with the patient and she3is in agreement.  CODE STATUS: FULL  TOTAL TIME TAKING CARE OF THIS PATIENT: 30 minutes.     POSSIBLE D/C tomorrow, DEPENDING ON CLINICAL CONDITION.   Jeily Guthridge M.D on 11/15/2015 at 10:21 AM  Between 7am to 6pm - Pager - [phone] After 6pm go to [email or web address] - password [email or web address] [facility]  Office  [phone]  CC: Primary care physician; Marguerita Merles, MD  Note: This dictation was prepared with Dragon dictation along with smaller phrase technology. Any transcriptional errors that result from this process are unintentional.

## 2015-11-15 NOTE — Care Management (Signed)
Patient presents from home.  Sent in by home health nurse for lethargy and concern for gi bleed.  Patient's hgb was 6.6 and has received transfusion.  GI con  [facility] is aware of admission.  Patient says she would never consider any type of facility placement. Will need order to resume home health

## 2015-11-15 NOTE — Progress Notes (Signed)
[facility]  Patient Status: Active  [facility] is providing the following services: SN/PT  If patient discharges after hours, please call [phone].   [name] Holmes 11/15/2015, 8:24 AM

## 2015-11-16 ENCOUNTER — Inpatient Hospital Stay: Payer: Medicare Other

## 2015-11-16 DIAGNOSIS — F1020 Alcohol dependence, uncomplicated: Secondary | ICD-10-CM | POA: Insufficient documentation

## 2015-11-16 DIAGNOSIS — I129 Hypertensive chronic kidney disease with stage 1 through stage 4 chronic kidney disease, or unspecified chronic kidney disease: Secondary | ICD-10-CM

## 2015-11-16 DIAGNOSIS — Z8719 Personal history of other diseases of the digestive system: Secondary | ICD-10-CM

## 2015-11-16 DIAGNOSIS — Z87891 Personal history of nicotine dependence: Secondary | ICD-10-CM

## 2015-11-16 DIAGNOSIS — N179 Acute kidney failure, unspecified: Secondary | ICD-10-CM

## 2015-11-16 DIAGNOSIS — M109 Gout, unspecified: Secondary | ICD-10-CM

## 2015-11-16 DIAGNOSIS — E43 Unspecified severe protein-calorie malnutrition: Secondary | ICD-10-CM

## 2015-11-16 DIAGNOSIS — N390 Urinary tract infection, site not specified: Secondary | ICD-10-CM

## 2015-11-16 DIAGNOSIS — Z515 Encounter for palliative care: Secondary | ICD-10-CM

## 2015-11-16 DIAGNOSIS — N189 Chronic kidney disease, unspecified: Secondary | ICD-10-CM

## 2015-11-16 DIAGNOSIS — D631 Anemia in chronic kidney disease: Secondary | ICD-10-CM

## 2015-11-16 LAB — BASIC METABOLIC PANEL
ANION GAP: 5 (ref 5–15)
BUN: 34 mg/dL — ABNORMAL HIGH (ref 6–20)
CALCIUM: 7.6 mg/dL — AB (ref 8.9–10.3)
CO2: 22 mmol/L (ref 22–32)
CREATININE: 2.62 mg/dL — AB (ref 0.44–1.00)
Chloride: 107 mmol/L (ref 101–111)
GFR, EST AFRICAN AMERICAN: 19 mL/min — AB (ref >60)
GFR, EST NON AFRICAN AMERICAN: 16 mL/min — AB (ref >60)
Glucose, Bld: 84 mg/dL (ref 65–99)
Potassium: 3.8 mmol/L (ref 3.5–5.1)
SODIUM: 134 mmol/L — AB (ref 135–145)

## 2015-11-16 LAB — TYPE AND SCREEN
ABO/RH(D): O POS
Antibody Screen: NEGATIVE
UNIT DIVISION: 0
UNIT DIVISION: 0

## 2015-11-16 LAB — CBC
HCT: 32.5 % — ABNORMAL LOW (ref 35.0–47.0)
Hemoglobin: 11 g/dL — ABNORMAL LOW (ref 12.0–16.0)
MCH: 30.2 pg (ref 26.0–34.0)
MCHC: 33.8 g/dL (ref 32.0–36.0)
MCV: 89.3 fL (ref 80.0–100.0)
PLATELETS: 82 10*3/uL — AB (ref 150–440)
RBC: 3.63 MIL/uL — ABNORMAL LOW (ref 3.80–5.20)
RDW: 16.5 % — AB (ref 11.5–14.5)
WBC: 5.5 10*3/uL (ref 3.6–11.0)

## 2015-11-16 LAB — VITAMIN B12: VITAMIN B 12: 664 pg/mL (ref 180–914)

## 2015-11-16 MED ORDER — PANTOPRAZOLE SODIUM 40 MG PO TBEC: 40.0000 mg | DELAYED_RELEASE_TABLET | Freq: Every day | ORAL | Status: DC

## 2015-11-16 MED ORDER — ENSURE ENLIVE PO LIQD
237.0000 mL | Freq: Two times a day (BID) | ORAL | Status: DC
  Administered 2015-11-16 (×2): 237 mL via ORAL

## 2015-11-16 MED ORDER — CEFUROXIME AXETIL 500 MG PO TABS: 500.0000 mg | ORAL_TABLET | Freq: Every day | ORAL | Status: DC

## 2015-11-16 MED ORDER — PANTOPRAZOLE SODIUM 40 MG IV SOLR
40.0000 mg | Freq: Two times a day (BID) | INTRAVENOUS | Status: DC
  Administered 2015-11-16: 40 mg via INTRAVENOUS
  Filled 2015-11-16: qty 40

## 2015-11-16 MED ORDER — ENSURE ENLIVE PO LIQD: 237.0000 mL | Freq: Two times a day (BID) | ORAL | Status: DC

## 2015-11-16 NOTE — Discharge Summary (Signed)
[place] [facility] [place] NAME: [name] Holmes    MR#:  [id number]  DATE OF BIRTH:  09-16-1934  DATE OF ADMISSION:  11/14/2015 ADMITTING PHYSICIAN: Vaughan Basta, MD  DATE OF DISCHARGE: 11/16/2015  4:36 PM  PRIMARY CARE PHYSICIAN: Marguerita Merles, MD    ADMISSION DIAGNOSIS:  Acute upper GI bleed [K92.2] Altered mental status, unspecified altered mental status type [R41.82]  DISCHARGE DIAGNOSIS:  Active Problems:   UTI (lower urinary tract infection)   Alcoholism ([place])   SECONDARY DIAGNOSIS:   Past Medical History  Diagnosis Date  . Arthritis   . Hypertension   . Anemia of chronic renal failure   . Gout   . ETOH abuse   . CKD (chronic kidney disease)     HOSPITAL COURSE:  81 year old female with a history of essential hypertension and chronic kidney disease stage IV who was sent to the emergency room with confusion and weakness and found to have hemoglobin of 6.6.   1. Acute on chronic blood loss anemia with guaic positive stool: She has had GI workup in the past for anemia without localization of the source.These studies include EGD, colonoscopy and most recently capsule. Chronic anemia is due to chronic kidney disease and EtOH abuse. She received 2 units of PRBC on admission and her hemoglobin has been stable since then. Continue PPI. Abdominal ultrasound shows unremarkable liver. There are gallstones and bilateral renal lesions which need outpatient CT scan if the family desires further workup.  Patient can follow-up as an outpatient with oncology/hematology if needed for anemia and thrombocytopenia.   2. Altered mental status due to Urinary tract infection: She can be discharged on PO CEFTIN. Urine culture is greater than 100,000 Escherichia coli.  3. Acute on chronic kidney disease stage IV due to prerenal azotemia with Etoh abuse: Creatinine is near baseline.  .   4. Essential hypertension: Continue metoprolol.  5.  Hypothyroid: Continue Synthroid.  TSH within normal limits.   6. Severe protein calorie malnutrition: Palliative care consult.was appreciated. She may benefit from home palliative consult   7. EtoH abuse: She does not want to quit and had no signs of withdrawal while in the hospital.   DISCHARGE CONDITIONS AND DIET:  Discharge home stable condition with HHC regular diet  CONSULTS OBTAINED:  Treatment Team:  Cammie Sickle, MD Lollie Sails, MD  DRUG ALLERGIES:  No Known Allergies  DISCHARGE MEDICATIONS:   Discharge Medication List as of 11/16/2015 11:58 AM    START taking these medications   Details  cefUROXime (CEFTIN) 500 MG tablet Take 1 tablet (500 mg total) by mouth daily., Starting 11/16/2015, Until Discontinued, Normal    feeding supplement, ENSURE ENLIVE, (ENSURE ENLIVE) LIQD Take 237 mLs by mouth 2 (two) times daily between meals., Starting 11/16/2015, Until Discontinued, Normal    pantoprazole (PROTONIX) 40 MG tablet Take 1 tablet (40 mg total) by mouth daily., Starting 11/16/2015, Until Discontinued, Normal      CONTINUE these medications which have NOT CHANGED   Details  allopurinol (ZYLOPRIM) 100 MG tablet Take 100 mg by mouth daily., Until Discontinued, Historical Med    amLODipine (NORVASC) 5 MG tablet Take 5 mg by mouth daily., Until Discontinued, Historical Med    cholecalciferol (VITAMIN D) 1000 units tablet Take 1,000 Units by mouth daily., Until Discontinued, Historical Med    febuxostat (ULORIC) 40 MG tablet Take 40 mg by mouth daily., Until Discontinued, Historical Med    folic acid (FOLVITE) 1  MG tablet Take 1 mg by mouth daily., Until Discontinued, Historical Med    furosemide (LASIX) 40 MG tablet Take 40 mg by mouth daily., Until Discontinued, Historical Med    levothyroxine (SYNTHROID, LEVOTHROID) 50 MCG tablet Take 50 mcg by mouth daily before breakfast. , Until Discontinued, Historical Med    magnesium oxide (MAG-OX) 400 MG tablet  Take 400 mg by mouth daily., Until Discontinued, Historical Med    metoprolol tartrate (LOPRESSOR) 25 MG tablet Take 25 mg by mouth 2 (two) times daily., Until Discontinued, Historical Med    Multiple Vitamin (MULTIVITAMIN WITH MINERALS) TABS tablet Take 1 tablet by mouth daily., Until Discontinued, Historical Med    oxybutynin (DITROPAN-XL) 5 MG 24 hr tablet Take 5 mg by mouth at bedtime., Until Discontinued, Historical Med    polyethylene glycol (MIRALAX / GLYCOLAX) packet Take 17 g by mouth daily as needed for mild constipation. , Until Discontinued, Historical Med    thiamine (VITAMIN B-1) 100 MG tablet Take 100 mg by mouth daily., Until Discontinued, Historical Med    traZODone (DESYREL) 50 MG tablet Take 50 mg by mouth at bedtime as needed for sleep. , Until Discontinued, Historical Med      STOP taking these medications     acetaminophen (TYLENOL) 325 MG tablet      omeprazole (PRILOSEC) 20 MG capsule      potassium chloride (K-DUR,KLOR-CON) 10 MEQ tablet               Today   CHIEF COMPLAINT:  Patient says she drinks daily not interested in [place] wants to go home   VITAL SIGNS:  Blood pressure 142/81, pulse 66, temperature 97.6 F (36.4 C), temperature source Oral, resp. rate 18, height 5\' 4"  (1.626 m), weight 48.444 kg (106 lb 12.8 oz), SpO2 100 %.   REVIEW OF SYSTEMS:  Review of Systems  Constitutional: Negative for fever, chills and malaise/fatigue.  HENT: Negative for ear discharge, ear pain, hearing loss, nosebleeds and sore throat.   Eyes: Negative for blurred vision and pain.  Respiratory: Negative for cough, hemoptysis, shortness of breath and wheezing.   Cardiovascular: Negative for chest pain, palpitations and leg swelling.  Gastrointestinal: Negative for nausea, vomiting, abdominal pain, diarrhea and blood in stool.  Genitourinary: Negative for dysuria.  Musculoskeletal: Negative for back pain.  Neurological: Negative for dizziness, tremors,  speech change, focal weakness, seizures and headaches.  Endo/Heme/Allergies: Does not bruise/bleed easily.  Psychiatric/Behavioral: Positive for memory loss. Negative for depression, suicidal ideas and hallucinations.     PHYSICAL EXAMINATION:  GENERAL:  81 y.o.-year-old patient lying in the bed with no acute distress. frail NECK:  Supple, no jugular venous distention. No thyroid enlargement, no tenderness.  LUNGS: Normal breath sounds bilaterally, no wheezing, rales,rhonchi  No use of accessory muscles of respiration.  CARDIOVASCULAR: S1, S2 normal. No murmurs, rubs, or gallops.  ABDOMEN: Soft, non-tender, non-distended. Bowel sounds present. No organomegaly or mass.  EXTREMITIES: No pedal edema, cyanosis, or clubbing.  PSYCHIATRIC: The patient is alert and oriented x name and place not month  SKIN: No obvious rash, lesion, or ulcer.   DATA REVIEW:   CBC  Recent Labs Lab 11/16/15 0505  WBC 5.5  HGB 11.0*  HCT 32.5*  PLT 82*    Chemistries   Recent Labs Lab 11/14/15 1610  11/16/15 0505  NA 133*  < > 134*  K 4.1  < > 3.8  CL 105  < > 107  CO2 22  < > 22  GLUCOSE 98  < > 84  BUN 40*  < > 34*  CREATININE 3.17*  < > 2.62*  CALCIUM 8.6*  < > 7.6*  AST 34  --   --   ALT 21  --   --   ALKPHOS 53  --   --   BILITOT 0.6  --   --   < > = values in this interval not displayed.  Cardiac Enzymes  Recent Labs Lab 11/14/15 1610  TROPONINI <0.03    Microbiology Results  @[email or web address] @  RADIOLOGY:  US Abdomen Complete  11/16/2015  CLINICAL DATA:  Thrombocytopenia, low albumin EXAM: COMPLETE ABDOMINAL ULTRASOUND US HEPATIC DOPPLER ARTERIAL/VENOUS ULTRASOUND COMPARISON:  12/07/2014 and previous FINDINGS: Gallbladder: Incompletely distended. Thickened wall up to 9.1 mm with striations and a trace amount of pericholecystic fluid. Small calculi layering in the dependent aspect measuring up to 5 mm diameter. Sonographer reports no sonographic Murphy's sign. Common bile duct:   Normal in caliber, 5.1mm diameter. Liver: Homogeneous in echotexture without focal lesion or intrahepatic bile duct dilatation. Portal Vein 8.8 diameter. No evidence of occlusion or thrombus. Velocities (all hepatopetal): Main:  25-39 cm/sec Right: 26 cm/sec Left:  23 cm/sec Hepatic Vein Velocities (all hepatofugal) Right:  55 cm/sec Middle:  42 cm/sec Left:  37 cm/sec Hepatic Artery Velocity: 50 cm/sec IVC:  Patent, 33 cm/seconds flow velocity Pancreas: Visualized segments unremarkable, portions obscured by overlying bowel gas. Spleen:  No focal lesion, 4.4 x 6.9 x 3.2 cm.  (volume = 50.5 cc) Splenic Vein Velocity: 9.4 cm/sec. No evidence of occlusion or thrombus. Right Kidney: No hydronephrosis, isoechoic to the adjacent liver. 8 x 7 x 9 mm partially exophytic cyst from the interpolar region. Hypoechoic 14 x 12 x 11 mm lesion from the lower pole. 9.6Cm in length. Left Kidney: Septated 23 x 19 x 24 mm hypoechoic lesion in the lower pole. No hydronephrosis. 9.9Cm in length. Abdominal aorta:  Negative Varices: None seen Ascites: None seen Right pleural effusion is noted. IMPRESSION: 1. Unremarkable liver with normal vascular Doppler evaluation. 2. Cholelithiasis with gallbladder wall thickening. 3. Bilateral renal lesions, inconclusively characterized on this ultrasound study. Further evaluation with pre and post contrast MRI or CT should be considered. MRI is preferred in younger patients (due to lack of ionizing radiation) and for evaluating calcified lesion(s). 4. Right pleural effusion. Electronically Signed   By: Lucrezia Europe M.D.   On: 11/16/2015 10:59   [place] Art/ven Flow Abd Pelv Doppler  11/16/2015  CLINICAL DATA:  Thrombocytopenia, low albumin EXAM: COMPLETE ABDOMINAL ULTRASOUND US HEPATIC DOPPLER ARTERIAL/VENOUS ULTRASOUND COMPARISON:  12/07/2014 and previous FINDINGS: Gallbladder: Incompletely distended. Thickened wall up to 9.1 mm with striations and a trace amount of pericholecystic fluid. Small calculi  layering in the dependent aspect measuring up to 5 mm diameter. Sonographer reports no sonographic Murphy's sign. Common bile duct:  Normal in caliber, 5.83mm diameter. Liver: Homogeneous in echotexture without focal lesion or intrahepatic bile duct dilatation. Portal Vein 8.8 diameter. No evidence of occlusion or thrombus. Velocities (all hepatopetal): Main:  25-39 cm/sec Right: 26 cm/sec Left:  23 cm/sec Hepatic Vein Velocities (all hepatofugal) Right:  55 cm/sec Middle:  42 cm/sec Left:  37 cm/sec Hepatic Artery Velocity: 50 cm/sec IVC:  Patent, 33 cm/seconds flow velocity Pancreas: Visualized segments unremarkable, portions obscured by overlying bowel gas. Spleen:  No focal lesion, 4.4 x 6.9 x 3.2 cm.  (volume = 50.5 cc) Splenic Vein Velocity: 9.4 cm/sec. No evidence of occlusion or thrombus. Right Kidney: No  hydronephrosis, isoechoic to the adjacent liver. 8 x 7 x 9 mm partially exophytic cyst from the interpolar region. Hypoechoic 14 x 12 x 11 mm lesion from the lower pole. 9.6Cm in length. Left Kidney: Septated 23 x 19 x 24 mm hypoechoic lesion in the lower pole. No hydronephrosis. 9.9Cm in length. Abdominal aorta:  Negative Varices: None seen Ascites: None seen Right pleural effusion is noted. IMPRESSION: 1. Unremarkable liver with normal vascular Doppler evaluation. 2. Cholelithiasis with gallbladder wall thickening. 3. Bilateral renal lesions, inconclusively characterized on this ultrasound study. Further evaluation with pre and post contrast MRI or CT should be considered. MRI is preferred in younger patients (due to lack of ionizing radiation) and for evaluating calcified lesion(s). 4. Right pleural effusion. Electronically Signed   By: Lucrezia Europe M.D.   On: 11/16/2015 10:59      Management plans discussed with the patient's son and he is in agreement. Stable for discharge home with [facility]  Patient should follow up with PCP  CODE STATUS:     Code Status Orders        Start     Ordered    11/14/15 2013  Full code   Continuous     11/14/15 2012    Code Status History    Date Active Date Inactive Code Status Order ID Comments User Context   10/14/2015  8:48 AM 10/16/2015  8:33 PM Full Code [id number]  [name], MD Inpatient   08/24/2015 11:18 PM 08/28/2015  7:38 PM Full Code [id number]  Hillary Bow, MD ED   12/05/2014  8:51 PM 12/08/2014 10:13 PM Full Code [id number]  Dustin Flock, MD Inpatient   11/03/2014  9:43 AM 11/08/2014 11:46 PM Full Code [id number]  Harrie Foreman, MD Inpatient    Advance Directive Documentation        Most Recent Value   Type of Advance Directive  Healthcare Power of Attorney [[place]]   Pre-existing out of facility DNR order (yellow form or pink MOST form)     "MOST" Form in Place?        TOTAL TIME TAKING CARE OF THIS PATIENT: 37 minutes.    Note: This dictation was prepared with Dragon dictation along with smaller phrase technology. Any transcriptional errors that result from this process are unintentional.  Luwanna Brossman M.D on 11/16/2015 at 7:29 PM  Between 7am to 6pm - Pager - [phone] After 6pm go to [email or web address] - password [email or web address] [facility]  Office  [phone]  CC: Primary care physician; Marguerita Merles, MD

## 2015-11-16 NOTE — Consult Note (Signed)
Palliative Medicine Inpatient Consult Note   Name: [name] Holmes Date: 11/16/2015 MRN: [id number]  DOB: Feb 15, 1935  Referring Physician: Bettey Costa, MD  Palliative Care consult requested for this 81 y.o. female for goals of medical therapy in patient with alcoholism, anemia, gout, CKD, and h/o GI bleeding.  She presented here with the active problems of weakness and anemia and not eating well.  She was found to also have a UTI which is treated.  Her anemia is a chronic problem and has been worked up as much as can be at this time (refer to consulting notes from recent encounters).   PLAN: Pt is being discharged at this time.   Grandson was upset that he didn't find her at the facility ([facility]??) where he says she was placed a few weeks ago.  He did not know she had been sent home from there with Home Health.   Pt cannot provide me with any goals of care input and this is not the time to address these issues.  Pt is severely malnourished --likely from alcoholism.  Daughter lives nearby but only grandson is here to pick pt up.   I was not able to talk with her about any subject matter. She only wants a drink.  Pt is to be DCd to her daughter's home per grandson.   REVIEW OF SYSTEMS:  Not reliable.  Will not talk about anything except getting a drink.   SPIRITUAL SUPPORT SYSTEM: Yes ?  Pt has family, but apparently they are not all 'up' on the latest with her. The pt's grandson went to visit her at a facility today and they told him that she had recently been discharged home.  Pt had Home Health at home recently.  Yolanda Bonine is upset that she was discharged after being there a couple of weeks. He says that she was at home when she got put into the facility and he didn't think she would be leaving there.  .  SOCIAL HISTORY:  reports that she has quit smoking. Her smoking use included Cigarettes. She has never used smokeless tobacco. She reports that she drinks about 7.2 oz of alcohol per week. She  reports that she does not use illicit drugs.  LEGAL DOCUMENTS:  none  CODE STATUS: Full code status is by default --unable to discuss with pt.  PAST MEDICAL HISTORY: Past Medical History  Diagnosis Date  . Arthritis   . Hypertension   . Anemia of chronic renal failure   . Gout   . ETOH abuse   . CKD (chronic kidney disease)     PAST SURGICAL HISTORY:  Past Surgical History  Procedure Laterality Date  . Abdominal hysterectomy    . Esophagogastroduodenoscopy (egd) with propofol N/A 11/08/2014    Procedure: ESOPHAGOGASTRODUODENOSCOPY (EGD) WITH PROPOFOL;  Surgeon: Lollie Sails, MD;  Location: [facility] ENDOSCOPY;  Service: Endoscopy;  Laterality: N/A;  . Colonoscopy with propofol N/A 12/08/2014    Procedure: COLONOSCOPY WITH PROPOFOL;  Surgeon: Josefine Class, MD;  Location: [facility] ENDOSCOPY;  Service: Endoscopy;  Laterality: N/A;  . Givens capsule study N/A 08/26/2015    Procedure: GIVENS CAPSULE STUDY;  Surgeon: Josefine Class, MD;  Location: [facility] ENDOSCOPY;  Service: Endoscopy;  Laterality: N/A;    ALLERGIES:  has No Known Allergies.  MEDICATIONS:  Current Facility-Administered Medications  Medication Dose Route Frequency Provider Last Rate Last Dose  . 0.9 %  sodium chloride infusion   Intravenous Continuous Vaughan Basta, MD 75 mL/hr at 11/15/15 0250    .  allopurinol (ZYLOPRIM) tablet 100 mg  100 mg Oral Daily Vaughan Basta, MD   100 mg at 11/16/15 1027  . cefTRIAXone (ROCEPHIN) 1 g in dextrose 5 % 50 mL IVPB  1 g Intravenous Q24H Vaughan Basta, MD   1 g at 11/16/15 0240  . cholecalciferol (VITAMIN D) tablet 1,000 Units  1,000 Units Oral Daily Vaughan Basta, MD   1,000 Units at 11/16/15 1027  . febuxostat (ULORIC) tablet 40 mg  40 mg Oral Daily Vaughan Basta, MD   40 mg at 11/16/15 1027  . feeding supplement (ENSURE ENLIVE) (ENSURE ENLIVE) liquid 237 mL  237 mL Oral BID BM Sital Mody, MD   237 mL at 11/16/15 1445  . folic acid  (FOLVITE) tablet 1 mg  1 mg Oral Daily Vaughan Basta, MD   1 mg at 11/16/15 1027  . levothyroxine (SYNTHROID, LEVOTHROID) tablet 50 mcg  50 mcg Oral QAC breakfast Vaughan Basta, MD   50 mcg at 11/16/15 1027  . magnesium oxide (MAG-OX) tablet 400 mg  400 mg Oral Daily Vaughan Basta, MD   400 mg at 11/16/15 1027  . metoprolol tartrate (LOPRESSOR) tablet 25 mg  25 mg Oral BID Vaughan Basta, MD   25 mg at 11/16/15 1027  . multivitamin with minerals tablet 1 tablet  1 tablet Oral Daily Vaughan Basta, MD   1 tablet at 11/16/15 1027  . oxybutynin (DITROPAN-XL) 24 hr tablet 5 mg  5 mg Oral QHS Vaughan Basta, MD   5 mg at 11/15/15 2028  . pantoprazole (PROTONIX) injection 40 mg  40 mg Intravenous Q12H Vaughan Basta, MD   40 mg at 11/16/15 1028  . thiamine (VITAMIN B-1) tablet 100 mg  100 mg Oral Daily Vaughan Basta, MD   100 mg at 11/16/15 1027  . traZODone (DESYREL) tablet 50 mg  50 mg Oral QHS PRN Vaughan Basta, MD        Vital Signs: BP 142/81 mmHg  Pulse 66  Temp(Src) 97.6 F (36.4 C) (Oral)  Resp 18  Ht 5\' 4"  (1.626 m)  Wt 48.444 kg (106 lb 12.8 oz)  BMI 18.32 kg/m2  SpO2 100% Filed Weights   11/14/15 1611 11/14/15 2012  Weight: 58.968 kg (130 lb) 48.444 kg (106 lb 12.8 oz)    Estimated body mass index is 18.32 kg/(m^2) as calculated from the following:   Height as of this encounter: 5\' 4"  (1.626 m).   Weight as of this encounter: 48.444 kg (106 lb 12.8 oz).  PERFORMANCE STATUS (ECOG) : 1 - Symptomatic but completely ambulatory  PHYSICAL EXAM: Lying in medical bed, asking her grandson to go get that bottle of liquor that she brought to the hospital with her --telling him to go to the hall and get it. She refuses to talk about anything else except getting a drink --saying she hasn't had a drink since she got here. EOMI OP clear No JVD or TM Hrt rrr no m Lungs cta Abd soft and NT Ext no cyanosis or  mottling  LABS: CBC:    Component Value Date/Time   WBC 5.5 11/16/2015 0505   WBC 9.3 06/06/2014 1934   HGB 11.0* 11/16/2015 0505   HGB 9.1* 06/06/2014 1934   HCT 32.5* 11/16/2015 0505   HCT 28.0* 06/06/2014 1934   PLT 82* 11/16/2015 0505   PLT 225 06/06/2014 1934   MCV 89.3 11/16/2015 0505   MCV 98 06/06/2014 1934   NEUTROABS 4.7 11/14/2015 1610   NEUTROABS 5.6 04/07/2014 0104  LYMPHSABS 1.3 11/14/2015 1610   LYMPHSABS 0.8* 04/07/2014 0104   MONOABS 0.4 11/14/2015 1610   MONOABS 0.3 04/07/2014 0104   EOSABS 0.1 11/14/2015 1610   EOSABS 0.0 04/07/2014 0104   BASOSABS 0.0 11/14/2015 1610   BASOSABS 0.1 04/07/2014 0104   Comprehensive Metabolic Panel:    Component Value Date/Time   NA 134* 11/16/2015 0505   NA 141 06/06/2014 1934   K 3.8 11/16/2015 0505   K 5.1 06/06/2014 1934   CL 107 11/16/2015 0505   CL 104 06/06/2014 1934   CO2 22 11/16/2015 0505   CO2 22 06/06/2014 1934   BUN 34* 11/16/2015 0505   BUN 22* 06/06/2014 1934   CREATININE 2.62* 11/16/2015 0505   CREATININE 1.38* 06/06/2014 1934   GLUCOSE 84 11/16/2015 0505   GLUCOSE 73 06/06/2014 1934   CALCIUM 7.6* 11/16/2015 0505   CALCIUM 7.8* 06/06/2014 1934   AST 34 11/14/2015 1610   AST 53* 06/06/2014 1934   ALT 21 11/14/2015 1610   ALT 25 06/06/2014 1934   ALKPHOS 53 11/14/2015 1610   ALKPHOS 71 06/06/2014 1934   BILITOT 0.6 11/14/2015 1610   BILITOT 0.4 06/06/2014 1934   PROT 6.2* 11/14/2015 1610   PROT 6.9 06/06/2014 1934   ALBUMIN 2.1* 11/14/2015 1610   ALBUMIN 2.4* 06/06/2014 1934     More than 50% of the visit was spent in counseling/coordination of care: Yes  Time Spent: 40 minutes

## 2015-11-16 NOTE — Care Management Important Message (Signed)
Important Message  Patient Details  Name: [name] Holmes MRN: [id number] Date of Birth: May 09, 1935   Medicare Important Message Given:  Yes    Katrina Stack, RN 11/16/2015, 9:41 AM

## 2015-11-16 NOTE — Care Management (Signed)
Patient remains insistent on returning home.  Her daughter will transport. Orders received for resumption of home health thru [facility] PT A[facility] and SW.  Agency aware.

## 2015-11-16 NOTE — Progress Notes (Signed)
[place] [facility] [place] NAME: Lashanda Amrhein    MR#:  [id number]  DATE OF BIRTH:  10-25-1934  SUBJECTIVE:   patient states she drink EtOh almost daily No signs of withdrawal REVIEW OF SYSTEMS:    Review of Systems  Constitutional: Negative for fever, chills and malaise/fatigue.  HENT: Negative for ear discharge, ear pain, hearing loss, nosebleeds and sore throat.   Eyes: Negative for blurred vision and pain.  Respiratory: Negative for cough, hemoptysis, shortness of breath and wheezing.   Cardiovascular: Negative for chest pain, palpitations and leg swelling.  Gastrointestinal: Negative for nausea, vomiting, abdominal pain and diarrhea.  Genitourinary: Negative for dysuria.  Musculoskeletal: Negative for back pain.  Neurological: Positive for weakness. Negative for dizziness, tremors, speech change, focal weakness, seizures and headaches.  Endo/Heme/Allergies: Does not bruise/bleed easily.  Psychiatric/Behavioral: Positive for memory loss. Negative for depression, suicidal ideas and hallucinations.    Tolerating Diet:yes      DRUG ALLERGIES:  No Known Allergies  VITALS:  Blood pressure 147/69, pulse 65, temperature 97.9 F (36.6 C), temperature source Oral, resp. rate 18, height 5\' 4"  (1.626 m), weight 48.444 kg (106 lb 12.8 oz), SpO2 100 %.  PHYSICAL EXAMINATION:   Physical Exam  Constitutional: She is well-developed, well-nourished, and in no distress. No distress.  HENT:  Head: Normocephalic.  Eyes: No scleral icterus.  Neck: Normal range of motion. Neck supple. No JVD present. No tracheal deviation present.  Cardiovascular: Normal rate, regular rhythm and normal heart sounds.  Exam reveals no gallop and no friction rub.   No murmur heard. Pulmonary/Chest: Effort normal and breath sounds normal. No respiratory distress. She has no wheezes. She has no rales. She exhibits no tenderness.  Abdominal: Soft. Bowel sounds are normal.  She exhibits no distension and no mass. There is no tenderness. There is no rebound and no guarding.  Musculoskeletal: Normal range of motion. She exhibits no edema.  Neurological: She is alert.  To name and place  Skin: Skin is warm. No rash noted. No erythema.  Psychiatric: Affect normal.      LABORATORY PANEL:   CBC  Recent Labs Lab 11/16/15 0505  WBC 5.5  HGB 11.0*  HCT 32.5*  PLT 82*   ------------------------------------------------------------------------------------------------------------------  Chemistries   Recent Labs Lab 11/14/15 1610  11/16/15 0505  NA 133*  < > 134*  K 4.1  < > 3.8  CL 105  < > 107  CO2 22  < > 22  GLUCOSE 98  < > 84  BUN 40*  < > 34*  CREATININE 3.17*  < > 2.62*  CALCIUM 8.6*  < > 7.6*  AST 34  --   --   ALT 21  --   --   ALKPHOS 53  --   --   BILITOT 0.6  --   --   < > = values in this interval not displayed. ------------------------------------------------------------------------------------------------------------------  Cardiac Enzymes  Recent Labs Lab 11/14/15 1610  TROPONINI <0.03   ------------------------------------------------------------------------------------------------------------------  RADIOLOGY:  Dg Chest 2 View  11/14/2015  CLINICAL DATA:  Altered mental status. EXAM: CHEST  2 VIEW COMPARISON:  Nov 03, 2014. FINDINGS: Stable cardiomediastinal silhouette. No pneumothorax or pleural effusion is noted. Both lungs are clear. The visualized skeletal structures are unremarkable. IMPRESSION: No active cardiopulmonary disease. Electronically Signed   By: Marijo Conception, M.D.   On: 11/14/2015 16:58   Ct Head Wo Contrast  11/14/2015  CLINICAL DATA:  Generalized  weakness. EXAM: CT HEAD WITHOUT CONTRAST TECHNIQUE: Contiguous axial images were obtained from the base of the skull through the vertex without intravenous contrast. COMPARISON:  Nov 03, 2014 FINDINGS: There is chronic diffuse atrophy. Chronic bilateral  periventricular white matter small vessel ischemic changes identified. There is no midline shift, hydrocephalus, or mass. No acute hemorrhage or acute transcortical infarct is identified. The bony calvarium is intact. The visualized sinuses are clear. IMPRESSION: No focal acute intra cranial abnormality identified. Chronic diffuse atrophy and chronic bilateral periventricular white matter small vessel ischemic change. Electronically Signed   By: Abelardo Diesel M.D.   On: 11/14/2015 16:42     ASSESSMENT AND PLAN:    81 year old female with a history of essential hypertension and chronic kidney disease stage IV who was sent to the emergency room with confusion and weakness and found to have hemoglobin of 6.6.   1. Acute on chronic blood loss anemia with guaic positive stool: She has had GI workup in the past for anemia without localization of the source. Chronic anemia is likely due to chronic kidney disease and EtOH abuse. Status post 2 unit PRBC. hgb stable this am Continue PPI. F/u abdominal ultrasound Patient can follow-up as an outpatient with oncology/hematology if needed.   2. Altered mental status with Urinary tract infection: Continue ceftriaxone and urine culture is greater than 100,000 Escherichia coli.  3. Acute on chronic kidney disease stage IV: Creatinine is near baseline.  Continue IV fluids and hold nephrotoxic agents.   4. Essential hypertension: Continue metoprolol.  5. Hypothyroid: Continue Synthroid.  TSH within normal limits.   6. Severe protein calorie malnutrition: Palliative care consult.  Ann sure twice a day   Management plans discussed with the patient and she is in agreement.  CODE STATUS: FULL  TOTAL TIME TAKING CARE OF THIS PATIENT: 24 minutes.     POSSIBLE D/C today, DEPENDING ON abdominal ultrasound and PT evaluation  Neftaly Swiss M.D on 11/16/2015 at 10:53 AM  Between 7am to 6pm - Pager - [phone] After 6pm go to [email or web address] - password  [facility]  Office  [phone]  CC: Primary care physician; Marguerita Merles, MD  Note: This dictation was prepared with Dragon dictation along with smaller phrase technology. Any transcriptional errors that result from this process are unintentional.

## 2015-11-16 NOTE — Evaluation (Signed)
Physical Therapy Evaluation Patient Details Name: [name] Holmes MRN: [id number] DOB: [birth year] Today's Date: 11/16/2015   History of Present Illness  Pt is a 81 y.o. female presenting to hospital with weakness and AMS.  Pt found to have low Hgb of 6.6 with guiac (+) stools.  Pt admited with acute on chronic blood loss anemia d/t GI bleed.  Pt s/p PRBC transfusion.  Of noted, pt with 3 admits in past 6 months.  PMH includes arthritis, htn, gout, ETOH abuse, CKD, GI bleed, CKD IV.  Clinical Impression  Prior to admission, pt reports being modified independent ambulating with cane vs RW depending on how she feels.  Pt lives alone in 1 level apt with an aide M-F 8-11 AM and some assist from family.  Currently pt is SBA supine to sit and CGA with transfers and ambulation 40 feet with RW.  Pt requiring encouragement to participate and increased effort required by pt to perform activities.  Pt would benefit from skilled PT to address noted impairments and functional limitations.  Recommend pt discharge to home with assist for all functional mobility for safety, use of RW, and HHPT when medically appropriate.     Follow Up Recommendations Home health PT;Supervision - Intermittent;Supervision for mobility/OOB    Equipment Recommendations   (pt reports owning RW)    Recommendations for Other Services       Precautions / Restrictions Precautions Precautions: Fall Restrictions Weight Bearing Restrictions: No      Mobility  Bed Mobility Overal bed mobility: Needs Assistance Bed Mobility: Supine to Sit     Supine to sit: Supervision;HOB elevated     General bed mobility comments: significant increased time to perform (pt requiring encouragement to get up d/t pt wanting to lay back down and rest)  Transfers Overall transfer level: Needs assistance Equipment used: Rolling walker (2 wheeled) Transfers: Sit to/from Stand Sit to Stand: Min guard         General transfer comment: pt  requiring encouragement to stand; increased effort noted by pt  Ambulation/Gait Ambulation/Gait assistance: Min guard Ambulation Distance (Feet): 40 Feet Assistive device: Rolling walker (2 wheeled)   Gait velocity: decreased   General Gait Details: pt required intermittent vc's to stay closer to RW but otherwise pt steady without loss of balance; decreased B step length/foot clearance/heelstrike  Stairs            Wheelchair Mobility    Modified Rankin (Stroke Patients Only)       Balance Overall balance assessment: Needs assistance Sitting-balance support: Bilateral upper extremity supported;Feet supported Sitting balance-Leahy Scale: Good     Standing balance support: Bilateral upper extremity supported (on RW) Standing balance-Leahy Scale: Good                               Pertinent Vitals/Pain Pain Assessment: No/denies pain  Vitals stable and WFL throughout treatment session.    Home Living Family/patient expects to be discharged to:: Private residence Living Arrangements: Alone Available Help at Discharge: Home health;Family Type of Home: Apartment Home Access: Level entry     Home Layout: One level Home Equipment: Walker - 2 wheels;Grab bars - toilet;Shower seat;Cane - single point Additional Comments: Pt reports she has an aide that comes 8-11 am M-F to assist with meal prep, ADL's, and household activities.  Niece assists on weekends and checks on pt daily.  Pt's daughter lives in apt above her and assists as  needed.    Prior Function Level of Independence: Independent with assistive device(s)         Comments: Pt uses RW vs cane depending on how she feels.  Pt denies any falls in past 6 months.     Hand Dominance        Extremity/Trunk Assessment   Upper Extremity Assessment: Generalized weakness (pt did not follow instructions for assessment d/t wanting to rest)           Lower Extremity Assessment: Generalized  weakness (pt did not follow instructions for assessment d/t wanting to rest)         Communication   Communication: No difficulties  Cognition Arousal/Alertness: Awake/alert Behavior During Therapy: WFL for tasks assessed/performed Overall Cognitive Status:  (Oriented to person; did not answer questions regarding time, place, situation.)                      General Comments   Nursing cleared pt for participation in physical therapy.  Pt agreeable to PT session with encouragement.    Exercises        Assessment/Plan    PT Assessment Patient needs continued PT services  PT Diagnosis Difficulty walking;Generalized weakness   PT Problem List Decreased strength;Decreased mobility;Decreased activity tolerance;Decreased balance  PT Treatment Interventions DME instruction;Gait training;Functional mobility training;Therapeutic activities;Therapeutic exercise;Balance training;Patient/family education   PT Goals (Current goals can be found in the Care Plan section) Acute Rehab PT Goals Patient Stated Goal: to go to sleep and rest PT Goal Formulation: With patient Time For Goal Achievement: 11/30/15 Potential to Achieve Goals: Good    Frequency Min 2X/week   Barriers to discharge  (Level of assist?)      Co-evaluation               End of Session Equipment Utilized During Treatment: Gait belt Activity Tolerance: Patient tolerated treatment well Patient left: in chair;with call bell/phone within reach;with chair alarm set Nurse Communication: Mobility status;Precautions         Time: 1113-1140 PT Time Calculation (min) (ACUTE ONLY): 27 min   Charges:   PT Evaluation $PT Eval Low Complexity: 1 Procedure PT Treatments $Therapeutic Activity: 8-22 mins   PT G CodesLeitha Bleak 12-02-2015, 12:22 PM Leitha Bleak, [place]

## 2015-11-16 NOTE — Progress Notes (Signed)
Patient discharged home with home health. VSS. All discharge instructions given to patient and grandson and all questions answered. Both verbalized understanding of discharge instructions.

## 2015-11-18 LAB — URINE CULTURE

## 2016-06-01 IMAGING — CT CT HEAD W/O CM
1 series · 16 of 30 positions shown, 20 images · non-contrast
Comparison: November 03, 2014

CLINICAL DATA: Generalized weakness.

EXAM:
CT HEAD WITHOUT CONTRAST
TECHNIQUE: Contiguous axial images were obtained from the base of the skull
through the vertex without intravenous contrast.

[Series 2: head wo · axial · 0.41mm/px · z∈[+265,+396]mm · 16 of 30 slices shown, 20 images]
[im 2/30  brain]
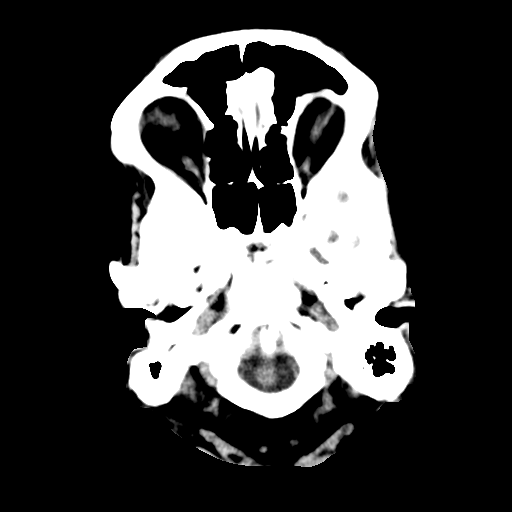
[im 2/30  bone]
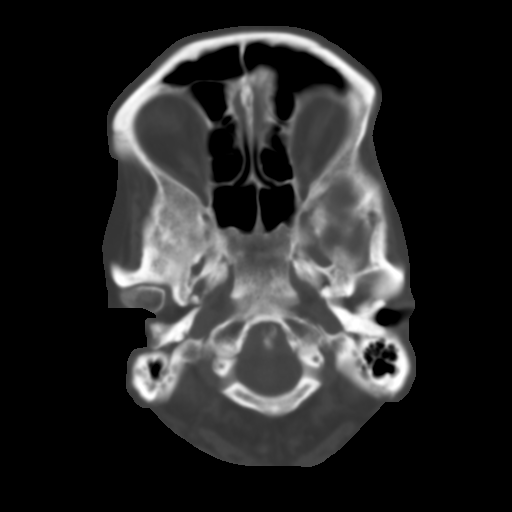
[im 4/30  brain]
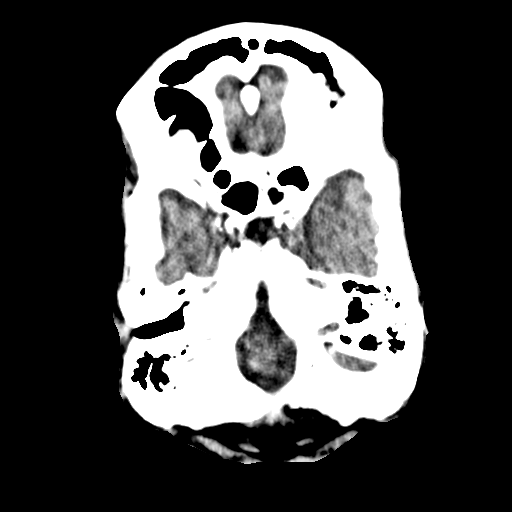
[im 6/30  brain]
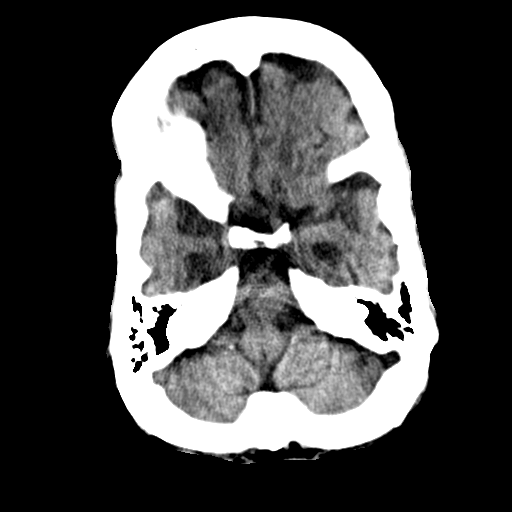
[im 8/30  brain]
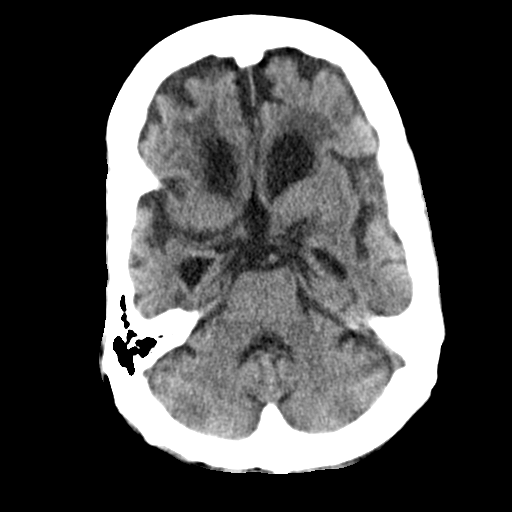
[im 9/30  brain]
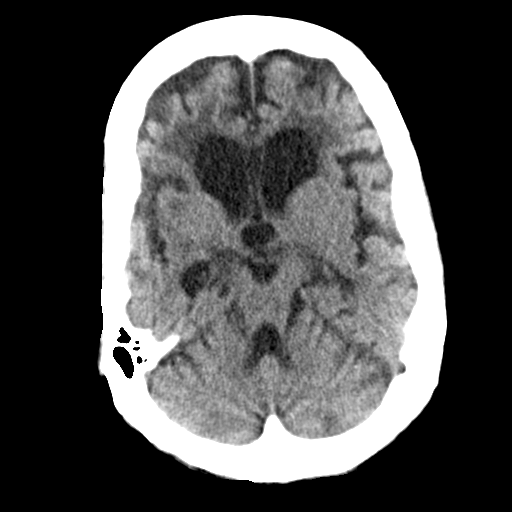
[im 9/30  bone]
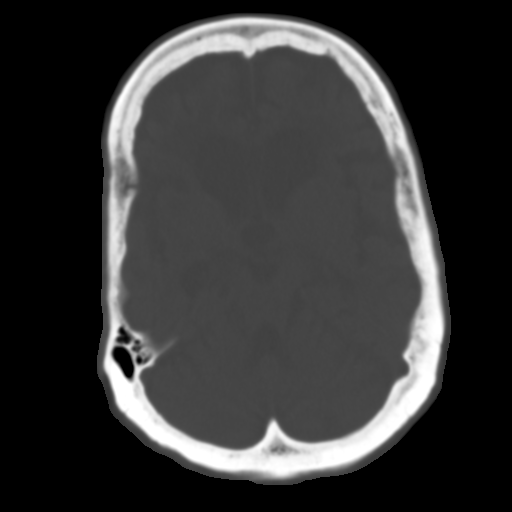
[im 11/30  brain]
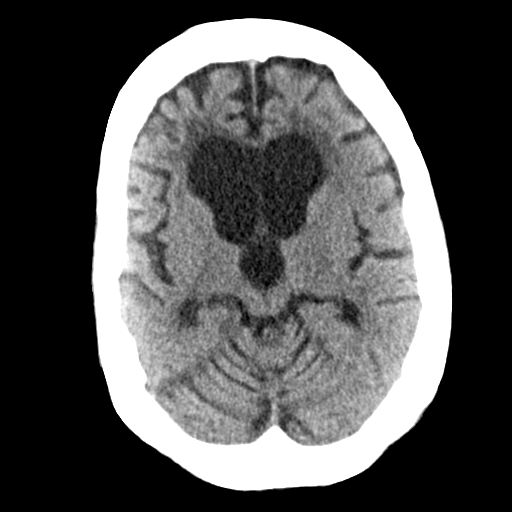
[im 13/30  brain]
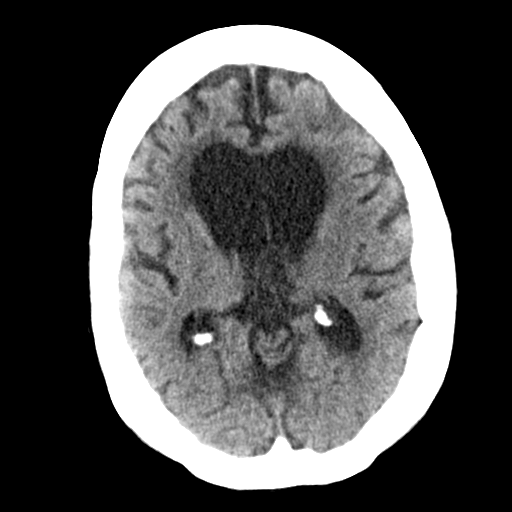
[im 15/30  brain]
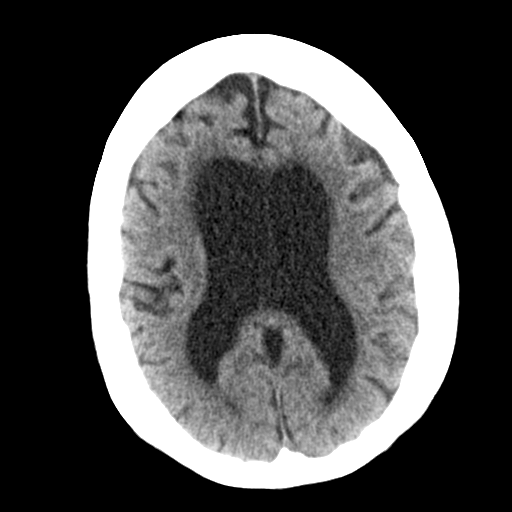
[im 16/30  brain]
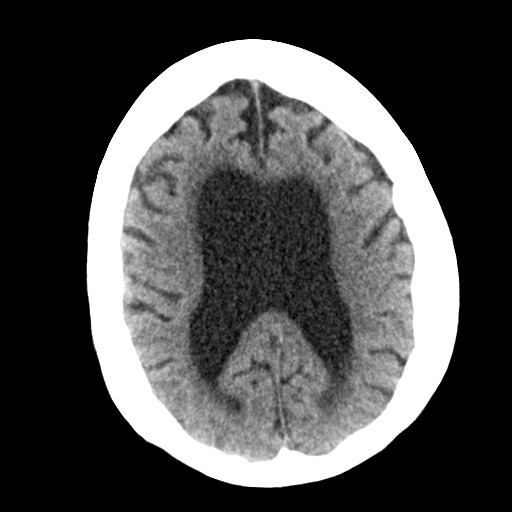
[im 16/30  bone]
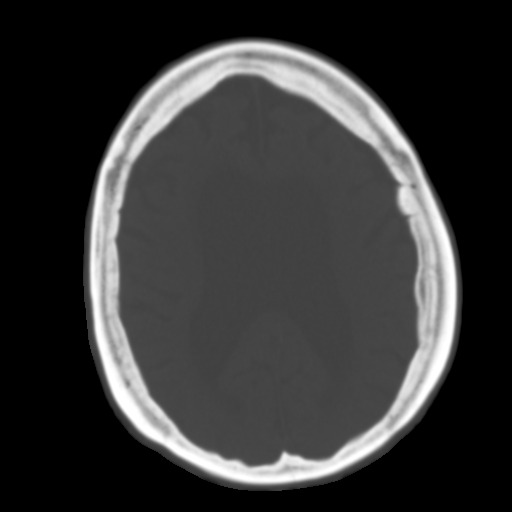
[im 18/30  brain]
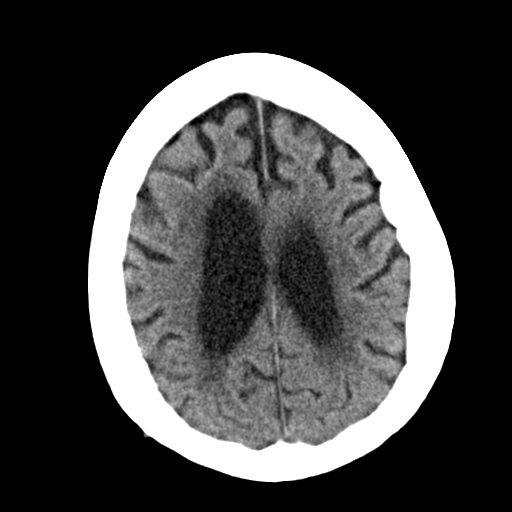
[im 20/30  brain]
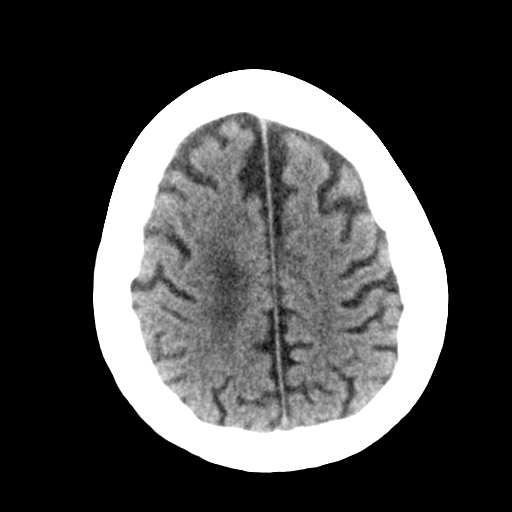
[im 22/30  brain]
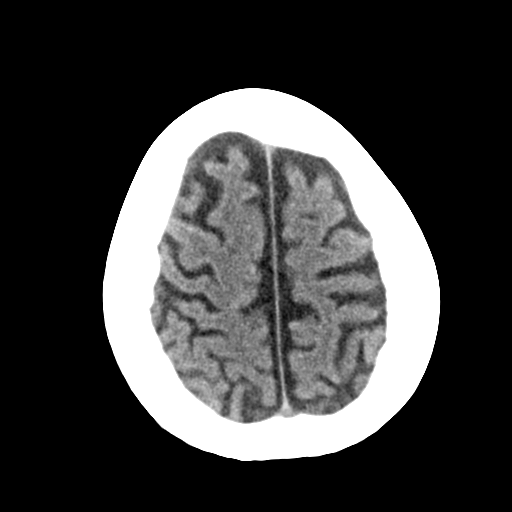
[im 23/30  brain]
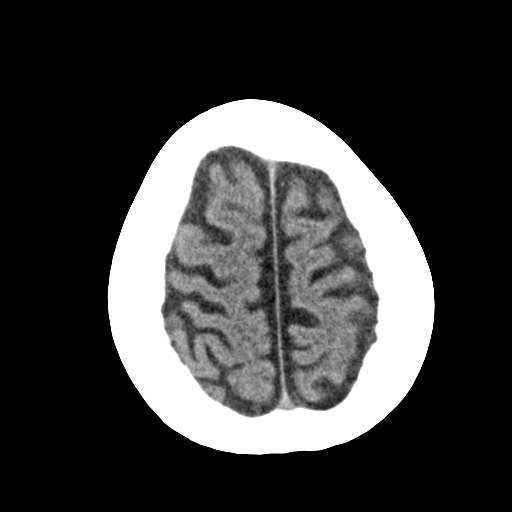
[im 23/30  bone]
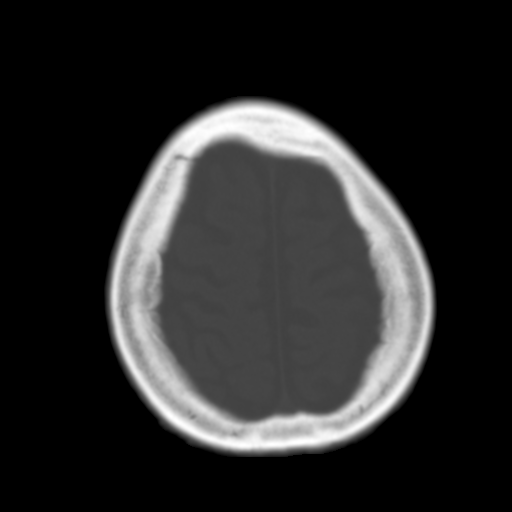
[im 25/30  brain]
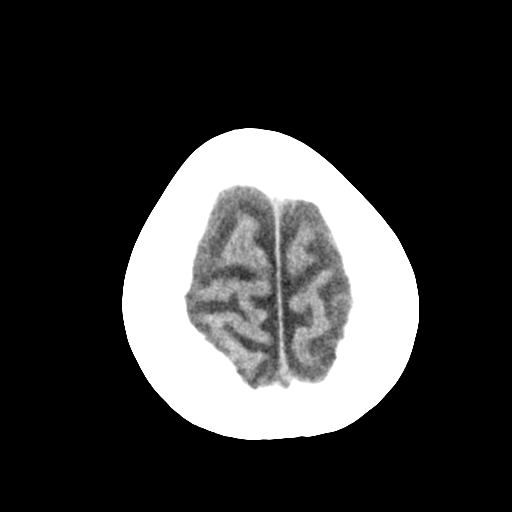
[im 27/30  brain]
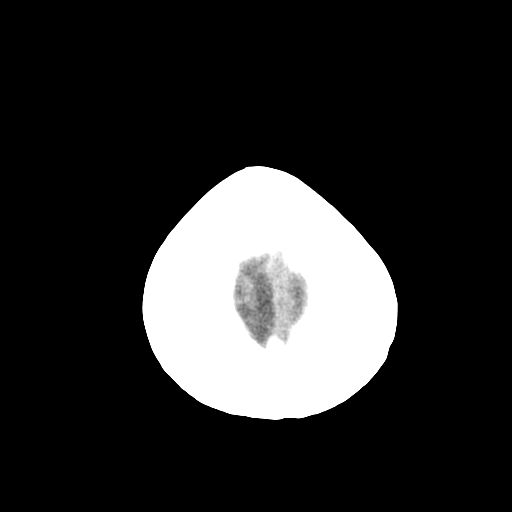
[im 29/30  brain]
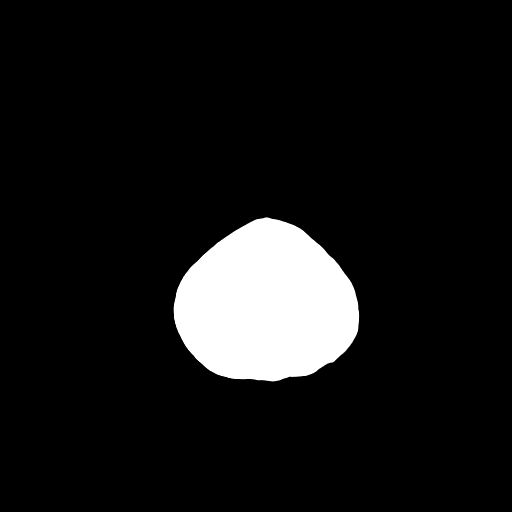

[16 of 30 positions shown; findings below may reference images not displayed]

FINDINGS: There is chronic diffuse atrophy. Chronic bilateral periventricular
white matter small vessel ischemic changes identified. There is no
midline shift, hydrocephalus, or mass. No acute hemorrhage or acute
transcortical infarct is identified. The bony calvarium is intact.
The visualized sinuses are clear.
IMPRESSION: No focal acute intra cranial abnormality identified.

Chronic diffuse atrophy and chronic bilateral periventricular white
matter small vessel ischemic change.

## 2016-06-03 IMAGING — US US ABDOMEN COMPLETE
2 of 3 series · 13 of 25 positions shown · non-contrast
Comparison: 12/07/2014 and previous

CLINICAL DATA: Thrombocytopenia, low albumin

EXAM:
COMPLETE ABDOMINAL ULTRASOUND
US HEPATIC DOPPLER ARTERIAL/VENOUS ULTRASOUND

[Series 1: us abdomen complete · 0.15mm/px · 12 of 141 slices shown (1 of 2)]
[im 1/141]
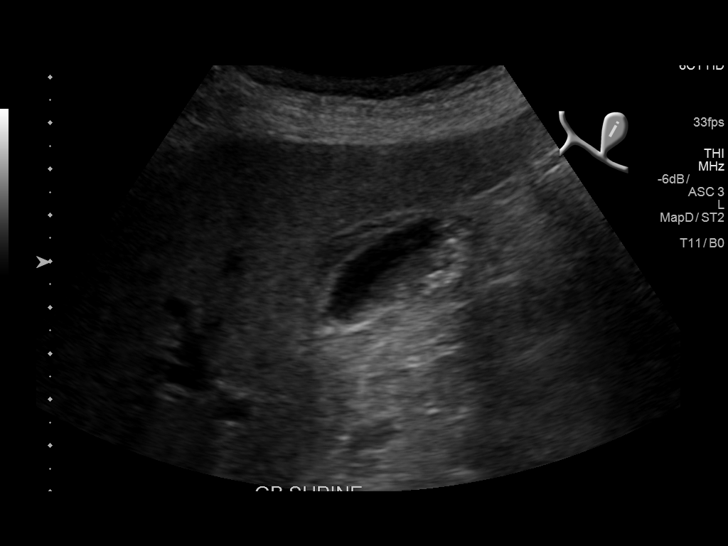
[im 13/141]
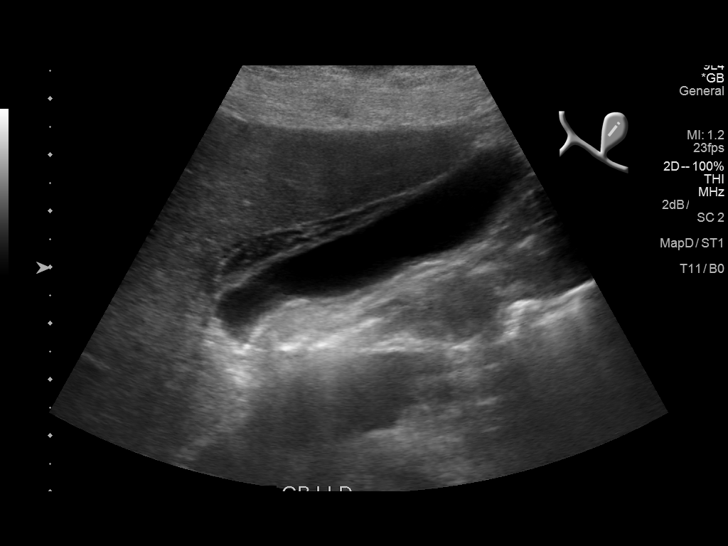
[im 26/141]
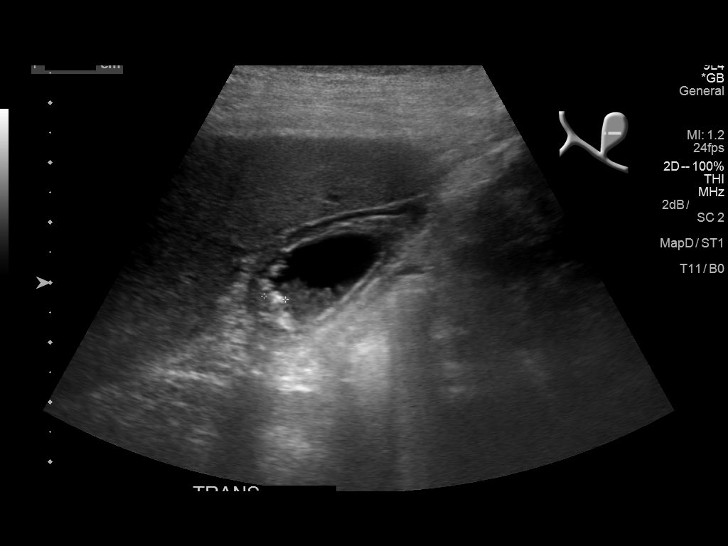
[im 39/141]
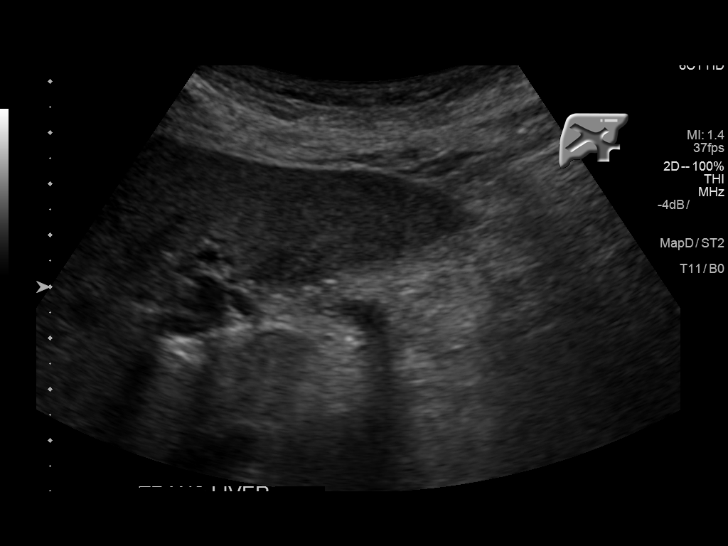
[im 51/141]
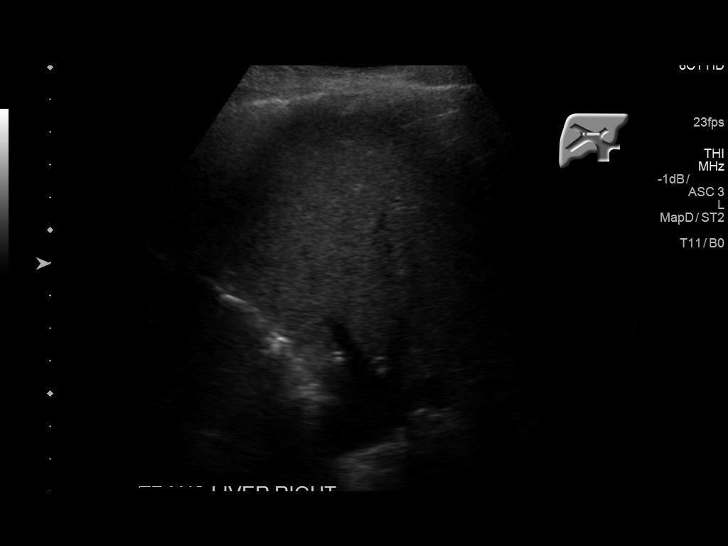
[im 64/141]
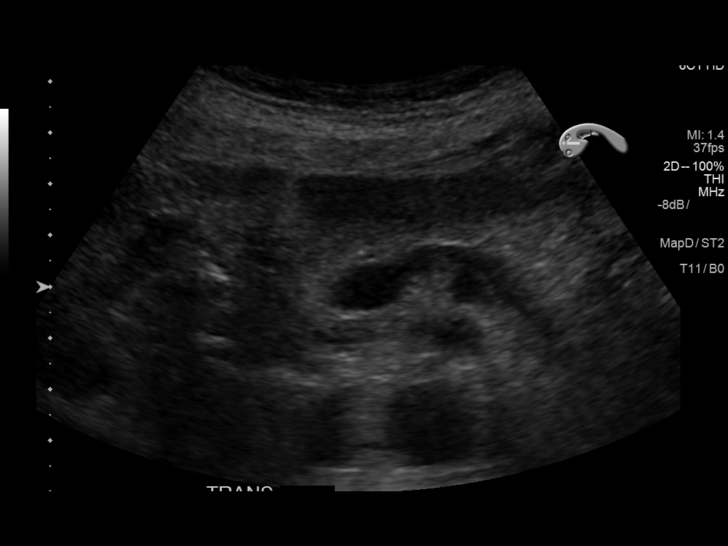
[im 77/141]
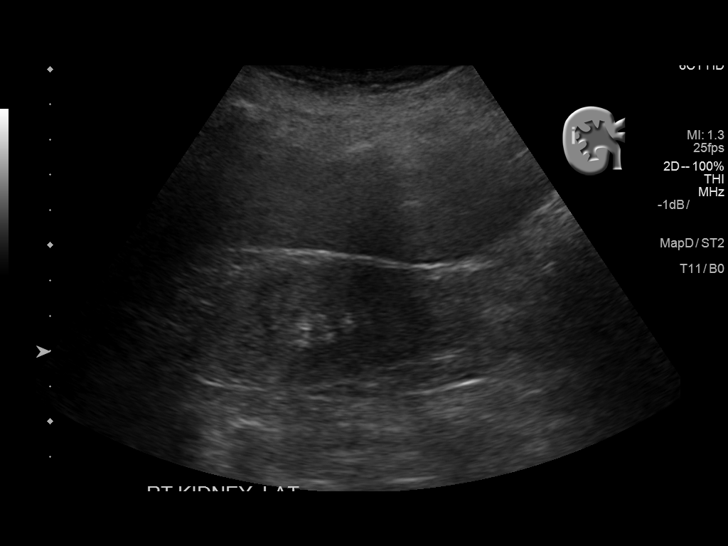
[im 90/141]
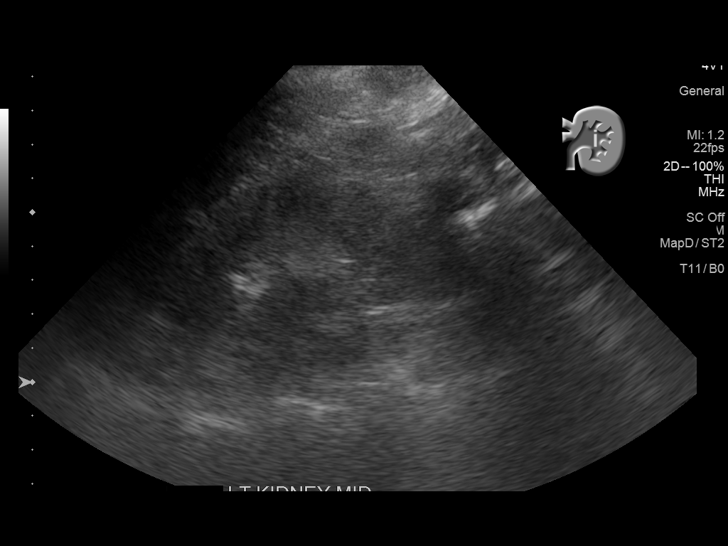
[im 102/141]
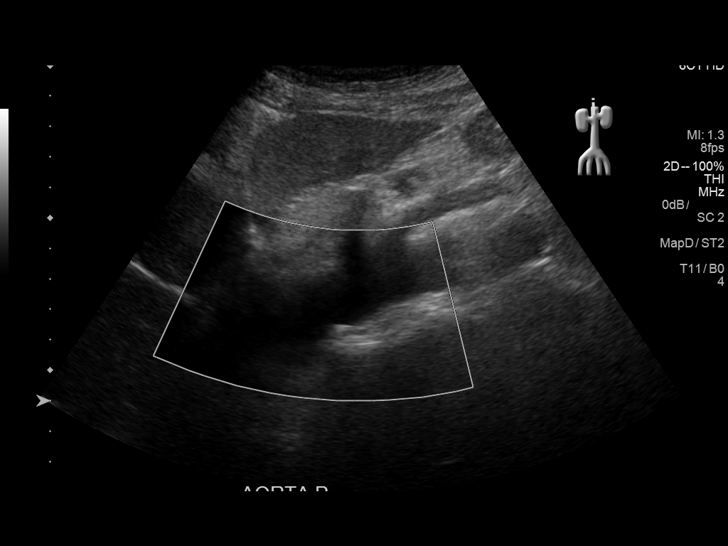
[im 115/141]
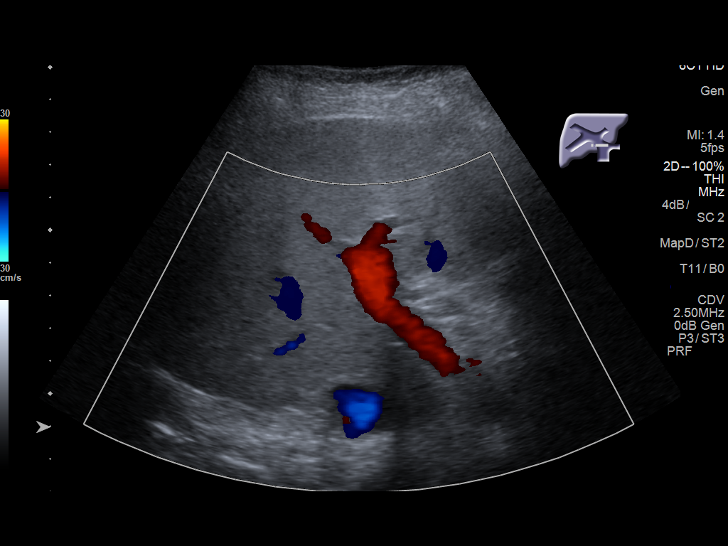
[im 128/141]
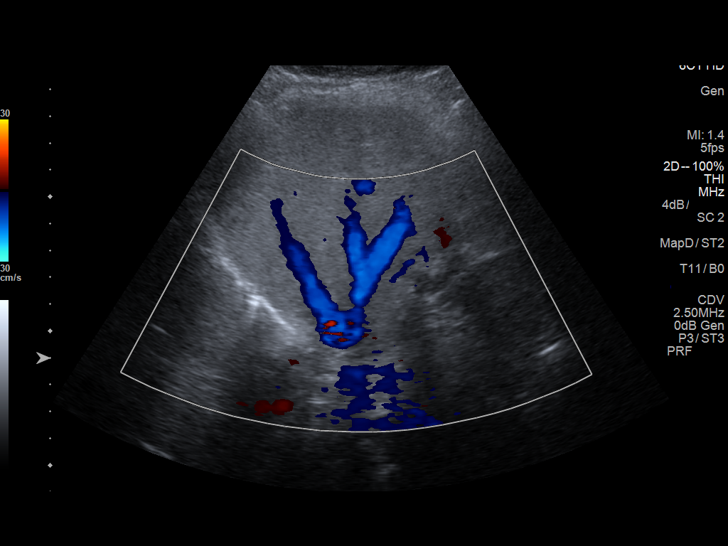
[im 141/141]
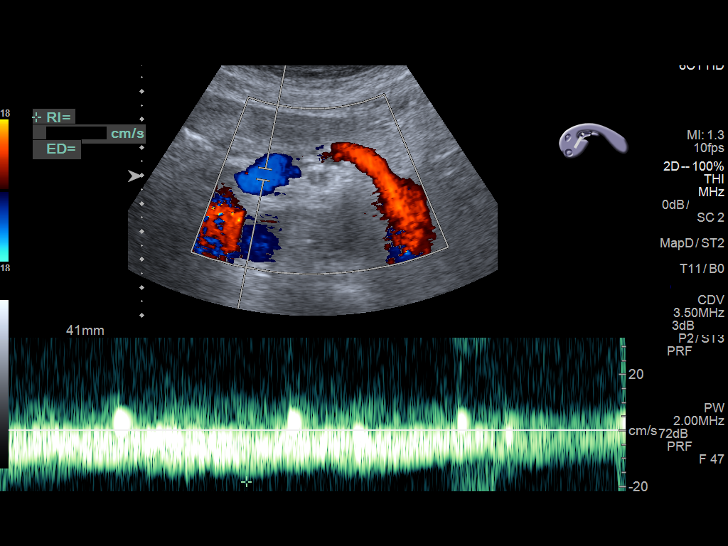

[Series 2002: us abdomen complete · 0.19mm/px · 1 of 1 slices shown (2 of 2)]
[im 1/1]
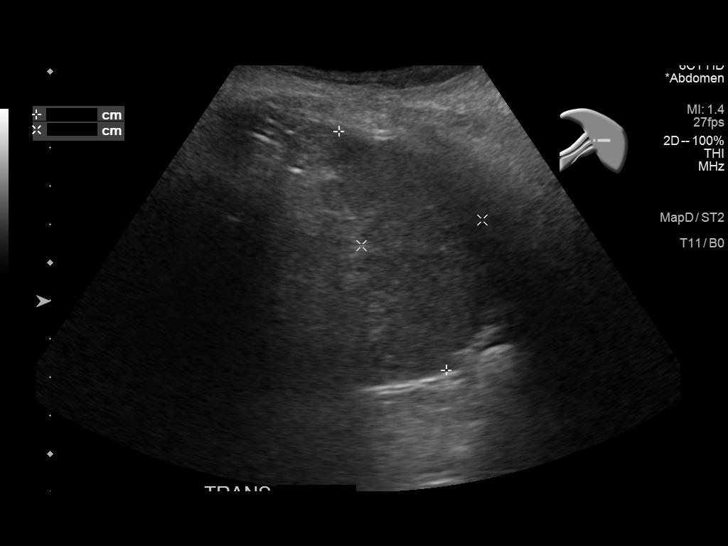

[13 of 25 positions shown; findings below may reference images not displayed]

FINDINGS: Gallbladder: Incompletely distended. Thickened wall up to 9.1 mm
with striations and a trace amount of pericholecystic fluid. Small
calculi layering in the dependent aspect measuring up to 5 mm
diameter. Sonographer reports no sonographic Murphy's sign.

Common bile duct:  Normal in caliber, 5.6mm diameter.

Liver: Homogeneous in echotexture without focal lesion or
intrahepatic bile duct dilatation.

Portal Vein 8.8 diameter. No evidence of occlusion or thrombus.
Velocities (all hepatopetal):

Main:  25-39 cm/sec

Right: 26 cm/sec

Left:  23 cm/sec

Hepatic Vein Velocities (all hepatofugal)

Right:  55 cm/sec

Middle:  42 cm/sec

Left:  37 cm/sec

Hepatic Artery Velocity: 50 cm/sec

IVC:  Patent, 33 cm/seconds flow velocity

Pancreas: Visualized segments unremarkable, portions obscured by
overlying bowel gas.

Spleen:  No focal lesion, 4.4 x 6.9 x 3.2 cm.  (volume = 50.5 cc)

Splenic Vein Velocity: 9.4 cm/sec. No evidence of occlusion or
thrombus.

Right Kidney: No hydronephrosis, isoechoic to the adjacent liver. 8
x 7 x 9 mm partially exophytic cyst from the interpolar region.
Hypoechoic 14 x 12 x 11 mm lesion from the lower pole. M.TGm in
length.

Left Kidney: Septated 23 x 19 x 24 mm hypoechoic lesion in the lower
pole. No hydronephrosis. D.DIm in length.

Abdominal aorta:  Negative

Varices: None seen

Ascites: None seen

Right pleural effusion is noted.
IMPRESSION: 1. Unremarkable liver with normal vascular Doppler evaluation.
2. Cholelithiasis with gallbladder wall thickening.
3. Bilateral renal lesions, inconclusively characterized on this
ultrasound study. Further evaluation with pre and post contrast MRI
or CT should be considered. MRI is preferred in younger patients
(due to lack of ionizing radiation) and for evaluating calcified
lesion(s).
4. Right pleural effusion.

## 2016-11-14 ENCOUNTER — Observation Stay
Admission: EM | Admit: 2016-11-14 | Discharge: 2016-11-15 | Disposition: A | Discharge status: Home / Self Care | Payer: Medicare Other | Attending: Internal Medicine | Admitting: Internal Medicine

## 2016-11-14 DIAGNOSIS — I10 Essential (primary) hypertension: Secondary | ICD-10-CM | POA: Diagnosis present

## 2016-11-14 DIAGNOSIS — M109 Gout, unspecified: Secondary | ICD-10-CM | POA: Diagnosis not present

## 2016-11-14 DIAGNOSIS — R7989 Other specified abnormal findings of blood chemistry: Secondary | ICD-10-CM | POA: Diagnosis not present

## 2016-11-14 DIAGNOSIS — R0789 Other chest pain: Secondary | ICD-10-CM | POA: Diagnosis not present

## 2016-11-14 DIAGNOSIS — N183 Chronic kidney disease, stage 3 (moderate): Secondary | ICD-10-CM | POA: Insufficient documentation

## 2016-11-14 DIAGNOSIS — R748 Abnormal levels of other serum enzymes: Secondary | ICD-10-CM | POA: Insufficient documentation

## 2016-11-14 DIAGNOSIS — R778 Other specified abnormalities of plasma proteins: Secondary | ICD-10-CM

## 2016-11-14 DIAGNOSIS — I160 Hypertensive urgency: Principal | ICD-10-CM | POA: Insufficient documentation

## 2016-11-14 DIAGNOSIS — R079 Chest pain, unspecified: Secondary | ICD-10-CM

## 2016-11-14 DIAGNOSIS — I129 Hypertensive chronic kidney disease with stage 1 through stage 4 chronic kidney disease, or unspecified chronic kidney disease: Secondary | ICD-10-CM | POA: Diagnosis not present

## 2016-11-14 DIAGNOSIS — M1990 Unspecified osteoarthritis, unspecified site: Secondary | ICD-10-CM | POA: Diagnosis not present

## 2016-11-14 DIAGNOSIS — R0602 Shortness of breath: Secondary | ICD-10-CM | POA: Diagnosis not present

## 2016-11-14 DIAGNOSIS — Z7401 Bed confinement status: Secondary | ICD-10-CM | POA: Diagnosis not present

## 2016-11-14 DIAGNOSIS — Z79899 Other long term (current) drug therapy: Secondary | ICD-10-CM | POA: Insufficient documentation

## 2016-11-14 DIAGNOSIS — Z87891 Personal history of nicotine dependence: Secondary | ICD-10-CM | POA: Diagnosis not present

## 2016-11-14 DIAGNOSIS — N189 Chronic kidney disease, unspecified: Secondary | ICD-10-CM

## 2016-11-14 LAB — CBC WITH DIFFERENTIAL/PLATELET
Basophils Absolute: 0.1 10*3/uL (ref 0–0.1)
Basophils Relative: 1 %
EOS PCT: 6 %
Eosinophils Absolute: 0.3 10*3/uL (ref 0–0.7)
HCT: 29.3 % — ABNORMAL LOW (ref 35.0–47.0)
Hemoglobin: 9.8 g/dL — ABNORMAL LOW (ref 12.0–16.0)
LYMPHS ABS: 1.9 10*3/uL (ref 1.0–3.6)
Lymphocytes Relative: 38 %
MCH: 29.8 pg (ref 26.0–34.0)
MCHC: 33.4 g/dL (ref 32.0–36.0)
MCV: 89.3 fL (ref 80.0–100.0)
Monocytes Absolute: 0.3 10*3/uL (ref 0.2–0.9)
Monocytes Relative: 7 %
Neutro Abs: 2.4 10*3/uL (ref 1.4–6.5)
Neutrophils Relative %: 48 %
PLATELETS: 147 10*3/uL — AB (ref 150–440)
RBC: 3.28 MIL/uL — AB (ref 3.80–5.20)
RDW: 13.3 % (ref 11.5–14.5)
WBC: 5 10*3/uL (ref 3.6–11.0)

## 2016-11-14 LAB — COMPREHENSIVE METABOLIC PANEL
ALBUMIN: 2.7 g/dL — AB (ref 3.5–5.0)
ALK PHOS: 51 U/L (ref 38–126)
ALT: 8 U/L — ABNORMAL LOW (ref 14–54)
AST: 28 U/L (ref 15–41)
Anion gap: 6 (ref 5–15)
BILIRUBIN TOTAL: 0.7 mg/dL (ref 0.3–1.2)
BUN: 23 mg/dL — AB (ref 6–20)
CALCIUM: 7.8 mg/dL — AB (ref 8.9–10.3)
CO2: 25 mmol/L (ref 22–32)
CREATININE: 2.68 mg/dL — AB (ref 0.44–1.00)
Chloride: 104 mmol/L (ref 101–111)
GFR calc Af Amer: 18 mL/min — ABNORMAL LOW (ref >60)
GFR, EST NON AFRICAN AMERICAN: 16 mL/min — AB (ref >60)
GLUCOSE: 110 mg/dL — AB (ref 65–99)
POTASSIUM: 3.5 mmol/L (ref 3.5–5.1)
Sodium: 135 mmol/L (ref 135–145)
TOTAL PROTEIN: 6.8 g/dL (ref 6.5–8.1)

## 2016-11-14 LAB — TROPONIN I
TROPONIN I: 0.05 ng/mL — AB (ref <0.03)
Troponin I: 0.06 ng/mL (ref <0.03)
Troponin I: 0.07 ng/mL (ref <0.03)

## 2016-11-14 MED ORDER — VITAMIN D 1000 UNITS PO TABS
1000.0000 [IU] | ORAL_TABLET | Freq: Every day | ORAL | Status: DC
  Administered 2016-11-14 – 2016-11-15 (×2): 1000 [IU] via ORAL
  Filled 2016-11-14 (×2): qty 1

## 2016-11-14 MED ORDER — FEBUXOSTAT 40 MG PO TABS
40.0000 mg | ORAL_TABLET | Freq: Every day | ORAL | Status: DC
  Administered 2016-11-14 – 2016-11-15 (×2): 40 mg via ORAL
  Filled 2016-11-14 (×2): qty 1

## 2016-11-14 MED ORDER — ADULT MULTIVITAMIN W/MINERALS CH
1.0000 | ORAL_TABLET | Freq: Every day | ORAL | Status: DC
  Administered 2016-11-15: 1 via ORAL
  Filled 2016-11-14: qty 1

## 2016-11-14 MED ORDER — CALCIUM-MAGNESIUM-VITAMIN D 500-250-200 MG-MG-UNIT PO TABS: ORAL_TABLET | Freq: Two times a day (BID) | ORAL | Status: DC

## 2016-11-14 MED ORDER — TRAZODONE HCL 50 MG PO TABS
50.0000 mg | ORAL_TABLET | Freq: Every evening | ORAL | Status: DC | PRN
  Filled 2016-11-14: qty 1

## 2016-11-14 MED ORDER — DOCUSATE SODIUM 100 MG PO CAPS: 100.0000 mg | ORAL_CAPSULE | Freq: Two times a day (BID) | ORAL | Status: DC | PRN

## 2016-11-14 MED ORDER — HYDRALAZINE HCL 20 MG/ML IJ SOLN: 10.0000 mg | INTRAMUSCULAR | Status: DC | PRN

## 2016-11-14 MED ORDER — VITAMIN B-1 100 MG PO TABS
100.0000 mg | ORAL_TABLET | Freq: Every day | ORAL | Status: DC
  Administered 2016-11-15: 100 mg via ORAL
  Filled 2016-11-14: qty 1

## 2016-11-14 MED ORDER — FOLIC ACID 1 MG PO TABS
1.0000 mg | ORAL_TABLET | Freq: Every day | ORAL | Status: DC
  Administered 2016-11-14 – 2016-11-15 (×2): 1 mg via ORAL
  Filled 2016-11-14 (×2): qty 1

## 2016-11-14 MED ORDER — ACETAMINOPHEN 325 MG PO TABS: 325.0000 mg | ORAL_TABLET | Freq: Four times a day (QID) | ORAL | Status: DC | PRN

## 2016-11-14 MED ORDER — MAGNESIUM OXIDE 400 (241.3 MG) MG PO TABS
400.0000 mg | ORAL_TABLET | Freq: Every day | ORAL | Status: DC
  Administered 2016-11-14 – 2016-11-15 (×2): 400 mg via ORAL
  Filled 2016-11-14 (×2): qty 1

## 2016-11-14 MED ORDER — AMLODIPINE BESYLATE 10 MG PO TABS
10.0000 mg | ORAL_TABLET | Freq: Every day | ORAL | Status: DC
  Administered 2016-11-15: 10 mg via ORAL
  Filled 2016-11-14: qty 1

## 2016-11-14 MED ORDER — LABETALOL HCL 5 MG/ML IV SOLN
10.0000 mg | INTRAVENOUS | Status: DC | PRN
  Administered 2016-11-14: 10 mg via INTRAVENOUS
  Filled 2016-11-14: qty 4

## 2016-11-14 MED ORDER — ASPIRIN 81 MG PO CHEW
324.0000 mg | CHEWABLE_TABLET | Freq: Once | ORAL | Status: AC
  Administered 2016-11-14: 324 mg via ORAL
  Filled 2016-11-14: qty 4

## 2016-11-14 MED ORDER — LABETALOL HCL 5 MG/ML IV SOLN
10.0000 mg | Freq: Once | INTRAVENOUS | Status: AC
  Administered 2016-11-14: 10 mg via INTRAVENOUS
  Filled 2016-11-14: qty 4

## 2016-11-14 MED ORDER — PANTOPRAZOLE SODIUM 40 MG PO TBEC
40.0000 mg | DELAYED_RELEASE_TABLET | Freq: Every day | ORAL | Status: DC
  Administered 2016-11-14 – 2016-11-15 (×2): 40 mg via ORAL
  Filled 2016-11-14 (×2): qty 1

## 2016-11-14 MED ORDER — OXYBUTYNIN CHLORIDE ER 5 MG PO TB24
5.0000 mg | ORAL_TABLET | Freq: Every day | ORAL | Status: DC
  Administered 2016-11-14: 5 mg via ORAL
  Filled 2016-11-14: qty 1

## 2016-11-14 MED ORDER — DOCUSATE SODIUM 100 MG PO CAPS
100.0000 mg | ORAL_CAPSULE | Freq: Every day | ORAL | Status: DC
  Administered 2016-11-14 – 2016-11-15 (×2): 100 mg via ORAL
  Filled 2016-11-14 (×2): qty 1

## 2016-11-14 MED ORDER — FERROUS SULFATE 325 (65 FE) MG PO TABS
650.0000 mg | ORAL_TABLET | Freq: Every day | ORAL | Status: DC
  Administered 2016-11-15: 650 mg via ORAL
  Filled 2016-11-14: qty 2

## 2016-11-14 MED ORDER — ENSURE ENLIVE PO LIQD: 237.0000 mL | Freq: Two times a day (BID) | ORAL | Status: DC

## 2016-11-14 MED ORDER — LEVOTHYROXINE SODIUM 50 MCG PO TABS
50.0000 ug | ORAL_TABLET | Freq: Every day | ORAL | Status: DC
  Administered 2016-11-15: 50 ug via ORAL
  Filled 2016-11-14: qty 1

## 2016-11-14 MED ORDER — POLYETHYLENE GLYCOL 3350 17 G PO PACK: 17.0000 g | PACK | Freq: Every day | ORAL | Status: DC | PRN

## 2016-11-14 MED ORDER — CALCIUM CARBONATE-VITAMIN D 500-200 MG-UNIT PO TABS
1.0000 | ORAL_TABLET | Freq: Two times a day (BID) | ORAL | Status: DC
  Administered 2016-11-14 – 2016-11-15 (×2): 1 via ORAL
  Filled 2016-11-14 (×2): qty 1

## 2016-11-14 MED ORDER — HYDRALAZINE HCL 20 MG/ML IJ SOLN
10.0000 mg | Freq: Four times a day (QID) | INTRAMUSCULAR | Status: DC | PRN
  Administered 2016-11-14: 10 mg via INTRAVENOUS
  Filled 2016-11-14: qty 1

## 2016-11-14 MED ORDER — HEPARIN SODIUM (PORCINE) 5000 UNIT/ML IJ SOLN
5000.0000 [IU] | Freq: Three times a day (TID) | INTRAMUSCULAR | Status: DC
  Administered 2016-11-14 – 2016-11-15 (×3): 5000 [IU] via SUBCUTANEOUS
  Filled 2016-11-14 (×2): qty 1

## 2016-11-14 NOTE — ED Triage Notes (Addendum)
Pt comes via [facility] from home for c/o high BP and dizziness. Pt states her home health nurse gave her medications today, but unsure if it was her BP medication. BS-119. EMS reports BP of 190/124. Pt in NAD at this time. Respirations even and unlabored.

## 2016-11-14 NOTE — ED Notes (Signed)
Pt given meal tray and water 

## 2016-11-14 NOTE — ED Provider Notes (Signed)
[facility] Emergency Department Provider Note       Time seen: ----------------------------------------- 3:14 PM on 11/14/2016 -----------------------------------------     I have reviewed the triage vital signs and the nursing notes.   HISTORY   Chief Complaint Hypertension    HPI [name] Holmes is a 82 y.o. female who presents to the ED for hypertension. She rales via EMS from home after her home health nurse gave her medications today. Patient is unsure if it was her blood pressure medicine. Reportedly outpatient blood pressure was over 185 systolic. She denies any complaints, pain or sickness.   Past Medical History:  Diagnosis Date  . Anemia of chronic renal failure   . Arthritis   . CKD (chronic kidney disease)   . ETOH abuse   . Gout   . Hypertension     Patient Active Problem List   Diagnosis Date Noted  . Alcoholism ([place])   . GIB (gastrointestinal bleeding) 10/14/2015  . Melena 08/24/2015  . Anemia of chronic kidney failure 04/26/2015  . Pressure ulcer 12/06/2014  . Weakness 12/05/2014  . H/O ETOH abuse 12/05/2014  . HTN (hypertension) 12/05/2014  . Gout 12/05/2014  . Acute on chronic kidney failure ([place]) 11/03/2014  . UTI (lower urinary tract infection) 11/03/2014  . Anemia     Past Surgical History:  Procedure Laterality Date  . ABDOMINAL HYSTERECTOMY    . COLONOSCOPY WITH PROPOFOL N/A 12/08/2014   Procedure: COLONOSCOPY WITH PROPOFOL;  Surgeon: Josefine Class, MD;  Location[facility] ENDOSCOPY;  Service: Endoscopy;  Laterality: N/A;  . ESOPHAGOGASTRODUODENOSCOPY (EGD) WITH PROPOFOL N/A 11/08/2014   Procedure: ESOPHAGOGASTRODUODENOSCOPY (EGD) WITH PROPOFOL;  Surgeon: Lollie Sails, MD;  Location: [facility] ENDOSCOPY;  Service: Endoscopy;  Laterality: N/A;  . GIVENS CAPSULE STUDY N/A 08/26/2015   Procedure: GIVENS CAPSULE STUDY;  Surgeon: Josefine Class, MD;  Location: [facility] ENDOSCOPY;  Service: Endoscopy;  Laterality:  N/A;    Allergies Patient has no known allergies.  Social History Social History  Substance Use Topics  . Smoking status: Former Smoker    Types: Cigarettes  . Smokeless tobacco: Never Used  . Alcohol use 7.2 oz/week    6 Cans of beer, 6 Shots of liquor per week     Comment: daily    Review of Systems Constitutional: Negative for fever. Eyes: Negative for vision changes ENT:  Negative for congestion, sore throat Cardiovascular: Negative for chest pain. Respiratory: Negative for shortness of breath. Gastrointestinal: Negative for abdominal pain, vomiting and diarrhea. Musculoskeletal: Negative for back pain. Skin: Negative for rash. Neurological: Negative for headaches, focal weakness or numbness.  All systems negative/normal/unremarkable except as stated in the HPI  ____________________________________________   PHYSICAL EXAM:  VITAL SIGNS: ED Triage Vitals  Enc Vitals Group     BP 11/14/16 1504 (!) 164/98     Pulse Rate 11/14/16 1504 89     Resp 11/14/16 1504 16     Temp 11/14/16 1504 98.6 F (37 C)     Temp Source 11/14/16 1504 Oral     SpO2 11/14/16 1504 96 %     Weight 11/14/16 1505 140 lb (63.5 kg)     Height 11/14/16 1505 5\' 4"  (1.626 m)     Head Circumference --      Peak Flow --      Pain Score --      Pain Loc --      Pain Edu? --      Excl. in [place]? --  Constitutional: Alert and oriented. No acute distress Eyes: Conjunctivae are normal. Normal extraocular movements. ENT   Head: Normocephalic and atraumatic.   Nose: No congestion/rhinnorhea.   Mouth/Throat: Mucous membranes are moist.   Neck: No stridor. Cardiovascular: Normal rate, regular rhythm. No murmurs, rubs, or gallops. Respiratory: Normal respiratory effort without tachypnea nor retractions. Breath sounds are clear and equal bilaterally. No wheezes/rales/rhonchi. Gastrointestinal: Soft and nontender. Normal bowel sounds Musculoskeletal: Nontender with normal range of motion  in extremities. No lower extremity tenderness nor edema. Neurologic:  Normal speech and language. No gross focal neurologic deficits are appreciated.  Skin:  Skin is warm, dry and intact. No rash noted. Psychiatric:  Speech and behavior are normal.  ____________________________________________  EKG: Interpreted by me. Sinus rhythm rate 85 bpm, normal PR interval, normal QRS, normal QT, abnormal T waves  ____________________________________________  ED COURSE:  Pertinent labs & imaging results that were available during my care of the patient were reviewed by me and considered in my medical decision making (see chart for details). Patient presents for high blood pressure, we will assess with labs as indicated.   Procedures ____________________________________________   LABS (pertinent positives/negatives)  Labs Reviewed  CBC WITH DIFFERENTIAL/PLATELET - Abnormal; Notable for the following:       Result Value   RBC 3.28 (*)    Hemoglobin 9.8 (*)    HCT 29.3 (*)    Platelets 147 (*)    All other components within normal limits  COMPREHENSIVE METABOLIC PANEL - Abnormal; Notable for the following:    Glucose, Bld 110 (*)    BUN 23 (*)    Creatinine, Ser 2.68 (*)    Calcium 7.8 (*)    Albumin 2.7 (*)    ALT 8 (*)    GFR calc non Af Amer 16 (*)    GFR calc Af Amer 18 (*)    All other components within normal limits  TROPONIN I - Abnormal; Notable for the following:    Troponin I 0.05 (*)    All other components within normal limits  URINALYSIS, COMPLETE (UACMP) WITH MICROSCOPIC  ____________________________________________  FINAL ASSESSMENT AND PLAN  Hypertension, Chronic kidney disease, elevated troponin, new EKG changes  Plan: Patient's labs and imaging were dictated above. Patient had presented for hypertension and was found to have new EKG changes and an elevated troponin. She does have chronic kidney disease which could explain the elevated troponin but her creatinine  was higher last year with a normal troponin. She denies any complaints, she will likely need observation and blood pressure management. She'll receive IV labetalol for hypertension.   Earleen Newport, MD   Note: This note was generated in part or whole with voice recognition software. Voice recognition is usually quite accurate but there are transcription errors that can and very often do occur. I apologize for any typographical errors that were not detected and corrected.     Earleen Newport, MD 11/14/16 [phone]

## 2016-11-14 NOTE — H&P (Signed)
[facility] NAME: [name] Holmes    MR#:  [id number]  DATE OF BIRTH:  08-10-1934  DATE OF ADMISSION:  11/14/2016  PRIMARY CARE PHYSICIAN: Marguerita Merles, MD   REQUESTING/REFERRING PHYSICIAN: williams  CHIEF COMPLAINT:   Chief Complaint  Patient presents with  . Hypertension    HISTORY OF PRESENT ILLNESS: [name] Holmes  is a 82 y.o. female with a known history of Arthritis, CKD, Htn- bed bound at home- had SOB and chest tightness today morning, BP was noted high by EMS- 190/ 124. Given inj labetalol by ER. EKG showed some T wave inversions, which is new. So suggested to monitor in hospital.  PAST MEDICAL HISTORY:   Past Medical History:  Diagnosis Date  . Anemia of chronic renal failure   . Arthritis   . CKD (chronic kidney disease)   . ETOH abuse   . Gout   . Hypertension     PAST SURGICAL HISTORY: Past Surgical History:  Procedure Laterality Date  . ABDOMINAL HYSTERECTOMY    . COLONOSCOPY WITH PROPOFOL N/A 12/08/2014   Procedure: COLONOSCOPY WITH PROPOFOL;  Surgeon: Josefine Class, MD;  Location[facility] ENDOSCOPY;  Service: Endoscopy;  Laterality: N/A;  . ESOPHAGOGASTRODUODENOSCOPY (EGD) WITH PROPOFOL N/A 11/08/2014   Procedure: ESOPHAGOGASTRODUODENOSCOPY (EGD) WITH PROPOFOL;  Surgeon: Lollie Sails, MD;  Location: [facility] ENDOSCOPY;  Service: Endoscopy;  Laterality: N/A;  . GIVENS CAPSULE STUDY N/A 08/26/2015   Procedure: GIVENS CAPSULE STUDY;  Surgeon: Josefine Class, MD;  Location: [facility] ENDOSCOPY;  Service: Endoscopy;  Laterality: N/A;    SOCIAL HISTORY:  Social History  Substance Use Topics  . Smoking status: Former Smoker    Types: Cigarettes  . Smokeless tobacco: Never Used  . Alcohol use 7.2 oz/week    6 Cans of beer, 6 Shots of liquor per week     Comment: daily    FAMILY HISTORY:  Family History  Problem Relation Age of Onset  . Hypertension Father     DRUG ALLERGIES: No Known  Allergies  REVIEW OF SYSTEMS:   CONSTITUTIONAL: No fever, fatigue or weakness.  EYES: No blurred or double vision.  EARS, NOSE, AND THROAT: No tinnitus or ear pain.  RESPIRATORY: No cough,positive shortness of breath,no wheezing or hemoptysis.  CARDIOVASCULAR: No chest pain, orthopnea, edema.  GASTROINTESTINAL: No nausea, vomiting, diarrhea or abdominal pain.  GENITOURINARY: No dysuria, hematuria.  ENDOCRINE: No polyuria, nocturia,  HEMATOLOGY: No anemia, easy bruising or bleeding SKIN: No rash or lesion. MUSCULOSKELETAL: No joint pain or arthritis.   NEUROLOGIC: No tingling, numbness, weakness.  PSYCHIATRY: No anxiety or depression.   MEDICATIONS AT HOME:  Prior to Admission medications   Medication Sig Start Date End Date Taking? Authorizing Provider  acetaminophen (TYLENOL) 325 MG tablet Take 325-650 mg by mouth every 6 (six) hours as needed for mild pain, moderate pain or fever.   Yes [provider]  Calcium-Magnesium-Vitamin D (CALCIUM 500 PO) Take 500 mg by mouth 2 (two) times daily.   Yes [provider]  cholecalciferol (VITAMIN D) 1000 units tablet Take 1,000 Units by mouth daily.   Yes [provider]  diphenhydrAMINE (BENADRYL) 25 mg capsule Take 25 mg by mouth every 6 (six) hours as needed for itching.   Yes [provider]  docusate sodium (COLACE) 100 MG capsule Take 100 mg by mouth daily.   Yes [provider]  febuxostat (ULORIC) 40 MG tablet Take 40 mg by mouth daily.  Yes [provider]  ferrous sulfate 325 (65 FE) MG tablet Take 650 mg by mouth daily with breakfast.   Yes [provider]  folic acid (FOLVITE) 1 MG tablet Take 1 mg by mouth daily.   Yes [provider]  levothyroxine (SYNTHROID, LEVOTHROID) 50 MCG tablet Take 50 mcg by mouth daily before breakfast.    Yes [provider]  Multiple Vitamin (MULTIVITAMIN WITH MINERALS) TABS tablet Take 1 tablet by mouth daily.   Yes  [provider]  oxybutynin (DITROPAN-XL) 5 MG 24 hr tablet Take 5 mg by mouth at bedtime.   Yes [provider]  pantoprazole (PROTONIX) 40 MG tablet Take 1 tablet (40 mg total) by mouth daily. 11/16/15  Yes Mody, Ulice Bold, MD  polyethylene glycol (MIRALAX / GLYCOLAX) packet Take 17 g by mouth daily as needed for mild constipation.    Yes [provider]  thiamine (VITAMIN B-1) 100 MG tablet Take 100 mg by mouth daily.   Yes [provider]  traZODone (DESYREL) 50 MG tablet Take 50 mg by mouth at bedtime as needed for sleep.    Yes [provider]  cefUROXime (CEFTIN) 500 MG tablet Take 1 tablet (500 mg total) by mouth daily. 11/16/15   Bettey Costa, MD  feeding supplement, ENSURE ENLIVE, (ENSURE ENLIVE) LIQD Take 237 mLs by mouth 2 (two) times daily between meals. 11/16/15   Bettey Costa, MD  magnesium oxide (MAG-OX) 400 MG tablet Take 400 mg by mouth daily.    [provider]      PHYSICAL EXAMINATION:   VITAL SIGNS: Blood pressure (!) 163/101, pulse 84, temperature 98.6 F (37 C), temperature source Oral, resp. rate 17, height 5\' 4"  (1.626 m), weight 63.5 kg (140 lb), SpO2 97 %.  GENERAL:  82 y.o.-year-old patient lying in the bed with no acute distress.  EYES: Pupils equal, round, reactive to light and accommodation. No scleral icterus. Extraocular muscles intact.  HEENT: Head atraumatic, normocephalic. Oropharynx and nasopharynx clear.  NECK:  Supple, no jugular venous distention. No thyroid enlargement, no tenderness.  LUNGS: Normal breath sounds bilaterally, no wheezing, rales,rhonchi or crepitation. No use of accessory muscles of respiration.  CARDIOVASCULAR: S1, S2 normal. No murmurs, rubs, or gallops.  ABDOMEN: Soft, nontender, nondistended. Bowel sounds present. No organomegaly or mass.  EXTREMITIES: No pedal edema, cyanosis, or clubbing.  NEUROLOGIC: Cranial nerves II through XII are intact. Muscle strength 3/5 in all extremities.  Sensation intact. Gait not checked.  PSYCHIATRIC: The patient is alert and oriented x 3.  SKIN: No obvious rash, lesion, or ulcer.   LABORATORY PANEL:   CBC  Recent Labs Lab 11/14/16 1514  WBC 5.0  HGB 9.8*  HCT 29.3*  PLT 147*  MCV 89.3  MCH 29.8  MCHC 33.4  RDW 13.3  LYMPHSABS 1.9  MONOABS 0.3  EOSABS 0.3  BASOSABS 0.1   ------------------------------------------------------------------------------------------------------------------  Chemistries   Recent Labs Lab 11/14/16 1514  NA 135  K 3.5  CL 104  CO2 25  GLUCOSE 110*  BUN 23*  CREATININE 2.68*  CALCIUM 7.8*  AST 28  ALT 8*  ALKPHOS 51  BILITOT 0.7   ------------------------------------------------------------------------------------------------------------------ estimated creatinine clearance is 14.7 mL/min (A) (by C-G formula based on SCr of 2.68 mg/dL (H)). ------------------------------------------------------------------------------------------------------------------ No results for input(s): TSH, T4TOTAL, T3FREE, THYROIDAB in the last 72 hours.  Invalid input(s): FREET3   Coagulation profile No results for input(s): INR, PROTIME in the last 168 hours. ------------------------------------------------------------------------------------------------------------------- No results for input(s): DDIMER in  the last 72 hours. -------------------------------------------------------------------------------------------------------------------  Cardiac Enzymes  Recent Labs Lab 11/14/16 1514  TROPONINI 0.05*   ------------------------------------------------------------------------------------------------------------------ Invalid input(s): POCBNP  ---------------------------------------------------------------------------------------------------------------  Urinalysis    Component Value Date/Time   COLORURINE AMBER (A) 11/14/2015 1610   APPEARANCEUR TURBID (A) 11/14/2015 1610   APPEARANCEUR  Cloudy 06/06/2014 1933   LABSPEC 1.010 11/14/2015 1610   LABSPEC 1.009 06/06/2014 1933   PHURINE 6.0 11/14/2015 1610   GLUCOSEU NEGATIVE 11/14/2015 1610   GLUCOSEU Negative 06/06/2014 1933   HGBUR 1+ (A) 11/14/2015 1610   BILIRUBINUR NEGATIVE 11/14/2015 1610   BILIRUBINUR Negative 06/06/2014 1933   KETONESUR TRACE (A) 11/14/2015 1610   PROTEINUR 100 (A) 11/14/2015 1610   NITRITE NEGATIVE 11/14/2015 1610   LEUKOCYTESUR 3+ (A) 11/14/2015 1610   LEUKOCYTESUR 3+ 06/06/2014 1933     RADIOLOGY: No results found.  EKG: Orders placed or performed during the hospital encounter of 11/14/16  . ED EKG  . ED EKG    IMPRESSION AND PLAN:  * HYpertensive urgency   Start oral amlodipine, give Hydralazine Inj PRN.    * New EKG changes   Troponin is negative.   Monitor on tele, Get serial troponin and Echocardiogram  * Arthritis   Cont home meds.  All the records are reviewed and case discussed with ED provider. Management plans discussed with the patient, family and they are in agreement.  CODE STATUS: Limited, She does not want intubation or ventilator, everything else is OK Code Status History    Date Active Date Inactive Code Status Order ID Comments User Context   11/14/2015  8:12 PM 11/16/2015  7:59 PM Full Code [id number]  Vaughan Basta, MD Inpatient   10/14/2015  8:48 AM 10/16/2015  8:33 PM Full Code [id number]  [name], MD Inpatient   08/24/2015 11:18 PM 08/28/2015  7:38 PM Full Code [id number]  Hillary Bow, MD ED   12/05/2014  8:51 PM 12/08/2014 10:13 PM Full Code [id number]  Dustin Flock, MD Inpatient   11/03/2014  9:43 AM 11/08/2014 11:46 PM Full Code [id number]  Harrie Foreman, MD Inpatient     I spoke to her daughter also in room.  TOTAL TIME TAKING CARE OF THIS PATIENT: 45 minutes.    Vaughan Basta M.D on 11/14/2016   Between 7am to 6pm - Pager - [phone]  After 6pm go to [email or web address] - password [email or web address] [place] [facility]  Office   [phone]  CC: Primary care physician; Marguerita Merles, MD   Note: This dictation was prepared with Dragon dictation along with smaller phrase technology. Any transcriptional errors that result from this process are unintentional.

## 2016-11-14 NOTE — ED Notes (Signed)
Floor Called in regards to bed assignment for patient.  Informed they were still not ready to make the patient rooms available at this time.

## 2016-11-14 NOTE — Progress Notes (Signed)
Family Meeting Note  Advance Directive:no  Today a meeting took place with the Patient and dAUGHTER.  The following clinical team members were present during this meeting:MD  The following were discussed:Patient's diagnosis: hypertension, arthritis , Patient's progosis: Unable to determine and Goals for treatment: Continue present management    Pt will not like to be intubated or go on ventilator support, but is OK with CPR and defibrillation.  Additional follow-up to be provided: PMD  Time spent during discussion:20 minutes  Brylinn Teaney, Rosalio Macadamia, MD

## 2016-11-15 ENCOUNTER — Observation Stay
Admit: 2016-11-15 | Discharge: 2016-11-15 | Disposition: A | Discharge status: Home / Self Care | Payer: Medicare Other | Attending: Internal Medicine | Admitting: Internal Medicine

## 2016-11-15 DIAGNOSIS — I160 Hypertensive urgency: Secondary | ICD-10-CM | POA: Diagnosis not present

## 2016-11-15 LAB — BASIC METABOLIC PANEL
ANION GAP: 7 (ref 5–15)
BUN: 23 mg/dL — AB (ref 6–20)
CALCIUM: 8 mg/dL — AB (ref 8.9–10.3)
CO2: 25 mmol/L (ref 22–32)
Chloride: 103 mmol/L (ref 101–111)
Creatinine, Ser: 2.5 mg/dL — ABNORMAL HIGH (ref 0.44–1.00)
GFR calc Af Amer: 20 mL/min — ABNORMAL LOW (ref >60)
GFR calc non Af Amer: 17 mL/min — ABNORMAL LOW (ref >60)
GLUCOSE: 94 mg/dL (ref 65–99)
Potassium: 3.2 mmol/L — ABNORMAL LOW (ref 3.5–5.1)
Sodium: 135 mmol/L (ref 135–145)

## 2016-11-15 LAB — CBC
HCT: 27.5 % — ABNORMAL LOW (ref 35.0–47.0)
HEMOGLOBIN: 9 g/dL — AB (ref 12.0–16.0)
MCH: 28.8 pg (ref 26.0–34.0)
MCHC: 32.8 g/dL (ref 32.0–36.0)
MCV: 87.8 fL (ref 80.0–100.0)
Platelets: 153 10*3/uL (ref 150–440)
RBC: 3.14 MIL/uL — ABNORMAL LOW (ref 3.80–5.20)
RDW: 13.3 % (ref 11.5–14.5)
WBC: 5.8 10*3/uL (ref 3.6–11.0)

## 2016-11-15 LAB — ECHOCARDIOGRAM COMPLETE
Height: 64 in
WEIGHTICAEL: 1760 [oz_av]

## 2016-11-15 LAB — TROPONIN I: Troponin I: 0.07 ng/mL (ref <0.03)

## 2016-11-15 MED ORDER — METOPROLOL TARTRATE 25 MG PO TABS
25.0000 mg | ORAL_TABLET | Freq: Two times a day (BID) | ORAL | Status: DC
  Administered 2016-11-15: 25 mg via ORAL
  Filled 2016-11-15: qty 1

## 2016-11-15 MED ORDER — HYDRALAZINE HCL 20 MG/ML IJ SOLN: 10.0000 mg | Freq: Four times a day (QID) | INTRAMUSCULAR | Status: DC | PRN

## 2016-11-15 MED ORDER — HYDRALAZINE HCL 25 MG PO TABS
25.0000 mg | ORAL_TABLET | Freq: Three times a day (TID) | ORAL | Status: DC
  Administered 2016-11-15: 25 mg via ORAL
  Filled 2016-11-15 (×2): qty 1

## 2016-11-15 MED ORDER — ALUM & MAG HYDROXIDE-SIMETH 200-200-20 MG/5ML PO SUSP
30.0000 mL | Freq: Once | ORAL | Status: AC
  Administered 2016-11-15: 30 mL via ORAL
  Filled 2016-11-15: qty 30

## 2016-11-15 MED ORDER — METOPROLOL TARTRATE 25 MG PO TABS
25.0000 mg | ORAL_TABLET | Freq: Two times a day (BID) | ORAL | 1 refills | Status: DC
Start: 2016-11-15 — End: 2017-09-23

## 2016-11-15 MED ORDER — POTASSIUM CHLORIDE CRYS ER 20 MEQ PO TBCR
40.0000 meq | EXTENDED_RELEASE_TABLET | Freq: Once | ORAL | Status: AC
  Administered 2016-11-15: 40 meq via ORAL
  Filled 2016-11-15: qty 2

## 2016-11-15 MED ORDER — AMLODIPINE BESYLATE 10 MG PO TABS: 10.0000 mg | ORAL_TABLET | Freq: Every day | ORAL | 0 refills | Status: DC

## 2016-11-15 NOTE — Progress Notes (Signed)
Pt arrived from ED alert and oriented to self and place. No c/o pain, no concerns offered. Telemetry and skin verified with Mathis Fare. Blood pressure still high, pt c/o SOB, O2 sats are 99% on RA.

## 2016-11-15 NOTE — Care Management (Signed)
Placed in observation for elevated blood pressure.  [facility]ed by [facility] for monthly med box management. Requested that visit frequency be increased to monitor patient's blood pressure. Daughter reqests transport home by ambulance.  Says patient is able to sit up "she just won't"  Family would not be able to get the patient in the house.  Informed daughter that insurance will not cover ambulance transport that is not medically necessary and patient / family will have to assume financial responsibility.  Discussed there would have to be someone in the home to accept patient. Verbalized understanding of both

## 2016-11-15 NOTE — Discharge Summary (Signed)
[name] Holmes at [place] NAME: [name] Holmes    MR#:  [id number]  DATE OF BIRTH:  12/22/1934  DATE OF ADMISSION:  11/14/2016 ADMITTING PHYSICIAN: [name] Basta, MD  DATE OF DISCHARGE: 11/15/16  PRIMARY CARE PHYSICIAN: [name] Merles, MD    ADMISSION DIAGNOSIS:  Hypertensive urgency [I16.0] Elevated troponin I level [R74.8] Chronic kidney disease, unspecified CKD stage [N18.9]  DISCHARGE DIAGNOSIS:  HTN-uncontrolled CKD-III  SECONDARY DIAGNOSIS:   Past Medical History:  Diagnosis Date  . Anemia of chronic renal failure   . Arthritis   . CKD (chronic kidney disease)   . ETOH abuse   . Gout   . Hypertension     HOSPITAL COURSE:  [name] Holmes  is a 82 y.o. female with a known history of Arthritis, CKD, Htn- bed bound at home- had SOB and chest tightness today morning, BP was noted high by EMS- 190/ 124  * HYpertensive urgency   Start oral amlodipine, give Hydralazine Inj PRN. -metoprolol 25 mg bid BP much improved    * New EKG changes Appears nonspecific ST-T changes.   Troponin is mild elevation appears secondary to demand ischemia for hypertension Echocardiogram done pending to be read Patient was seen by [name] Holmes and recommends outpatient Myoview stress test on Monday, May 21.  * Arthritis   Cont home meds.  *CKD stage III creatinine at baseline  Per patient's daughter she is most of the time bed bound and wheelchair bound. Spoke with [employer] daughter over the phone. Cell phone number [phone]. Patient was discharged to home. Care management to assist with patient discharge transportation  CONSULTS OBTAINED:  Treatment Team:  [name] David, MD  DRUG ALLERGIES:  No Known Allergies  DISCHARGE MEDICATIONS:   Current Discharge Medication List    START taking these medications   Details  amLODipine (NORVASC) 10 MG tablet Take 1 tablet (10 mg total) by mouth daily. Qty: 30 tablet,  Refills: 0    metoprolol tartrate (LOPRESSOR) 25 MG tablet Take 1 tablet (25 mg total) by mouth 2 (two) times daily. Qty: 60 tablet, Refills: 1      CONTINUE these medications which have NOT CHANGED   Details  acetaminophen (TYLENOL) 325 MG tablet Take 325-650 mg by mouth every 6 (six) hours as needed for mild pain, moderate pain or fever.    Calcium-Magnesium-Vitamin D (CALCIUM 500 PO) Take 500 mg by mouth 2 (two) times daily.    cholecalciferol (VITAMIN D) 1000 units tablet Take 1,000 Units by mouth daily.    diphenhydrAMINE (BENADRYL) 25 mg capsule Take 25 mg by mouth every 6 (six) hours as needed for itching.    docusate sodium (COLACE) 100 MG capsule Take 100 mg by mouth daily.    febuxostat (ULORIC) 40 MG tablet Take 40 mg by mouth daily.    ferrous sulfate 325 (65 FE) MG tablet Take 650 mg by mouth daily with breakfast.    folic acid (FOLVITE) 1 MG tablet Take 1 mg by mouth daily.    levothyroxine (SYNTHROID, LEVOTHROID) 50 MCG tablet Take 50 mcg by mouth daily before breakfast.     Multiple Vitamin (MULTIVITAMIN WITH MINERALS) TABS tablet Take 1 tablet by mouth daily.    oxybutynin (DITROPAN-XL) 5 MG 24 hr tablet Take 5 mg by mouth at bedtime.    pantoprazole (PROTONIX) 40 MG tablet Take 1 tablet (40 mg total) by mouth daily. Qty: 30 tablet, Refills: 0    polyethylene glycol (MIRALAX /  GLYCOLAX) packet Take 17 g by mouth daily as needed for mild constipation.     thiamine (VITAMIN B-1) 100 MG tablet Take 100 mg by mouth daily.    traZODone (DESYREL) 50 MG tablet Take 50 mg by mouth at bedtime as needed for sleep.     feeding supplement, ENSURE ENLIVE, (ENSURE ENLIVE) LIQD Take 237 mLs by mouth 2 (two) times daily between meals. Qty: 237 mL, Refills: 12    magnesium oxide (MAG-OX) 400 MG tablet Take 400 mg by mouth daily.      STOP taking these medications     cefUROXime (CEFTIN) 500 MG tablet         If you experience worsening of your admission symptoms,  develop shortness of breath, life threatening emergency, suicidal or homicidal thoughts you must seek medical attention immediately by calling 911 or calling your MD immediately  if symptoms less severe.  You Must read complete instructions/literature along with all the possible adverse reactions/side effects for all the Medicines you take and that have been prescribed to you. Take any new Medicines after you have completely understood and accept all the possible adverse reactions/side effects.   Please note  You were cared for by a hospitalist during your hospital stay. If you have any questions about your discharge medications or the care you received while you were in the hospital after you are discharged, you can call the unit and asked to speak with the hospitalist on call if the hospitalist that took care of you is not available. Once you are discharged, your primary care physician will handle any further medical issues. Please note that NO REFILLS for any discharge medications will be authorized once you are discharged, as it is imperative that you return to your primary care physician (or establish a relationship with a primary care physician if you do not have one) for your aftercare needs so that they can reassess your need for medications and monitor your lab values. Today   SUBJECTIVE    I came here because my blood pressure was elevated. Denies any chest pain or shortness of breath VITAL SIGNS:  Blood pressure 137/76, pulse 73, temperature 98 F (36.7 C), temperature source Oral, resp. rate 18, height 5\' 4"  (1.626 m), weight 49.9 kg (110 lb), SpO2 99 %.  I/O:   Intake/Output Summary (Last 24 hours) at 11/15/16 1347 Last data filed at 11/15/16 0900  Gross per 24 hour  Intake                0 ml  Output                0 ml  Net                0 ml    PHYSICAL EXAMINATION:  GENERAL:  82 y.o.-year-old patient lying in the bed with no acute distress.  EYES: Pupils equal, round,  reactive to light and accommodation. No scleral icterus. Extraocular muscles intact.  HEENT: Head atraumatic, normocephalic. Oropharynx and nasopharynx clear.  NECK:  Supple, no jugular venous distention. No thyroid enlargement, no tenderness.  LUNGS: Normal breath sounds bilaterally, no wheezing, rales,rhonchi or crepitation. No use of accessory muscles of respiration.  CARDIOVASCULAR: S1, S2 normal. No murmurs, rubs, or gallops.  ABDOMEN: Soft, non-tender, non-distended. Bowel sounds present. No organomegaly or mass.  EXTREMITIES: No pedal edema, cyanosis, or clubbing.  NEUROLOGIC: Cranial nerves II through XII are intact. Muscle strength 5/5 in all extremities. Sensation intact. Gait not  checked.  PSYCHIATRIC: The patient is alert and oriented x 3.  SKIN: No obvious rash, lesion, or ulcer.   DATA REVIEW:   CBC   Recent Labs Lab 11/15/16 0147  WBC 5.8  HGB 9.0*  HCT 27.5*  PLT 153    Chemistries   Recent Labs Lab 11/14/16 1514 11/15/16 0147  NA 135 135  K 3.5 3.2*  CL 104 103  CO2 25 25  GLUCOSE 110* 94  BUN 23* 23*  CREATININE 2.68* 2.50*  CALCIUM 7.8* 8.0*  AST 28  --   ALT 8*  --   ALKPHOS 51  --   BILITOT 0.7  --     Microbiology Results   No results found for this or any previous visit (from the past 240 hour(s)).  RADIOLOGY:  No results found.   Management plans discussed with the patient, family and they are in agreement.  CODE STATUS:     Code Status Orders        Start     Ordered   11/14/16 2057  Limited resuscitation (code)  Continuous    Question Answer Comment  In the event of cardiac or respiratory ARREST: Initiate Code Blue, Call Rapid Response Yes   In the event of cardiac or respiratory ARREST: Perform CPR Yes   In the event of cardiac or respiratory ARREST: Perform Intubation/Mechanical Ventilation No   In the event of cardiac or respiratory ARREST: Use NIPPV/BiPAp only if indicated Yes   In the event of cardiac or respiratory  ARREST: Administer ACLS medications if indicated Yes   In the event of cardiac or respiratory ARREST: Perform Defibrillation or Cardioversion if indicated Yes      11/14/16 2056    Code Status History    Date Active Date Inactive Code Status Order ID Comments User Context   11/14/2015  8:12 PM 11/16/2015  7:59 PM Full Code [id number]  [name] Basta, MD Inpatient   10/14/2015  8:48 AM 10/16/2015  8:33 PM Full Code [id number]  [name], MD Inpatient   08/24/2015 11:18 PM 08/28/2015  7:38 PM Full Code [id number]  Hillary Bow, MD ED   12/05/2014  8:51 PM 12/08/2014 10:13 PM Full Code [id number]  Dustin Flock, MD Inpatient   11/03/2014  9:43 AM 11/08/2014 11:46 PM Full Code [id number]  Harrie Foreman, MD Inpatient      TOTAL TIME TAKING CARE OF THIS PATIENT: *40* minutes.    Kandyce Dieguez M.D on 11/15/2016 at 1:47 PM  Between 7am to 6pm - Pager - [phone] After 6pm go to [email or web address] - password [email or web address] [facility]  Office  [phone]  CC: Primary care physician; [name] Merles, MD

## 2016-11-15 NOTE — Progress Notes (Signed)
LVEF is 45% with moderate MR but due CRI cannot give ACE inhibitors. May use hydralazine  And coreg for BP control and can be discharged with f/u office Monday 10 AM for out patient work up.

## 2016-11-15 NOTE — Progress Notes (Signed)
*  PRELIMINARY RESULTS* Echocardiogram 2D Echocardiogram has been performed.  Sherrie Sport 11/15/2016, 2:30 PM

## 2016-11-15 NOTE — Progress Notes (Signed)
Pt given PRN IV 10mg  hydralazine. At 21:30 BP still high 195/111. MD made aware, PRN 10 mg labetalol ordered, given at 21:54. BP at 148/76 at 22:24. Nasal canula with 1.5L O2 administered. No c/o SOB at this time. Pt states she feels much better.

## 2016-11-15 NOTE — Care Management Obs Status (Signed)
[place] NOTIFICATION   Patient Details  Name: SAGAN WURZEL MRN: [id number] Date of Birth: 16-Dec-1934   Medicare Observation Status Notification Given:  No < 24 hours   Katrina Stack, RN 11/15/2016, 2:05 PM

## 2016-11-15 NOTE — Progress Notes (Signed)
[name] Holmes is a 82 y.o. female  [id number]  Primary Cardiologist: Neoma Laming Reason for Consultation: Palpitation and mildly elevated troponin.  HPI: This is a 82 year old African-American female with a past medical history of multiple medical problems including anemia arthritis chronic renal insufficiency presented to the hospital with uncontrolled hypertension with blood pressure 190/124. Patient also had abnormal EKG thus I was asked to evaluate the patient.   Review of Systems: No chest pain no shortness of breath at this time, did have some palpitation on arrival at the emergency room.   Past Medical History:  Diagnosis Date  . Anemia of chronic renal failure   . Arthritis   . CKD (chronic kidney disease)   . ETOH abuse   . Gout   . Hypertension     Medications Prior to Admission  Medication Sig Dispense Refill  . acetaminophen (TYLENOL) 325 MG tablet Take 325-650 mg by mouth every 6 (six) hours as needed for mild pain, moderate pain or fever.    . Calcium-Magnesium-Vitamin D (CALCIUM 500 PO) Take 500 mg by mouth 2 (two) times daily.    . cholecalciferol (VITAMIN D) 1000 units tablet Take 1,000 Units by mouth daily.    . diphenhydrAMINE (BENADRYL) 25 mg capsule Take 25 mg by mouth every 6 (six) hours as needed for itching.    . docusate sodium (COLACE) 100 MG capsule Take 100 mg by mouth daily.    . febuxostat (ULORIC) 40 MG tablet Take 40 mg by mouth daily.    . ferrous sulfate 325 (65 FE) MG tablet Take 650 mg by mouth daily with breakfast.    . folic acid (FOLVITE) 1 MG tablet Take 1 mg by mouth daily.    Marland Kitchen levothyroxine (SYNTHROID, LEVOTHROID) 50 MCG tablet Take 50 mcg by mouth daily before breakfast.     . Multiple Vitamin (MULTIVITAMIN WITH MINERALS) TABS tablet Take 1 tablet by mouth daily.    Marland Kitchen oxybutynin (DITROPAN-XL) 5 MG 24 hr tablet Take 5 mg by mouth at bedtime.    . pantoprazole (PROTONIX) 40 MG tablet Take 1 tablet (40 mg total) by mouth daily. 30  tablet 0  . polyethylene glycol (MIRALAX / GLYCOLAX) packet Take 17 g by mouth daily as needed for mild constipation.     . thiamine (VITAMIN B-1) 100 MG tablet Take 100 mg by mouth daily.    . traZODone (DESYREL) 50 MG tablet Take 50 mg by mouth at bedtime as needed for sleep.     . cefUROXime (CEFTIN) 500 MG tablet Take 1 tablet (500 mg total) by mouth daily. 12 tablet 0  . feeding supplement, ENSURE ENLIVE, (ENSURE ENLIVE) LIQD Take 237 mLs by mouth 2 (two) times daily between meals. 237 mL 12  . magnesium oxide (MAG-OX) 400 MG tablet Take 400 mg by mouth daily.       Marland Kitchen amLODipine  10 mg Oral Daily  . calcium-vitamin D  1 tablet Oral BID  . cholecalciferol  1,000 Units Oral Daily  . docusate sodium  100 mg Oral Daily  . febuxostat  40 mg Oral Daily  . feeding supplement (ENSURE ENLIVE)  237 mL Oral BID BM  . ferrous sulfate  650 mg Oral Q breakfast  . folic acid  1 mg Oral Daily  . heparin  5,000 Units Subcutaneous Q8H  . hydrALAZINE  25 mg Oral Q8H  . levothyroxine  50 mcg Oral QAC breakfast  . magnesium oxide  400 mg Oral Daily  .  metoprolol tartrate  25 mg Oral BID  . multivitamin with minerals  1 tablet Oral Daily  . oxybutynin  5 mg Oral QHS  . pantoprazole  40 mg Oral Daily  . thiamine  100 mg Oral Daily    Infusions:   No Known Allergies  Social History   Social History  . Marital status: Divorced    Spouse name: N/A  . Number of children: N/A  . Years of education: N/A   Occupational History  . Not on file.   Social History Main Topics  . Smoking status: Former Smoker    Types: Cigarettes  . Smokeless tobacco: Never Used  . Alcohol use 7.2 oz/week    6 Cans of beer, 6 Shots of liquor per week     Comment: daily  . Drug use: No  . Sexual activity: No   Other Topics Concern  . Not on file   Social History Narrative  . No narrative on file    Family History  Problem Relation Age of Onset  . Hypertension Father     PHYSICAL EXAM: Vitals:    11/15/16 0451 11/15/16 1117  BP: 129/78 130/71  Pulse: 70 71  Resp: 18 19  Temp: 97.7 F (36.5 C) 98.6 F (37 C)     Intake/Output Summary (Last 24 hours) at 11/15/16 1259 Last data filed at 11/15/16 0900  Gross per 24 hour  Intake                0 ml  Output                0 ml  Net                0 ml    General:  Well appearing. No respiratory difficulty HEENT: normal Neck: supple. no JVD. Carotids 2+ bilat; no bruits. No lymphadenopathy or thryomegaly appreciated. Cor: PMI nondisplaced. Regular rate & rhythm. No rubs, gallops or murmurs. Lungs: clear Abdomen: soft, nontender, nondistended. No hepatosplenomegaly. No bruits or masses. Good bowel sounds. Extremities: no cyanosis, clubbing, rash, edema Neuro: alert & oriented x 3, cranial nerves grossly intact. moves all 4 extremities w/o difficulty. Affect pleasant.  OZH:YQMVHQ sinus rhythm with nonspecific ST-T changes inferolaterally  Results for orders placed or performed during the hospital encounter of 11/14/16 (from the past 24 hour(s))  CBC with Differential/Platelet     Status: Abnormal   Collection Time: 11/14/16  3:14 PM  Result Value Ref Range   WBC 5.0 3.6 - 11.0 K/uL   RBC 3.28 (L) 3.80 - 5.20 MIL/uL   Hemoglobin 9.8 (L) 12.0 - 16.0 g/dL   HCT 29.3 (L) 35.0 - 47.0 %   MCV 89.3 80.0 - 100.0 fL   MCH 29.8 26.0 - 34.0 pg   MCHC 33.4 32.0 - 36.0 g/dL   RDW 13.3 11.5 - 14.5 %   Platelets 147 (L) 150 - 440 K/uL   Neutrophils Relative % 48 %   Neutro Abs 2.4 1.4 - 6.5 K/uL   Lymphocytes Relative 38 %   Lymphs Abs 1.9 1.0 - 3.6 K/uL   Monocytes Relative 7 %   Monocytes Absolute 0.3 0.2 - 0.9 K/uL   Eosinophils Relative 6 %   Eosinophils Absolute 0.3 0 - 0.7 K/uL   Basophils Relative 1 %   Basophils Absolute 0.1 0 - 0.1 K/uL  Comprehensive metabolic panel     Status: Abnormal   Collection Time: 11/14/16  3:14 PM  Result Value Ref  Range   Sodium 135 135 - 145 mmol/L   Potassium 3.5 3.5 - 5.1 mmol/L    Chloride 104 101 - 111 mmol/L   CO2 25 22 - 32 mmol/L   Glucose, Bld 110 (H) 65 - 99 mg/dL   BUN 23 (H) 6 - 20 mg/dL   Creatinine, Ser 2.68 (H) 0.44 - 1.00 mg/dL   Calcium 7.8 (L) 8.9 - 10.3 mg/dL   Total Protein 6.8 6.5 - 8.1 g/dL   Albumin 2.7 (L) 3.5 - 5.0 g/dL   AST 28 15 - 41 U/L   ALT 8 (L) 14 - 54 U/L   Alkaline Phosphatase 51 38 - 126 U/L   Total Bilirubin 0.7 0.3 - 1.2 mg/dL   GFR calc non Af Amer 16 (L) >60 mL/min   GFR calc Af Amer 18 (L) >60 mL/min   Anion gap 6 5 - 15  Troponin I     Status: Abnormal   Collection Time: 11/14/16  3:14 PM  Result Value Ref Range   Troponin I 0.05 (HH) <0.03 ng/mL  Troponin I     Status: Abnormal   Collection Time: 11/14/16  5:56 PM  Result Value Ref Range   Troponin I 0.06 (HH) <0.03 ng/mL  Troponin I     Status: Abnormal   Collection Time: 11/14/16  9:46 PM  Result Value Ref Range   Troponin I 0.07 (HH) <0.03 ng/mL  Troponin I     Status: Abnormal   Collection Time: 11/15/16  1:47 AM  Result Value Ref Range   Troponin I 0.07 (HH) <0.03 ng/mL  Basic metabolic panel     Status: Abnormal   Collection Time: 11/15/16  1:47 AM  Result Value Ref Range   Sodium 135 135 - 145 mmol/L   Potassium 3.2 (L) 3.5 - 5.1 mmol/L   Chloride 103 101 - 111 mmol/L   CO2 25 22 - 32 mmol/L   Glucose, Bld 94 65 - 99 mg/dL   BUN 23 (H) 6 - 20 mg/dL   Creatinine, Ser 2.50 (H) 0.44 - 1.00 mg/dL   Calcium 8.0 (L) 8.9 - 10.3 mg/dL   GFR calc non Af Amer 17 (L) >60 mL/min   GFR calc Af Amer 20 (L) >60 mL/min   Anion gap 7 5 - 15  CBC     Status: Abnormal   Collection Time: 11/15/16  1:47 AM  Result Value Ref Range   WBC 5.8 3.6 - 11.0 K/uL   RBC 3.14 (L) 3.80 - 5.20 MIL/uL   Hemoglobin 9.0 (L) 12.0 - 16.0 g/dL   HCT 27.5 (L) 35.0 - 47.0 %   MCV 87.8 80.0 - 100.0 fL   MCH 28.8 26.0 - 34.0 pg   MCHC 32.8 32.0 - 36.0 g/dL   RDW 13.3 11.5 - 14.5 %   Platelets 153 150 - 440 K/uL   No results found.   ASSESSMENT AND PLAN:Mildly elevated troponin  with no significant symptoms presented to the hospital with uncontrolled hypertension. EKG has nonspecific ST-T changes with mildly elevated troponin and no chest pain. Patient can be discharged with follow-up 9:30 Monday in my office and will do outpatient Persantine Myoview.  KHAN,SHAUKAT A

## 2017-05-31 ENCOUNTER — Other Ambulatory Visit: Payer: Self-pay | Admitting: Nurse Practitioner

## 2017-09-17 ENCOUNTER — Emergency Department: Payer: Medicare Other

## 2017-09-17 ENCOUNTER — Other Ambulatory Visit: Payer: Self-pay

## 2017-09-17 ENCOUNTER — Inpatient Hospital Stay
Admission: EM | Admit: 2017-09-17 | Discharge: 2017-09-23 | DRG: 682 | Disposition: A | Discharge status: Hospice | Payer: Medicare Other | Attending: Internal Medicine | Admitting: Internal Medicine

## 2017-09-17 DIAGNOSIS — R748 Abnormal levels of other serum enzymes: Secondary | ICD-10-CM | POA: Diagnosis present

## 2017-09-17 DIAGNOSIS — R531 Weakness: Secondary | ICD-10-CM | POA: Diagnosis present

## 2017-09-17 DIAGNOSIS — Z8249 Family history of ischemic heart disease and other diseases of the circulatory system: Secondary | ICD-10-CM

## 2017-09-17 DIAGNOSIS — F1011 Alcohol abuse, in remission: Secondary | ICD-10-CM | POA: Diagnosis present

## 2017-09-17 DIAGNOSIS — M25462 Effusion, left knee: Secondary | ICD-10-CM | POA: Diagnosis present

## 2017-09-17 DIAGNOSIS — I4581 Long QT syndrome: Secondary | ICD-10-CM | POA: Diagnosis present

## 2017-09-17 DIAGNOSIS — Z79890 Hormone replacement therapy (postmenopausal): Secondary | ICD-10-CM | POA: Diagnosis not present

## 2017-09-17 DIAGNOSIS — N3000 Acute cystitis without hematuria: Secondary | ICD-10-CM | POA: Diagnosis present

## 2017-09-17 DIAGNOSIS — M25461 Effusion, right knee: Secondary | ICD-10-CM | POA: Diagnosis present

## 2017-09-17 DIAGNOSIS — E860 Dehydration: Secondary | ICD-10-CM | POA: Diagnosis present

## 2017-09-17 DIAGNOSIS — J181 Lobar pneumonia, unspecified organism: Secondary | ICD-10-CM | POA: Diagnosis present

## 2017-09-17 DIAGNOSIS — Z90710 Acquired absence of both cervix and uterus: Secondary | ICD-10-CM | POA: Diagnosis not present

## 2017-09-17 DIAGNOSIS — N19 Unspecified kidney failure: Secondary | ICD-10-CM

## 2017-09-17 DIAGNOSIS — N179 Acute kidney failure, unspecified: Secondary | ICD-10-CM | POA: Diagnosis not present

## 2017-09-17 DIAGNOSIS — Z87891 Personal history of nicotine dependence: Secondary | ICD-10-CM

## 2017-09-17 DIAGNOSIS — N189 Chronic kidney disease, unspecified: Secondary | ICD-10-CM

## 2017-09-17 DIAGNOSIS — H538 Other visual disturbances: Secondary | ICD-10-CM | POA: Diagnosis present

## 2017-09-17 DIAGNOSIS — N184 Chronic kidney disease, stage 4 (severe): Secondary | ICD-10-CM | POA: Diagnosis present

## 2017-09-17 DIAGNOSIS — I129 Hypertensive chronic kidney disease with stage 1 through stage 4 chronic kidney disease, or unspecified chronic kidney disease: Secondary | ICD-10-CM | POA: Diagnosis present

## 2017-09-17 DIAGNOSIS — J810 Acute pulmonary edema: Secondary | ICD-10-CM | POA: Diagnosis not present

## 2017-09-17 DIAGNOSIS — G9341 Metabolic encephalopathy: Secondary | ICD-10-CM | POA: Diagnosis not present

## 2017-09-17 DIAGNOSIS — N170 Acute kidney failure with tubular necrosis: Secondary | ICD-10-CM | POA: Diagnosis present

## 2017-09-17 DIAGNOSIS — R110 Nausea: Secondary | ICD-10-CM | POA: Diagnosis not present

## 2017-09-17 DIAGNOSIS — Z66 Do not resuscitate: Secondary | ICD-10-CM | POA: Diagnosis present

## 2017-09-17 DIAGNOSIS — Z515 Encounter for palliative care: Secondary | ICD-10-CM | POA: Diagnosis not present

## 2017-09-17 DIAGNOSIS — Z79899 Other long term (current) drug therapy: Secondary | ICD-10-CM

## 2017-09-17 DIAGNOSIS — M109 Gout, unspecified: Secondary | ICD-10-CM | POA: Diagnosis present

## 2017-09-17 DIAGNOSIS — R011 Cardiac murmur, unspecified: Secondary | ICD-10-CM | POA: Diagnosis present

## 2017-09-17 DIAGNOSIS — J189 Pneumonia, unspecified organism: Secondary | ICD-10-CM

## 2017-09-17 DIAGNOSIS — D631 Anemia in chronic kidney disease: Secondary | ICD-10-CM

## 2017-09-17 DIAGNOSIS — R4781 Slurred speech: Secondary | ICD-10-CM | POA: Diagnosis present

## 2017-09-17 LAB — IRON AND TIBC
IRON: 116 ug/dL (ref 28–170)
Saturation Ratios: 99 % — ABNORMAL HIGH (ref 10.4–31.8)
TIBC: 118 ug/dL — AB (ref 250–450)
UIBC: 2 ug/dL

## 2017-09-17 LAB — TROPONIN I
TROPONIN I: 0.03 ng/mL — AB (ref <0.03)
TROPONIN I: 0.03 ng/mL — AB (ref <0.03)
Troponin I: <0.03 ng/mL (ref <0.03)

## 2017-09-17 LAB — HEMOGLOBIN AND HEMATOCRIT, BLOOD
HCT: 21.9 % — ABNORMAL LOW (ref 35.0–47.0)
Hemoglobin: 7.1 g/dL — ABNORMAL LOW (ref 12.0–16.0)

## 2017-09-17 LAB — FERRITIN: Ferritin: 1500 ng/mL — ABNORMAL HIGH (ref 11–307)

## 2017-09-17 LAB — COMPREHENSIVE METABOLIC PANEL
ALK PHOS: 62 U/L (ref 38–126)
ALT: 7 U/L — ABNORMAL LOW (ref 14–54)
AST: 18 U/L (ref 15–41)
Albumin: 2.5 g/dL — ABNORMAL LOW (ref 3.5–5.0)
Anion gap: 11 (ref 5–15)
BILIRUBIN TOTAL: 0.4 mg/dL (ref 0.3–1.2)
BUN: 68 mg/dL — ABNORMAL HIGH (ref 6–20)
CALCIUM: 7.3 mg/dL — AB (ref 8.9–10.3)
CO2: 18 mmol/L — ABNORMAL LOW (ref 22–32)
Chloride: 108 mmol/L (ref 101–111)
Creatinine, Ser: 9.33 mg/dL — ABNORMAL HIGH (ref 0.44–1.00)
GFR calc Af Amer: 4 mL/min — ABNORMAL LOW (ref >60)
GFR, EST NON AFRICAN AMERICAN: 3 mL/min — AB (ref >60)
GLUCOSE: 88 mg/dL (ref 65–99)
Potassium: 4.2 mmol/L (ref 3.5–5.1)
Sodium: 137 mmol/L (ref 135–145)
TOTAL PROTEIN: 6.6 g/dL (ref 6.5–8.1)

## 2017-09-17 LAB — RETICULOCYTES
RBC.: 2.16 MIL/uL — AB (ref 3.80–5.20)
RETIC CT PCT: 3.1 % (ref 0.4–3.1)
Retic Count, Absolute: 67 10*3/uL (ref 19.0–183.0)

## 2017-09-17 LAB — CBC WITH DIFFERENTIAL/PLATELET
Basophils Absolute: 0 10*3/uL (ref 0–0.1)
Basophils Relative: 1 %
Eosinophils Absolute: 0.5 10*3/uL (ref 0–0.7)
Eosinophils Relative: 5 %
HEMATOCRIT: 19.4 % — AB (ref 35.0–47.0)
HEMOGLOBIN: 6 g/dL — AB (ref 12.0–16.0)
LYMPHS ABS: 1.4 10*3/uL (ref 1.0–3.6)
LYMPHS PCT: 16 %
MCH: 26.5 pg (ref 26.0–34.0)
MCHC: 30.9 g/dL — ABNORMAL LOW (ref 32.0–36.0)
MCV: 85.7 fL (ref 80.0–100.0)
MONOS PCT: 7 %
Monocytes Absolute: 0.7 10*3/uL (ref 0.2–0.9)
NEUTROS ABS: 6.5 10*3/uL (ref 1.4–6.5)
NEUTROS PCT: 71 %
Platelets: 238 10*3/uL (ref 150–440)
RBC: 2.26 MIL/uL — ABNORMAL LOW (ref 3.80–5.20)
RDW: 14.8 % — AB (ref 11.5–14.5)
WBC: 9.1 10*3/uL (ref 3.6–11.0)

## 2017-09-17 LAB — URINALYSIS, COMPLETE (UACMP) WITH MICROSCOPIC
BILIRUBIN URINE: NEGATIVE
Glucose, UA: NEGATIVE mg/dL
Ketones, ur: NEGATIVE mg/dL
NITRITE: NEGATIVE
PH: 5 (ref 5.0–8.0)
Protein, ur: 100 mg/dL — AB
SPECIFIC GRAVITY, URINE: 1.005 (ref 1.005–1.030)

## 2017-09-17 LAB — URIC ACID: URIC ACID, SERUM: 3.2 mg/dL (ref 2.3–6.6)

## 2017-09-17 LAB — PREPARE RBC (CROSSMATCH)

## 2017-09-17 LAB — FOLATE: FOLATE: 86.6 ng/mL (ref >5.9)

## 2017-09-17 LAB — VITAMIN B12: Vitamin B-12: 573 pg/mL (ref 180–914)

## 2017-09-17 MED ORDER — METOPROLOL TARTRATE 50 MG PO TABS
50.0000 mg | ORAL_TABLET | Freq: Two times a day (BID) | ORAL | Status: DC
  Administered 2017-09-17: 17:00:00 50 mg via ORAL
  Filled 2017-09-17: qty 1

## 2017-09-17 MED ORDER — ONDANSETRON HCL 4 MG/2ML IJ SOLN
4.0000 mg | Freq: Four times a day (QID) | INTRAMUSCULAR | Status: DC | PRN
  Administered 2017-09-17 – 2017-09-22 (×7): 4 mg via INTRAVENOUS
  Filled 2017-09-17 (×7): qty 2

## 2017-09-17 MED ORDER — EPOETIN ALFA 10000 UNIT/ML IJ SOLN
20000.0000 [IU] | Freq: Once | INTRAMUSCULAR | Status: AC
  Administered 2017-09-17: 20000 [IU] via SUBCUTANEOUS
  Filled 2017-09-17: qty 2

## 2017-09-17 MED ORDER — FEBUXOSTAT 40 MG PO TABS
40.0000 mg | ORAL_TABLET | Freq: Every day | ORAL | Status: DC
  Administered 2017-09-17 – 2017-09-18 (×2): 40 mg via ORAL
  Filled 2017-09-17 (×2): qty 1

## 2017-09-17 MED ORDER — SODIUM CHLORIDE 0.9 % IV SOLN
10.0000 mL/h | Freq: Once | INTRAVENOUS | Status: AC
  Administered 2017-09-17: 10 mL/h via INTRAVENOUS

## 2017-09-17 MED ORDER — FOLIC ACID 1 MG PO TABS
1.0000 mg | ORAL_TABLET | Freq: Every day | ORAL | Status: DC
  Administered 2017-09-17 – 2017-09-20 (×4): 1 mg via ORAL
  Filled 2017-09-17 (×5): qty 1

## 2017-09-17 MED ORDER — METOPROLOL TARTRATE 50 MG PO TABS
50.0000 mg | ORAL_TABLET | Freq: Two times a day (BID) | ORAL | Status: DC
  Administered 2017-09-18 – 2017-09-21 (×7): 50 mg via ORAL
  Filled 2017-09-17 (×8): qty 1

## 2017-09-17 MED ORDER — HEPARIN SODIUM (PORCINE) 5000 UNIT/ML IJ SOLN
5000.0000 [IU] | Freq: Three times a day (TID) | INTRAMUSCULAR | Status: DC
  Administered 2017-09-17 – 2017-09-22 (×14): 5000 [IU] via SUBCUTANEOUS
  Filled 2017-09-17 (×13): qty 1

## 2017-09-17 MED ORDER — ADULT MULTIVITAMIN W/MINERALS CH
1.0000 | ORAL_TABLET | Freq: Every day | ORAL | Status: DC
  Administered 2017-09-17 – 2017-09-20 (×4): 1 via ORAL
  Filled 2017-09-17 (×5): qty 1

## 2017-09-17 MED ORDER — LEVOFLOXACIN IN D5W 750 MG/150ML IV SOLN
750.0000 mg | Freq: Once | INTRAVENOUS | Status: DC
  Filled 2017-09-17: qty 150

## 2017-09-17 MED ORDER — SODIUM CHLORIDE 0.9 % IV SOLN
INTRAVENOUS | Status: AC
  Filled 2017-09-17: qty 500

## 2017-09-17 MED ORDER — EPOETIN ALFA 20000 UNIT/ML IJ SOLN
20000.0000 [IU] | Freq: Once | INTRAMUSCULAR | Status: DC
  Filled 2017-09-17: qty 1

## 2017-09-17 MED ORDER — ONDANSETRON HCL 4 MG PO TABS
4.0000 mg | ORAL_TABLET | Freq: Four times a day (QID) | ORAL | Status: DC | PRN
  Administered 2017-09-19 – 2017-09-21 (×4): 4 mg via ORAL
  Filled 2017-09-17 (×4): qty 1

## 2017-09-17 MED ORDER — MAGNESIUM OXIDE 400 (241.3 MG) MG PO TABS
400.0000 mg | ORAL_TABLET | Freq: Every day | ORAL | Status: DC
  Administered 2017-09-17 – 2017-09-20 (×4): 400 mg via ORAL
  Filled 2017-09-17 (×5): qty 1

## 2017-09-17 MED ORDER — VITAMIN D 1000 UNITS PO TABS
1000.0000 [IU] | ORAL_TABLET | Freq: Every day | ORAL | Status: DC
  Administered 2017-09-17 – 2017-09-21 (×5): 1000 [IU] via ORAL
  Filled 2017-09-17 (×5): qty 1

## 2017-09-17 MED ORDER — AMLODIPINE BESYLATE 10 MG PO TABS
10.0000 mg | ORAL_TABLET | Freq: Every day | ORAL | Status: DC
  Administered 2017-09-17 – 2017-09-21 (×5): 10 mg via ORAL
  Filled 2017-09-17 (×5): qty 1

## 2017-09-17 MED ORDER — LEVOTHYROXINE SODIUM 50 MCG PO TABS
50.0000 ug | ORAL_TABLET | Freq: Every day | ORAL | Status: DC
  Administered 2017-09-18 – 2017-09-22 (×5): 50 ug via ORAL
  Filled 2017-09-17 (×5): qty 1

## 2017-09-17 MED ORDER — OXYBUTYNIN CHLORIDE ER 5 MG PO TB24
5.0000 mg | ORAL_TABLET | Freq: Every day | ORAL | Status: DC
  Administered 2017-09-17: 5 mg via ORAL
  Filled 2017-09-17 (×2): qty 1

## 2017-09-17 MED ORDER — SODIUM CHLORIDE 0.9 % IV SOLN
500.0000 mg | INTRAVENOUS | Status: DC
  Administered 2017-09-17: 500 mg via INTRAVENOUS
  Filled 2017-09-17 (×2): qty 500

## 2017-09-17 MED ORDER — FERROUS SULFATE 325 (65 FE) MG PO TABS: 650.0000 mg | ORAL_TABLET | Freq: Every day | ORAL | Status: DC

## 2017-09-17 MED ORDER — TRAZODONE HCL 50 MG PO TABS
50.0000 mg | ORAL_TABLET | Freq: Every day | ORAL | Status: DC
  Administered 2017-09-17 – 2017-09-19 (×3): 50 mg via ORAL
  Filled 2017-09-17 (×3): qty 1

## 2017-09-17 MED ORDER — ACETAMINOPHEN 325 MG PO TABS: 325.0000 mg | ORAL_TABLET | Freq: Four times a day (QID) | ORAL | Status: DC | PRN

## 2017-09-17 MED ORDER — CALCIUM CARBONATE-VITAMIN D 500-200 MG-UNIT PO TABS
2.0000 | ORAL_TABLET | Freq: Every day | ORAL | Status: DC
  Administered 2017-09-17 – 2017-09-20 (×4): 2 via ORAL
  Filled 2017-09-17 (×5): qty 2

## 2017-09-17 MED ORDER — PANTOPRAZOLE SODIUM 40 MG PO TBEC
40.0000 mg | DELAYED_RELEASE_TABLET | Freq: Every day | ORAL | Status: DC
  Administered 2017-09-17 – 2017-09-20 (×4): 40 mg via ORAL
  Filled 2017-09-17 (×5): qty 1

## 2017-09-17 MED ORDER — MIRTAZAPINE 15 MG PO TABS
15.0000 mg | ORAL_TABLET | Freq: Every day | ORAL | Status: DC
  Administered 2017-09-17 – 2017-09-21 (×5): 15 mg via ORAL
  Filled 2017-09-17 (×5): qty 1

## 2017-09-17 MED ORDER — SODIUM CHLORIDE 0.9 % IV SOLN
1.0000 g | INTRAVENOUS | Status: DC
  Administered 2017-09-17 – 2017-09-20 (×4): 1 g via INTRAVENOUS
  Filled 2017-09-17 (×5): qty 10

## 2017-09-17 MED ORDER — ENSURE ENLIVE PO LIQD
237.0000 mL | Freq: Two times a day (BID) | ORAL | Status: DC
  Administered 2017-09-17 – 2017-09-20 (×5): 237 mL via ORAL

## 2017-09-17 MED ORDER — SODIUM CHLORIDE 0.9 % IV SOLN
INTRAVENOUS | Status: DC
  Administered 2017-09-17 – 2017-09-20 (×3): via INTRAVENOUS

## 2017-09-17 MED ORDER — VITAMIN B-1 100 MG PO TABS
100.0000 mg | ORAL_TABLET | Freq: Every day | ORAL | Status: DC
  Administered 2017-09-17 – 2017-09-20 (×4): 100 mg via ORAL
  Filled 2017-09-17 (×5): qty 1

## 2017-09-17 NOTE — ED Notes (Signed)
Pt returned from xray, given warm blanket.

## 2017-09-17 NOTE — ED Notes (Signed)
Fecal occult negative 

## 2017-09-17 NOTE — ED Triage Notes (Signed)
Pt arrives [facility] from home for generalized weakness that comes and goes that began last night. Pt also c/o SOB with exertion. No distress noted upon arrival. Denies urinary symptoms. Denies N&V&D. Denies blood loss. Alert and oriented.    Chart states pt has a legal guardian but this RN unable to find any information from previous visits about legal guardian information. Pt doesn't offer any information as to a name or number for legal guardian.   Pt uses a walker at home. VSS with EMS. Lung sounds clear. Hx HTN and gout. NKA.

## 2017-09-17 NOTE — Progress Notes (Signed)
[facility], [place] 09/17/17  Subjective:   Patient known to our practice from previous admissions and outpatient follow-up She has known chronic kidney disease She presents this time for generalized weakness She also reports nausea and vomiting yesterday.  She also states she has been nauseous today therefore oral intake has been poor Baseline creatinine is 2.50, GFR 20 from May 2018 Admission creatinine today is 9.33, GFR 4 Nephrology consult has been requested for evaluation  Objective:  Vital signs in last 24 hours:  Temp:  [97.6 F (36.4 C)-98 F (36.7 C)] 97.6 F (36.4 C) (03/20 1615) Pulse Rate:  [63-66] 63 (03/20 1615) Resp:  [15-22] 18 (03/20 1615) BP: (157-184)/(66-97) 184/86 (03/20 1615) SpO2:  [96 %-100 %] 99 % (03/20 1615) Weight:  [60.4 kg (133 lb 1.6 oz)-68 kg (150 lb)] 60.4 kg (133 lb 1.6 oz) (03/20 1551)  Weight change:  Filed Weights   09/17/17 1220 09/17/17 1551  Weight: 68 kg (150 lb) 60.4 kg (133 lb 1.6 oz)    Intake/Output:    Intake/Output Summary (Last 24 hours) at 09/17/2017 1700 Last data filed at 09/17/2017 1600 Gross per 24 hour  Intake 0 ml  Output -  Net 0 ml     Physical Exam: General:  No acute distress, laying in the bed  HEENT  anicteric, moist oral mucous membranes  Neck  supple  Pulm/lungs  normal breathing effort  CVS/Heart  irregular rhythm  Abdomen:   Soft, nontender  Extremities:  Trace edema  Neurologic:  Alert, oriented to self  Skin:  No acute rashes          Basic Metabolic Panel:  Recent Labs  Lab 09/17/17 1222  NA 137  K 4.2  CL 108  CO2 18*  GLUCOSE 88  BUN 68*  CREATININE 9.33*  CALCIUM 7.3*     CBC: Recent Labs  Lab 09/17/17 1222  WBC 9.1  NEUTROABS 6.5  HGB 6.0*  HCT 19.4*  MCV 85.7  PLT 238     No results found for: HEPBSAG, HEPBSAB, HEPBIGM    Microbiology:  No results found for this or any previous visit (from the past 240 hour(s)).  Coagulation  Studies: No results for input(s): LABPROT, INR in the last 72 hours.  Urinalysis: Recent Labs    09/17/17 1222  COLORURINE YELLOW*  LABSPEC 1.005  PHURINE 5.0  GLUCOSEU NEGATIVE  HGBUR SMALL*  BILIRUBINUR NEGATIVE  KETONESUR NEGATIVE  PROTEINUR 100*  NITRITE NEGATIVE  LEUKOCYTESUR LARGE*      Imaging: Dg Chest 2 View  Result Date: 09/17/2017 CLINICAL DATA:  Generalize weakness intermittently as well as dyspnea on exertion. EXAM: CHEST - 2 VIEW COMPARISON:  Chest x-ray of Nov 14, 2015 FINDINGS: The lungs are adequately inflated. There is an infiltrate in the right lower lobe which is new. There is no pleural effusion. The cardiac silhouette is mildly enlarged. The pulmonary vascularity is not engorged. There is mild multilevel degenerative disc disease of the thoracic spine. IMPRESSION: Right lower lobe pneumonia. Underlying chronic bronchitic changes. No pulmonary edema. Followup PA and lateral chest X-ray is recommended in 3-4 weeks following trial of antibiotic therapy to ensure resolution and exclude underlying malignancy. Electronically Signed   By: David  Martinique M.D.   On: 09/17/2017 12:50     Medications:   . sodium chloride    . azithromycin    . cefTRIAXone (ROCEPHIN)  IV 1 g (09/17/17 1526)   . amLODipine  10 mg Oral Daily  .  calcium-vitamin D  2 tablet Oral Daily  . cholecalciferol  1,000 Units Oral Daily  . epoetin (EPOGEN/PROCRIT) injection  20,000 Units Subcutaneous Once  . febuxostat  40 mg Oral Daily  . feeding supplement (ENSURE ENLIVE)  237 mL Oral BID BM  . [START ON 09/18/2017] ferrous sulfate  650 mg Oral Q breakfast  . folic acid  1 mg Oral Daily  . heparin  5,000 Units Subcutaneous Q8H  . [START ON 09/18/2017] levothyroxine  50 mcg Oral QAC breakfast  . magnesium oxide  400 mg Oral Daily  . metoprolol tartrate  50 mg Oral BID  . mirtazapine  15 mg Oral QHS  . multivitamin with minerals  1 tablet Oral Daily  . oxybutynin  5 mg Oral QHS  .  pantoprazole  40 mg Oral Daily  . thiamine  100 mg Oral Daily  . traZODone  50 mg Oral QHS   acetaminophen, ondansetron **OR** ondansetron (ZOFRAN) IV  Assessment/ Plan:  83 y.o. African-American female chronic kidney disease, history of alcohol abuse, gout, hypertension, anemia  presents for generalized weakness, nausea, poor appetite  1.  Acute renal failure on chronic kidney disease stage IV Baseline creatinine 2.50, GFR 20 from May 2018 2.  Urinary tract infection 3.  Hypertension 4.  Anemia, of chronic kidney disease  Patient may have developed acute renal failure/ATN from underlying concurrent infection We will obtain renal ultrasound gentle IV hydration, treatment of UTI Observe over the next 1-2 days To see if there is any improvement Will need to discuss with family regarding goals of care     LOS: 0 [name] Holmes 3/20/20195:00 PM  [name], [name]  Note: This note was prepared with Dragon dictation. Any transcription errors are unintentional

## 2017-09-17 NOTE — ED Notes (Signed)
Spoke to patient's niece [name] over the phone who is patient's power of attorney and she also gave permission for patient to have a blood transfusion.  When this RN asked niece if she was patient's legal guardian she stated, "I"m everything for her, I help her make decisions."

## 2017-09-17 NOTE — H&P (Signed)
[facility] [place] NAME: [name] Holmes    MR#:  [id number]  DATE OF BIRTH:  06-08-1935  DATE OF ADMISSION:  09/17/2017  PRIMARY CARE PHYSICIAN: Marguerita Merles, MD   REQUESTING/REFERRING PHYSICIAN: Dr. Lenise Arena  CHIEF COMPLAINT:   Chief Complaint  Patient presents with  . Weakness    HISTORY OF PRESENT ILLNESS:  [name] Holmes  is a 83 y.o. female with a known history of CKD, anemia of chronic disease, hypertension, alcohol abuse presents to hospital secondary to extreme weakness and noted to be in acute renal failure.  Patient is not a great historian. She was last seen in the nephrology clinic last year and her creatinine was 2.5 at the time. She does say that her appetite has been poor with increased GI symptoms, nausea and vomiting and a couple of days of diarrhea over the last week. She also has known history of anemia and required transfusion when she was in the hospital last year. EGD and colonoscopy did not reveal any significant GI sources and also had an outpatient capsule endoscopy. Seen by oncology as an outpatient for her anemia and was started on Procrit every 2 weeks but has not followed up at the clinic since then.Patient has been complaining of significant weakness, dyspnea on exertion and er home health nurse called EMS today. Noted to be anemic again with a hemoglobin of 6 and noted to be in acute renal failure with creatinine of 9 today.  PAST MEDICAL HISTORY:   Past Medical History:  Diagnosis Date  . Anemia of chronic renal failure   . Arthritis   . CKD (chronic kidney disease)   . ETOH abuse   . Gout   . Hypertension     PAST SURGICAL HISTORY:   Past Surgical History:  Procedure Laterality Date  . ABDOMINAL HYSTERECTOMY    . COLONOSCOPY WITH PROPOFOL N/A 12/08/2014   Procedure: COLONOSCOPY WITH PROPOFOL;  Surgeon: Josefine Class, MD;  Location: [facility] ENDOSCOPY;  Service: Endoscopy;  Laterality:  N/A;  . ESOPHAGOGASTRODUODENOSCOPY (EGD) WITH PROPOFOL N/A 11/08/2014   Procedure: ESOPHAGOGASTRODUODENOSCOPY (EGD) WITH PROPOFOL;  Surgeon: Lollie Sails, MD;  Location: [facility] ENDOSCOPY;  Service: Endoscopy;  Laterality: N/A;  . GIVENS CAPSULE STUDY N/A 08/26/2015   Procedure: GIVENS CAPSULE STUDY;  Surgeon: Josefine Class, MD;  Location: [facility] ENDOSCOPY;  Service: Endoscopy;  Laterality: N/A;    SOCIAL HISTORY:   Social History   Tobacco Use  . Smoking status: Former Smoker    Types: Cigarettes  . Smokeless tobacco: Never Used  Substance Use Topics  . Alcohol use: Yes    Alcohol/week: 7.2 oz    Types: 6 Cans of beer, 6 Shots of liquor per week    Comment: daily- beer and vodka    FAMILY HISTORY:   Family History  Problem Relation Age of Onset  . Hypertension Father   . Hypertension Mother     DRUG ALLERGIES:  No Known Allergies  REVIEW OF SYSTEMS:   Review of Systems  Constitutional: Positive for malaise/fatigue. Negative for chills, fever and weight loss.  HENT: Negative for ear discharge, ear pain, hearing loss and nosebleeds.   Eyes: Positive for blurred vision. Negative for double vision and photophobia.  Respiratory: Negative for cough, hemoptysis, shortness of breath and wheezing.   Cardiovascular: Negative for chest pain, palpitations, orthopnea and leg swelling.  Gastrointestinal: Negative for abdominal pain, constipation, diarrhea, melena, nausea and vomiting.  Genitourinary: Negative  for dysuria, frequency, hematuria and urgency.  Musculoskeletal: Positive for joint pain and myalgias. Negative for back pain and neck pain.  Skin: Negative for rash.  Neurological: Negative for dizziness, tingling, sensory change, speech change, focal weakness and headaches.  Endo/Heme/Allergies: Does not bruise/bleed easily.  Psychiatric/Behavioral: Negative for depression.    MEDICATIONS AT HOME:   Prior to Admission medications   Medication Sig Start Date End Date  Taking? Authorizing Provider  acetaminophen (TYLENOL) 325 MG tablet Take 325-650 mg by mouth every 6 (six) hours as needed for mild pain, moderate pain or fever.   Yes [provider]  amLODipine (NORVASC) 10 MG tablet Take 1 tablet (10 mg total) by mouth daily. 11/16/16  Yes Fritzi Mandes, MD  Calcium Carbonate-Vitamin D3 (CALCIUM 600-D) 600-400 MG-UNIT TABS Take 2 tablets by mouth daily.   Yes [provider]  cholecalciferol (VITAMIN D) 1000 units tablet Take 1,000 Units by mouth daily.   Yes [provider]  docusate sodium (COLACE) 100 MG capsule Take 100 mg by mouth daily.   Yes [provider]  febuxostat (ULORIC) 40 MG tablet Take 40 mg by mouth daily.   Yes [provider]  feeding supplement, ENSURE ENLIVE, (ENSURE ENLIVE) LIQD Take 237 mLs by mouth 2 (two) times daily between meals. 11/16/15  Yes Mody, Ulice Bold, MD  ferrous sulfate 325 (65 FE) MG tablet Take 650 mg by mouth daily with breakfast.   Yes [provider]  folic acid (FOLVITE) 1 MG tablet Take 1 mg by mouth daily.   Yes [provider]  levothyroxine (SYNTHROID, LEVOTHROID) 50 MCG tablet Take 50 mcg by mouth daily before breakfast.    Yes [provider]  magnesium oxide (MAG-OX) 400 MG tablet Take 400 mg by mouth daily.   Yes [provider]  metoprolol tartrate (LOPRESSOR) 50 MG tablet Take 50 mg by mouth 2 (two) times daily.   Yes [provider]  mirtazapine (REMERON) 15 MG tablet Take 15 mg by mouth at bedtime.   Yes [provider]  Multiple Vitamin (MULTIVITAMIN WITH MINERALS) TABS tablet Take 1 tablet by mouth daily.   Yes [provider]  oxybutynin (DITROPAN-XL) 5 MG 24 hr tablet Take 5 mg by mouth at bedtime.   Yes [provider]  pantoprazole (PROTONIX) 40 MG tablet Take 1 tablet (40 mg total) by mouth daily. 11/16/15  Yes Mody, Ulice Bold, MD  thiamine (VITAMIN B-1) 100 MG tablet Take 100 mg by mouth daily.    Yes [provider]  traZODone (DESYREL) 50 MG tablet Take 50 mg by mouth at bedtime.    Yes [provider]  metoprolol tartrate (LOPRESSOR) 25 MG tablet Take 1 tablet (25 mg total) by mouth 2 (two) times daily. Patient not taking: Reported on 09/17/2017 11/15/16   Fritzi Mandes, MD      VITAL SIGNS:  Blood pressure (!) 184/86, pulse 63, temperature 97.6 F (36.4 C), temperature source Oral, resp. rate 18, height 5\' 4"  (1.626 m), weight 60.4 kg (133 lb 1.6 oz), SpO2 99 %.  PHYSICAL EXAMINATION:   Physical Exam  GENERAL:  83 y.o.-year-old patient lying in the bed with no acute distress.  EYES: Pupils equal, round, reactive to light and accommodation. No scleral icterus. Extraocular muscles intact.  HEENT: Head atraumatic, normocephalic. Oropharynx and nasopharynx clear.  NECK:  Supple, no jugular venous distention. No thyroid enlargement, no tenderness.  LUNGS: Normal breath sounds bilaterally, no wheezing, rales,rhonchi or crepitation. No use of accessory muscles of  respiration. Decreased bibasilar breath sounds noted CARDIOVASCULAR: S1, S2 normal. No  rubs, or gallops. 2/6 systolic murmur present ABDOMEN: Soft, nontender, nondistended. Bowel sounds present. No organomegaly or mass.  EXTREMITIES: No pedal edema, cyanosis, or clubbing.  NEUROLOGIC: Cranial nerves II through XII are intact. Muscle strength 5/5 in all extremities,  weaker in the lower extremities. Sensation intact. Gait not checked. Global weakness present. PSYCHIATRIC: The patient is alert and oriented x 3.  SKIN: No obvious rash, lesion, or ulcer.   LABORATORY PANEL:   CBC Recent Labs  Lab 09/17/17 1222  WBC 9.1  HGB 6.0*  HCT 19.4*  PLT 238   ------------------------------------------------------------------------------------------------------------------  Chemistries  Recent Labs  Lab 09/17/17 1222  NA 137  K 4.2  CL 108  CO2 18*  GLUCOSE 88  BUN 68*  CREATININE 9.33*  CALCIUM 7.3*    AST 18  ALT 7*  ALKPHOS 62  BILITOT 0.4   ------------------------------------------------------------------------------------------------------------------  Cardiac Enzymes Recent Labs  Lab 09/17/17 1222  TROPONINI 0.03*   ------------------------------------------------------------------------------------------------------------------  RADIOLOGY:  Dg Chest 2 View  Result Date: 09/17/2017 CLINICAL DATA:  Generalize weakness intermittently as well as dyspnea on exertion. EXAM: CHEST - 2 VIEW COMPARISON:  Chest x-ray of Nov 14, 2015 FINDINGS: The lungs are adequately inflated. There is an infiltrate in the right lower lobe which is new. There is no pleural effusion. The cardiac silhouette is mildly enlarged. The pulmonary vascularity is not engorged. There is mild multilevel degenerative disc disease of the thoracic spine. IMPRESSION: Right lower lobe pneumonia. Underlying chronic bronchitic changes. No pulmonary edema. Followup PA and lateral chest X-ray is recommended in 3-4 weeks following trial of antibiotic therapy to ensure resolution and exclude underlying malignancy. Electronically Signed   By: David  Martinique M.D.   On: 09/17/2017 12:50    EKG:   Orders placed or performed during the hospital encounter of 09/17/17  . ED EKG  . ED EKG  . EKG 12-Lead  . EKG 12-Lead    IMPRESSION AND PLAN:   [name] Holmes  is a 83 y.o. female with a known history of CK D, anemia of chronic disease, hypertension, alcohol abuse presents to hospital secondary to extreme weakness and noted to be in acute renal failure.  1. ARF- on CKD stage IV, baseline creatinine around 2.6. -Likely worsened from recent GI losses. Nephrology consulted. -Gentle hydration, intake and output monitor next in half and recheck creatinine in a.m. -No acute indication for dialysis at this time -Treat underlying UTI, avoid nephrotoxins and monitor  2. Acute on chronic anemia of chronic disease-likely from underlying  kidney disease. -GI workup was negative about a year ago. -Anemia panel with normal iron levels. Discontinue iron supplements -receiving 1 unit packed RBC transfusion to keep hemoglobin greater than 7. Start Procrit again because she had good response to Procrit in the past. She'll need to be maintained on Procrit every 2 weeks  3. Elevated troponin- likely demand ischemia secondary to her anemia and renal failure. -No active cardiac symptoms. Recycle troponins  4. Alcohol abuse-significant alcohol abuse. However hasn't had anything to drink in the past week according to patient.  5. Hypertension-continue Norvasc and metoprolol  6. Acute cystitis-follow-up urine cultures. On Rocephin  7. DVT prophylaxis Subcutaneous Heparin   All the records are reviewed and case discussed with ED provider. Management plans discussed with the patient, family and they are in agreement.  CODE STATUS: full code  TOTAL TIME TAKING CARE OF THIS PATIENT: 50 minutes.  [name] Holmes M.D on 09/17/2017 at 4:58 PM  Between 7am to 6pm - Pager - [phone]  After 6pm go to [email or web address] - password [email or web address] [facility]  Office  [phone]  CC: Primary care physician; Marguerita Merles, MD

## 2017-09-17 NOTE — Progress Notes (Signed)
Pharmacy Antibiotic Note  [name] Holmes is a 83 y.o. female admitted on 09/17/2017 with CAP.  Pharmacy has been consulted for ceftriaxone dosing.  Plan: The order from EDP for levofloxacin has been discontinued due to drug-drug interaction with azithromycin. Will also start ceftriaxone 1 gm IV Q24H for CAP.  Height: 5\' 4"  (162.6 cm) Weight: 150 lb (68 kg) IBW/kg (Calculated) : 54.7  Temp (24hrs), Avg:98 F (36.7 C), Min:98 F (36.7 C), Max:98 F (36.7 C)  Recent Labs  Lab 09/17/17 1222  WBC 9.1  CREATININE 9.33*    Estimated Creatinine Clearance: 4.6 mL/min (A) (by C-G formula based on SCr of 9.33 mg/dL (H)).    No Known Allergies  Antimicrobials this admission:  Dose adjustments this admission: CrCl less than 10 mL/min - ceftriaxone does not require renal adjustment. Will continue to follow.  Microbiology results:  BCx:   UCx:    Sputum:    MRSA PCR:   Thank you for allowing pharmacy to be a part of this patient's care.  Laural Benes, Pharm.D., BCPS Clinical Pharmacist 09/17/2017 2:59 PM

## 2017-09-17 NOTE — ED Provider Notes (Signed)
[facility] Emergency Department Provider Note       Time seen: ----------------------------------------- 12:14 PM on 09/17/2017 -----------------------------------------   I have reviewed the triage vital signs and the nursing notes.  HISTORY   Chief Complaint Weakness    HPI [name] Holmes is a 83 y.o. female with a history of anemia due to chronic renal failure, arthritis, chronic kidney disease, alcohol abuse, gout and hypertension who presents to the ED for generalized weakness that comes and goes that began last night.  She is also complaining of shortness of breath with exertion.  She denies fevers, chills, chest pain, blood in her stool or other complaints.  She is not currently on dialysis.  Past Medical History:  Diagnosis Date  . Anemia of chronic renal failure   . Arthritis   . CKD (chronic kidney disease)   . ETOH abuse   . Gout   . Hypertension     Patient Active Problem List   Diagnosis Date Noted  . Chest pain 11/14/2016  . Alcoholism ([place])   . GIB (gastrointestinal bleeding) 10/14/2015  . Melena 08/24/2015  . Anemia of chronic kidney failure 04/26/2015  . Pressure ulcer 12/06/2014  . Weakness 12/05/2014  . H/O ETOH abuse 12/05/2014  . HTN (hypertension) 12/05/2014  . Gout 12/05/2014  . Acute on chronic kidney failure ([place]) 11/03/2014  . UTI (lower urinary tract infection) 11/03/2014  . Anemia     Past Surgical History:  Procedure Laterality Date  . ABDOMINAL HYSTERECTOMY    . COLONOSCOPY WITH PROPOFOL N/A 12/08/2014   Procedure: COLONOSCOPY WITH PROPOFOL;  Surgeon: Josefine Class, MD;  Location: [facility] ENDOSCOPY;  Service: Endoscopy;  Laterality: N/A;  . ESOPHAGOGASTRODUODENOSCOPY (EGD) WITH PROPOFOL N/A 11/08/2014   Procedure: ESOPHAGOGASTRODUODENOSCOPY (EGD) WITH PROPOFOL;  Surgeon: Lollie Sails, MD;  Location: [facility] ENDOSCOPY;  Service: Endoscopy;  Laterality: N/A;  . GIVENS CAPSULE STUDY N/A 08/26/2015   Procedure: GIVENS CAPSULE STUDY;  Surgeon: Josefine Class, MD;  Location: [facility] ENDOSCOPY;  Service: Endoscopy;  Laterality: N/A;    Allergies Patient has no known allergies.  Social History Social History   Tobacco Use  . Smoking status: Former Smoker    Types: Cigarettes  . Smokeless tobacco: Never Used  Substance Use Topics  . Alcohol use: Yes    Alcohol/week: 7.2 oz    Types: 6 Cans of beer, 6 Shots of liquor per week    Comment: daily  . Drug use: No   Review of Systems Constitutional: Negative for fever. Cardiovascular: Negative for chest pain. Respiratory: Positive for shortness of breath Gastrointestinal: Negative for abdominal pain, vomiting and diarrhea. Musculoskeletal: For extremity pain Skin: Negative for rash. Neurological: Negative for headaches, positive for weakness  All systems negative/normal/unremarkable except as stated in the HPI  ____________________________________________   PHYSICAL EXAM:  VITAL SIGNS: ED Triage Vitals  Enc Vitals Group     BP      Pulse      Resp      Temp      Temp src      SpO2      Weight      Height      Head Circumference      Peak Flow      Pain Score      Pain Loc      Pain Edu?      Excl. in [place]?    Constitutional: Alert and oriented. Well appearing and in no distress. Eyes: Conjunctivae are  pale. Normal extraocular movements. ENT   Head: Normocephalic and atraumatic.   Nose: No congestion/rhinnorhea.   Mouth/Throat: Mucous membranes are moist.   Neck: No stridor. Cardiovascular: Normal rate, regular rhythm. No murmurs, rubs, or gallops. Respiratory: Normal respiratory effort without tachypnea nor retractions. Breath sounds are clear and equal bilaterally. No wheezes/rales/rhonchi. Gastrointestinal: Soft and nontender. Normal bowel sounds.  Heme-negative stool, no hemorrhoids Musculoskeletal: Bilateral knee effusions are noted with limited range of motion Neurologic:  Normal speech and  language. No gross focal neurologic deficits are appreciated.  Generalized weakness Skin:  Skin is warm, dry and intact.  Pallor is noted Psychiatric: Mood and affect are normal. Speech and behavior are normal.  ____________________________________________  EKG: Interpreted by me.  Sinus rhythm the rate of 65 bpm, baseline artifact is noted, no obvious ST elevations are noted, leftward axis, long QT  ____________________________________________  ED COURSE:  As part of my medical decision making, I reviewed the following data within the [place] History obtained from family if available, nursing notes, old chart and ekg, as well as notes from prior ED visits. Patient presented for weakness and dyspnea, we will assess with labs and imaging as indicated at this time. Clinical Course as of Sep 18 1303  Wed Sep 17, 2017  1249 Hemoglobin: (!) 6.0 [JW]    Clinical Course User Index [JW] Earleen Newport, MD   Procedures ____________________________________________   LABS (pertinent positives/negatives)  Labs Reviewed  CBC WITH DIFFERENTIAL/PLATELET - Abnormal; Notable for the following components:      Result Value   RBC 2.26 (*)    Hemoglobin 6.0 (*)    HCT 19.4 (*)    MCHC 30.9 (*)    RDW 14.8 (*)    All other components within normal limits  COMPREHENSIVE METABOLIC PANEL - Abnormal; Notable for the following components:   CO2 18 (*)    BUN 68 (*)    Creatinine, Ser 9.33 (*)    Calcium 7.3 (*)    Albumin 2.5 (*)    ALT 7 (*)    GFR calc non Af Amer 3 (*)    GFR calc Af Amer 4 (*)    All other components within normal limits  TROPONIN I - Abnormal; Notable for the following components:   Troponin I 0.03 (*)    All other components within normal limits  URINALYSIS, COMPLETE (UACMP) WITH MICROSCOPIC  URIC ACID  CBG MONITORING, ED  PREPARE RBC (CROSSMATCH)  TYPE AND SCREEN   CRITICAL CARE Performed by: Laurence Aly   Total critical care  time: 30 minutes  Critical care time was exclusive of separately billable procedures and treating other patients.  Critical care was necessary to treat or prevent imminent or life-threatening deterioration.  Critical care was time spent personally by me on the following activities: development of treatment plan with patient and/or surrogate as well as nursing, discussions with consultants, evaluation of patient's response to treatment, examination of patient, obtaining history from patient or surrogate, ordering and performing treatments and interventions, ordering and review of laboratory studies, ordering and review of radiographic studies, pulse oximetry and re-evaluation of patient's condition.  RADIOLOGY CXR IMPRESSION: Right lower lobe pneumonia. Underlying chronic bronchitic changes. No pulmonary edema. Followup PA and lateral chest X-ray is recommended in 3-4 weeks following trial of antibiotic therapy to ensure resolution and exclude underlying malignancy.  ____________________________________________  DIFFERENTIAL DIAGNOSIS   Renal failure, anemia, dehydration, electrolyte abnormality  FINAL ASSESSMENT AND PLAN  Weakness, dyspnea,  acute renal failure, anemia, right lower lobe pneumonia   Plan: The patient had presented for weakness and dyspnea. Patient's labs revealed significant changes in her baseline creatinine level and subsequent anemia likely secondary to same.  She was heme negative rectally. Patient's imaging revealed a possible right lower lobe pneumonia as well in addition to the above findings.  I have ordered a blood transfusion for her.  I will discussed with nephrology and also order IV Levaquin for pneumonia.   Laurence Aly, MD   Note: This note was generated in part or whole with voice recognition software. Voice recognition is usually quite accurate but there are transcription errors that can and very often do occur. I apologize for any  typographical errors that were not detected and corrected.     Earleen Newport, MD 09/17/17 [phone]

## 2017-09-17 NOTE — Progress Notes (Signed)
   [place] at [facility] Day: 0 days [name] Holmes is a 83 y.o. female presenting with Weakness .   Advance care planning discussed with patient at bedside. All questions in regards to overall condition and expected prognosis answered. The decision was made to continue current code status. Patient made clear that she wants everything to be done in case of a cardiac arrest.  CODE STATUS: Full Code Time spent: 18 minutes

## 2017-09-17 NOTE — ED Notes (Signed)
Date and time results received: 09/17/17 1253   Test: troponin Critical Value: 0.03  Name of Provider Notified: Dr. Jimmye Norman

## 2017-09-18 ENCOUNTER — Inpatient Hospital Stay: Payer: Medicare Other

## 2017-09-18 LAB — BASIC METABOLIC PANEL
ANION GAP: 10 (ref 5–15)
BUN: 69 mg/dL — AB (ref 6–20)
CO2: 18 mmol/L — ABNORMAL LOW (ref 22–32)
Calcium: 7.1 mg/dL — ABNORMAL LOW (ref 8.9–10.3)
Chloride: 114 mmol/L — ABNORMAL HIGH (ref 101–111)
Creatinine, Ser: 9.03 mg/dL — ABNORMAL HIGH (ref 0.44–1.00)
GFR calc Af Amer: 4 mL/min — ABNORMAL LOW (ref >60)
GFR, EST NON AFRICAN AMERICAN: 4 mL/min — AB (ref >60)
Glucose, Bld: 75 mg/dL (ref 65–99)
POTASSIUM: 4.3 mmol/L (ref 3.5–5.1)
SODIUM: 142 mmol/L (ref 135–145)

## 2017-09-18 LAB — CBC
HEMATOCRIT: 19.2 % — AB (ref 35.0–47.0)
HEMOGLOBIN: 6.2 g/dL — AB (ref 12.0–16.0)
MCH: 27.5 pg (ref 26.0–34.0)
MCHC: 32.5 g/dL (ref 32.0–36.0)
MCV: 84.5 fL (ref 80.0–100.0)
Platelets: 195 10*3/uL (ref 150–440)
RBC: 2.27 MIL/uL — ABNORMAL LOW (ref 3.80–5.20)
RDW: 15.5 % — AB (ref 11.5–14.5)
WBC: 9.3 10*3/uL (ref 3.6–11.0)

## 2017-09-18 LAB — TROPONIN I: TROPONIN I: 0.04 ng/mL — AB (ref <0.03)

## 2017-09-18 LAB — GLUCOSE, CAPILLARY
GLUCOSE-CAPILLARY: 79 mg/dL (ref 65–99)
GLUCOSE-CAPILLARY: 111 mg/dL — AB (ref 65–99)

## 2017-09-18 LAB — PREPARE RBC (CROSSMATCH)

## 2017-09-18 MED ORDER — SODIUM CHLORIDE 0.9 % IV SOLN
Freq: Once | INTRAVENOUS | Status: AC
  Administered 2017-09-18: 18:00:00 via INTRAVENOUS

## 2017-09-18 MED ORDER — AZITHROMYCIN 500 MG PO TABS
500.0000 mg | ORAL_TABLET | Freq: Every day | ORAL | Status: DC
  Administered 2017-09-18 – 2017-09-20 (×3): 500 mg via ORAL
  Filled 2017-09-18 (×3): qty 1

## 2017-09-18 MED ORDER — DIPHENHYDRAMINE HCL 50 MG/ML IJ SOLN
25.0000 mg | Freq: Once | INTRAMUSCULAR | Status: AC
  Administered 2017-09-18: 25 mg via INTRAVENOUS
  Filled 2017-09-18: qty 0.5

## 2017-09-18 NOTE — Care Management (Signed)
Patient has been followed by [facility] in the past RN PT Aide and SW.  Was followed by [facility] initially for medication box supervision on a monthly basis.  Nephrology is following and at present, no need for dialysis at present.  Has required transfusion for anemia which was also present during her May 2018 admission.  Have requested PT consult

## 2017-09-18 NOTE — Progress Notes (Signed)
[facility] - [place], [place] 09/18/17  Subjective:   Patient known to our practice from previous admissions and outpatient follow-up She has known chronic kidney disease She presents this time for generalized weakness She also reports nausea and vomiting prior to admission  Baseline creatinine is 2.50, GFR 20 from May 2018 Admission creatinine 9.33, GFR 4. Today, slight improvement to Cr of 9.34  Clinically patient appears to have improved  More alert today and reports eating breakfast  Objective:  Vital signs in last 24 hours:  Temp:  [97.6 F (36.4 C)-99.3 F (37.4 C)] 98.8 F (37.1 C) (03/21 1146) Pulse Rate:  [63-79] 73 (03/21 1146) Resp:  [15-22] 18 (03/21 1146) BP: (133-184)/(64-97) 148/66 (03/21 1146) SpO2:  [93 %-100 %] 93 % (03/21 1146) Weight:  [60.4 kg (133 lb 1.6 oz)-68 kg (150 lb)] 60.4 kg (133 lb 1.6 oz) (03/20 1551)  Weight change:  Filed Weights   09/17/17 1220 09/17/17 1551  Weight: 68 kg (150 lb) 60.4 kg (133 lb 1.6 oz)    Intake/Output:    Intake/Output Summary (Last 24 hours) at 09/18/2017 1155 Last data filed at 09/18/2017 0422 Gross per 24 hour  Intake 1375 ml  Output 2 ml  Net 1373 ml     Physical Exam: General:  No acute distress, laying in the bed  HEENT  anicteric, moist oral mucous membranes  Neck  supple  Pulm/lungs  normal breathing effort  CVS/Heart  irregular rhythm  Abdomen:   Soft, nontender  Extremities:  + peripheral edema  Neurologic:  Alert, oriented to self  Skin:  No acute rashes          Basic Metabolic Panel:  Recent Labs  Lab 09/17/17 1222 09/18/17 0350  NA 137 142  K 4.2 4.3  CL 108 114*  CO2 18* 18*  GLUCOSE 88 75  BUN 68* 69*  CREATININE 9.33* 9.03*  CALCIUM 7.3* 7.1*     CBC: Recent Labs  Lab 09/17/17 1222 09/17/17 2039 09/18/17 0350  WBC 9.1  --  9.3  NEUTROABS 6.5  --   --   HGB 6.0* 7.1* 6.2*  HCT 19.4* 21.9* 19.2*  MCV 85.7  --  84.5  PLT 238  --  195     No  results found for: HEPBSAG, HEPBSAB, HEPBIGM    Microbiology:  No results found for this or any previous visit (from the past 240 hour(s)).  Coagulation Studies: No results for input(s): LABPROT, INR in the last 72 hours.  Urinalysis: Recent Labs    09/17/17 1222  COLORURINE YELLOW*  LABSPEC 1.005  PHURINE 5.0  GLUCOSEU NEGATIVE  HGBUR SMALL*  BILIRUBINUR NEGATIVE  KETONESUR NEGATIVE  PROTEINUR 100*  NITRITE NEGATIVE  LEUKOCYTESUR LARGE*      Imaging: Dg Chest 2 View  Result Date: 09/17/2017 CLINICAL DATA:  Generalize weakness intermittently as well as dyspnea on exertion. EXAM: CHEST - 2 VIEW COMPARISON:  Chest x-ray of Nov 14, 2015 FINDINGS: The lungs are adequately inflated. There is an infiltrate in the right lower lobe which is new. There is no pleural effusion. The cardiac silhouette is mildly enlarged. The pulmonary vascularity is not engorged. There is mild multilevel degenerative disc disease of the thoracic spine. IMPRESSION: Right lower lobe pneumonia. Underlying chronic bronchitic changes. No pulmonary edema. Followup PA and lateral chest X-ray is recommended in 3-4 weeks following trial of antibiotic therapy to ensure resolution and exclude underlying malignancy. Electronically Signed   By: David  Martinique M.D.   On:  09/17/2017 12:50     Medications:   . sodium chloride 75 mL/hr at 09/18/17 0254  . cefTRIAXone (ROCEPHIN)  IV 1 g (09/17/17 1526)   . amLODipine  10 mg Oral Daily  . azithromycin  500 mg Oral q1800  . calcium-vitamin D  2 tablet Oral Daily  . cholecalciferol  1,000 Units Oral Daily  . febuxostat  40 mg Oral Daily  . feeding supplement (ENSURE ENLIVE)  237 mL Oral BID BM  . folic acid  1 mg Oral Daily  . heparin  5,000 Units Subcutaneous Q8H  . levothyroxine  50 mcg Oral QAC breakfast  . magnesium oxide  400 mg Oral Daily  . metoprolol tartrate  50 mg Oral BID  . mirtazapine  15 mg Oral QHS  . multivitamin with minerals  1 tablet Oral Daily   . oxybutynin  5 mg Oral QHS  . pantoprazole  40 mg Oral Daily  . thiamine  100 mg Oral Daily  . traZODone  50 mg Oral QHS   acetaminophen, ondansetron **OR** ondansetron (ZOFRAN) IV  Assessment/ Plan:  83 y.o. African-American female chronic kidney disease, history of alcohol abuse, gout, hypertension, anemia  presents for generalized weakness, nausea, poor appetite  1.  Acute renal failure on chronic kidney disease stage IV Baseline creatinine 2.50, GFR 20 from May 2018 2.  Urinary tract infection 3.  Hypertension 4.  Anemia, of chronic kidney disease  Patient may have developed acute renal failure/ATN from underlying concurrent infection Renal ultrasound is pending gentle IV hydration, treatment of UTI Given SQ EPO x 1 on 3/20 Observe over the next 1-2 days To see if there is any improvement Will need to discuss with family regarding goals of care Patient did express that if necessary, she would be willing to take dialysis Her sister and her son were on dialysis, so she is familiar with the process     LOS: 1 Arav Bannister Candiss Norse 3/21/201911:55 AM  [place], [place]  Note: This note was prepared with Dragon dictation. Any transcription errors are unintentional

## 2017-09-18 NOTE — Progress Notes (Signed)
Contacted hospitalist via phone call r/t lab values: hgb 6.2, hct 19.2. Troponin 0.04 No new orders at this time.

## 2017-09-18 NOTE — Progress Notes (Signed)
Contacted hospitalist via text page r/t lab values: hgb 7.1, hct 21.9. No new orders.

## 2017-09-18 NOTE — Plan of Care (Signed)
  Problem: Education: Goal: Knowledge of General Education information will improve Outcome: Progressing   Problem: Health Behavior/Discharge Planning: Goal: Ability to manage health-related needs will improve Outcome: Progressing   Problem: Clinical Measurements: Goal: Will remain free from infection Outcome: Progressing Goal: Respiratory complications will improve Outcome: Progressing Goal: Cardiovascular complication will be avoided Outcome: Progressing   Problem: Coping: Goal: Level of anxiety will decrease Outcome: Progressing   Problem: Elimination: Goal: Will not experience complications related to bowel motility Outcome: Progressing Goal: Will not experience complications related to urinary retention Outcome: Progressing   Problem: Pain Managment: Goal: General experience of comfort will improve Outcome: Progressing   Problem: Safety: Goal: Ability to remain free from injury will improve Outcome: Progressing   Problem: Skin Integrity: Goal: Risk for impaired skin integrity will decrease Outcome: Progressing   Problem: Clinical Measurements: Goal: Ability to maintain clinical measurements within normal limits will improve Outcome: Not Progressing Goal: Diagnostic test results will improve Outcome: Not Progressing

## 2017-09-18 NOTE — Progress Notes (Signed)
PHARMACIST - PHYSICIAN COMMUNICATION DR:   Anselm Jungling CONCERNING: Antibiotic IV to Oral Route Change Policy  RECOMMENDATION: This patient is receiving azithromycin by the intravenous route.  Based on criteria approved by the Pharmacy and Therapeutics Committee, the antibiotic(s) is/are being converted to the equivalent oral dose form(s).   DESCRIPTION: These criteria include:  Patient being treated for a respiratory tract infection, urinary tract infection, cellulitis or clostridium difficile associated diarrhea if on metronidazole  The patient is not neutropenic and does not exhibit a GI malabsorption state  The patient is eating (either orally or via tube) and/or has been taking other orally administered medications for a least 24 hours  The patient is improving clinically and has a Tmax < 100.5  If you have questions about this conversion, please contact the [place], PharmD 09/18/17 11:06 AM

## 2017-09-19 LAB — CBC
HCT: 27.3 % — ABNORMAL LOW (ref 35.0–47.0)
HEMOGLOBIN: 8.9 g/dL — AB (ref 12.0–16.0)
MCH: 28.1 pg (ref 26.0–34.0)
MCHC: 32.6 g/dL (ref 32.0–36.0)
MCV: 86.1 fL (ref 80.0–100.0)
Platelets: 205 10*3/uL (ref 150–440)
RBC: 3.17 MIL/uL — AB (ref 3.80–5.20)
RDW: 15.1 % — ABNORMAL HIGH (ref 11.5–14.5)
WBC: 10.6 10*3/uL (ref 3.6–11.0)

## 2017-09-19 LAB — BASIC METABOLIC PANEL
Anion gap: 9 (ref 5–15)
BUN: 63 mg/dL — ABNORMAL HIGH (ref 6–20)
CALCIUM: 6.9 mg/dL — AB (ref 8.9–10.3)
CHLORIDE: 109 mmol/L (ref 101–111)
CO2: 17 mmol/L — AB (ref 22–32)
CREATININE: 8.39 mg/dL — AB (ref 0.44–1.00)
GFR calc non Af Amer: 4 mL/min — ABNORMAL LOW (ref >60)
GFR, EST AFRICAN AMERICAN: 5 mL/min — AB (ref >60)
Glucose, Bld: 96 mg/dL (ref 65–99)
Potassium: 4.4 mmol/L (ref 3.5–5.1)
SODIUM: 135 mmol/L (ref 135–145)

## 2017-09-19 LAB — TYPE AND SCREEN
ABO/RH(D): O POS
ANTIBODY SCREEN: NEGATIVE
UNIT DIVISION: 0
Unit division: 0

## 2017-09-19 LAB — BPAM RBC
BLOOD PRODUCT EXPIRATION DATE: 201904102359
Blood Product Expiration Date: 201904102359
ISSUE DATE / TIME: 201903201555
ISSUE DATE / TIME: 201903211817
Unit Type and Rh: 5100
Unit Type and Rh: 5100

## 2017-09-19 LAB — TROPONIN I: Troponin I: 0.03 ng/mL (ref <0.03)

## 2017-09-19 LAB — PHOSPHORUS: Phosphorus: 5.8 mg/dL — ABNORMAL HIGH (ref 2.5–4.6)

## 2017-09-19 MED ORDER — PROMETHAZINE HCL 25 MG/ML IJ SOLN
12.5000 mg | Freq: Four times a day (QID) | INTRAMUSCULAR | Status: DC | PRN
  Administered 2017-09-19 – 2017-09-21 (×5): 12.5 mg via INTRAVENOUS
  Filled 2017-09-19 (×7): qty 1

## 2017-09-19 NOTE — Progress Notes (Signed)
PT Cancellation Note  Patient Details Name: JA PISTOLE MRN: [id number] DOB: 1934/11/29   Cancelled Treatment:    Reason Eval/Treat Not Completed: Patient declined, no reason specified Pt laying in bed reporting that she is feeling very ill/nauseated.  Attempted to convince her to participate with limited acts but she refused any activity; "Maybe tomorrow if I'm feeling better."  Kreg Shropshire, DPT 09/19/2017, 3:00 PM

## 2017-09-19 NOTE — Progress Notes (Signed)
[facility], [place] 09/19/17  Subjective:   Patient known to our practice from previous admissions and outpatient follow-up She has known chronic kidney disease She presents this time for generalized weakness She also reports nausea and vomiting prior to admission  Baseline creatinine is 2.50, GFR 20 from May 2018 Admission creatinine 9.33, GFR 4. Today, slight improvement to Cr of 8.39  Very lethargic this morning Did not answer questions  Objective:  Vital signs in last 24 hours:  Temp:  [97.5 F (36.4 C)-98.9 F (37.2 C)] 97.5 F (36.4 C) (03/22 0553) Pulse Rate:  [59-78] 59 (03/22 0553) Resp:  [16-18] 16 (03/22 0553) BP: (136-157)/(66-86) 153/80 (03/22 0553) SpO2:  [91 %-100 %] 95 % (03/22 0553)  Weight change:  Filed Weights   09/17/17 1220 09/17/17 1551  Weight: 68 kg (150 lb) 60.4 kg (133 lb 1.6 oz)    Intake/Output:    Intake/Output Summary (Last 24 hours) at 09/19/2017 1048 Last data filed at 09/19/2017 0500 Gross per 24 hour  Intake 1481.25 ml  Output -  Net 1481.25 ml     Physical Exam: General:  No acute distress, laying in the bed  HEENT  anicteric, moist oral mucous membranes  Neck  supple  Pulm/lungs  normal breathing effort  CVS/Heart  irregular rhythm  Abdomen:   Soft, nontender  Extremities:  + peripheral edema  Neurologic:  Very sleepy, but arousable.  Did not wake answer questions  Skin:  No acute rashes          Basic Metabolic Panel:  Recent Labs  Lab 09/17/17 1222 09/18/17 0350 09/19/17 0421  NA 137 142 135  K 4.2 4.3 4.4  CL 108 114* 109  CO2 18* 18* 17*  GLUCOSE 88 75 96  BUN 68* 69* 63*  CREATININE 9.33* 9.03* 8.39*  CALCIUM 7.3* 7.1* 6.9*  PHOS  --   --  5.8*     CBC: Recent Labs  Lab 09/17/17 1222 09/17/17 2039 09/18/17 0350  WBC 9.1  --  9.3  NEUTROABS 6.5  --   --   HGB 6.0* 7.1* 6.2*  HCT 19.4* 21.9* 19.2*  MCV 85.7  --  84.5  PLT 238  --  195     No results found for:  HEPBSAG, HEPBSAB, HEPBIGM    Microbiology:  No results found for this or any previous visit (from the past 240 hour(s)).  Coagulation Studies: No results for input(s): LABPROT, INR in the last 72 hours.  Urinalysis: Recent Labs    09/17/17 1222  COLORURINE YELLOW*  LABSPEC 1.005  PHURINE 5.0  GLUCOSEU NEGATIVE  HGBUR SMALL*  BILIRUBINUR NEGATIVE  KETONESUR NEGATIVE  PROTEINUR 100*  NITRITE NEGATIVE  LEUKOCYTESUR LARGE*      Imaging: Dg Chest 2 View  Result Date: 09/17/2017 CLINICAL DATA:  Generalize weakness intermittently as well as dyspnea on exertion. EXAM: CHEST - 2 VIEW COMPARISON:  Chest x-ray of Nov 14, 2015 FINDINGS: The lungs are adequately inflated. There is an infiltrate in the right lower lobe which is new. There is no pleural effusion. The cardiac silhouette is mildly enlarged. The pulmonary vascularity is not engorged. There is mild multilevel degenerative disc disease of the thoracic spine. IMPRESSION: Right lower lobe pneumonia. Underlying chronic bronchitic changes. No pulmonary edema. Followup PA and lateral chest X-ray is recommended in 3-4 weeks following trial of antibiotic therapy to ensure resolution and exclude underlying malignancy. Electronically Signed   By: David  Martinique M.D.   On: 09/17/2017  12:50   US Renal  Result Date: 09/18/2017 CLINICAL DATA:  83 year old female with acute renal failure. EXAM: RENAL / URINARY TRACT ULTRASOUND COMPLETE COMPARISON:  Abdomen ultrasound 11/16/2015. CT Abdomen and Pelvis 08/09/2014. FINDINGS: Right Kidney: Length: Length estimated at 9.8 centimeters. Chronically echogenic right renal cortex, progressed since 2017 (image 4). No hydronephrosis. Several small simple appearing right renal cysts are noted, up to 1.5 centimeters diameter (image 13). Left Kidney: Length: Estimated at 8.4 centimeters. Chronic increased left renal echogenicity with progression since 2017 (image 20). A left lower pole region cyst with 1 or 2 thin  septations has increased in size since 2016, now 3.8 cm diameter (previously 2 cm) but appears benign with no septum thickening or vascular elements (image 30). Bladder: Appears normal for degree of bladder distention. IMPRESSION: 1. No acute renal findings. Chronic medical renal disease with ultrasound evidence of progression since 2017. 2. Benign bilateral renal cysts. The largest at the left lower pole has enlarged since 2017 but has no suspicious features. Electronically Signed   By: Genevie Ann M.D.   On: 09/18/2017 12:02     Medications:   . sodium chloride 30 mL/hr at 09/19/17 0636  . cefTRIAXone (ROCEPHIN)  IV Stopped (09/18/17 1643)   . amLODipine  10 mg Oral Daily  . azithromycin  500 mg Oral q1800  . calcium-vitamin D  2 tablet Oral Daily  . cholecalciferol  1,000 Units Oral Daily  . feeding supplement (ENSURE ENLIVE)  237 mL Oral BID BM  . folic acid  1 mg Oral Daily  . heparin  5,000 Units Subcutaneous Q8H  . levothyroxine  50 mcg Oral QAC breakfast  . magnesium oxide  400 mg Oral Daily  . metoprolol tartrate  50 mg Oral BID  . mirtazapine  15 mg Oral QHS  . multivitamin with minerals  1 tablet Oral Daily  . pantoprazole  40 mg Oral Daily  . thiamine  100 mg Oral Daily  . traZODone  50 mg Oral QHS   acetaminophen, ondansetron **OR** ondansetron (ZOFRAN) IV  Assessment/ Plan:  83 y.o. African-American female chronic kidney disease, history of alcohol abuse, gout, hypertension, anemia  presents for generalized weakness, nausea, poor appetite  1.  Acute renal failure on chronic kidney disease stage IV Baseline creatinine 2.50, GFR 20 from May 2018 2.  Urinary tract infection 3.  Hypertension 4.  Anemia, of chronic kidney disease  Patient may have developed acute renal failure/ATN from underlying concurrent infection Continue gentle IV hydration Electrolytes and volume status are acceptable No acute indication for dialysis at present     LOS: 2 [name] Holmes 3/22/201910:48 AM  [place], [place]  Note: This note was prepared with Dragon dictation. Any transcription errors are unintentional

## 2017-09-19 NOTE — Progress Notes (Signed)
Palliative Medicine consult noted. Due to high referral volume, there may be a delay seeing this patient. Please call the Palliative Medicine Team office at [phone] if recommendations are needed in the interim.  Thank you for inviting [place] to see this patient.  Marjie Skiff Ruba Outen, RN, BSN, [facility] Palliative Medicine Team 09/19/2017 3:12 PM Office [phone]

## 2017-09-19 NOTE — Progress Notes (Signed)
[facility] [place] NAME: [name] Holmes    MR#:  [id number]  DATE OF BIRTH:  11/16/1934  SUBJECTIVE:  CHIEF COMPLAINT:   Chief Complaint  Patient presents with  . Weakness    Noted to have acute on Ch renal failure, UTI and Pneumonia. Renal func some better, still very weak.  REVIEW OF SYSTEMS:  CONSTITUTIONAL: No fever,positive for fatigue or weakness.  EYES: No blurred or double vision.  EARS, NOSE, AND THROAT: No tinnitus or ear pain.  RESPIRATORY: No cough, shortness of breath, wheezing or hemoptysis.  CARDIOVASCULAR: No chest pain, orthopnea, edema.  GASTROINTESTINAL: No nausea, vomiting, diarrhea or abdominal pain.  GENITOURINARY: No dysuria, hematuria.  ENDOCRINE: No polyuria, nocturia,  HEMATOLOGY: No anemia, easy bruising or bleeding SKIN: No rash or lesion. MUSCULOSKELETAL: No joint pain or arthritis.   NEUROLOGIC: No tingling, numbness, weakness.  PSYCHIATRY: No anxiety or depression.   ROS  DRUG ALLERGIES:  No Known Allergies  VITALS:  Blood pressure (!) 153/80, pulse (!) 59, temperature (!) 97.5 F (36.4 C), temperature source Oral, resp. rate 16, height 5\' 4"  (1.626 m), weight 60.4 kg (133 lb 1.6 oz), SpO2 95 %.  PHYSICAL EXAMINATION:  GENERAL:  83 y.o.-year-old patient lying in the bed with no acute distress.  EYES: Pupils equal, round, reactive to light and accommodation. No scleral icterus. Extraocular muscles intact.  HEENT: Head atraumatic, normocephalic. Oropharynx and nasopharynx clear.  NECK:  Supple, no jugular venous distention. No thyroid enlargement, no tenderness.  LUNGS: Normal breath sounds bilaterally, no wheezing, some crepitation. No use of accessory muscles of respiration.  CARDIOVASCULAR: S1, S2 normal. No murmurs, rubs, or gallops.  ABDOMEN: Soft, nontender, nondistended. Bowel sounds present. No organomegaly or mass.  EXTREMITIES: No pedal edema, cyanosis, or clubbing.  NEUROLOGIC: Cranial  nerves II through XII are intact. Muscle strength 4/5 in all extremities. Sensation intact. Gait not checked.  PSYCHIATRIC: The patient is alert and oriented x 2.  SKIN: No obvious rash, lesion, or ulcer.   Physical Exam LABORATORY PANEL:   CBC Recent Labs  Lab 09/18/17 0350  WBC 9.3  HGB 6.2*  HCT 19.2*  PLT 195   ------------------------------------------------------------------------------------------------------------------  Chemistries  Recent Labs  Lab 09/17/17 1222  09/19/17 0421  NA 137   < > 135  K 4.2   < > 4.4  CL 108   < > 109  CO2 18*   < > 17*  GLUCOSE 88   < > 96  BUN 68*   < > 63*  CREATININE 9.33*   < > 8.39*  CALCIUM 7.3*   < > 6.9*  AST 18  --   --   ALT 7*  --   --   ALKPHOS 62  --   --   BILITOT 0.4  --   --    < > = values in this interval not displayed.   ------------------------------------------------------------------------------------------------------------------  Cardiac Enzymes Recent Labs  Lab 09/18/17 0350 09/18/17 2338  TROPONINI 0.04* 0.03*   ------------------------------------------------------------------------------------------------------------------  RADIOLOGY:  Dg Chest 2 View  Result Date: 09/17/2017 CLINICAL DATA:  Generalize weakness intermittently as well as dyspnea on exertion. EXAM: CHEST - 2 VIEW COMPARISON:  Chest x-ray of Nov 14, 2015 FINDINGS: The lungs are adequately inflated. There is an infiltrate in the right lower lobe which is new. There is no pleural effusion. The cardiac silhouette is mildly enlarged. The pulmonary vascularity is not engorged. There is mild multilevel degenerative disc disease  of the thoracic spine. IMPRESSION: Right lower lobe pneumonia. Underlying chronic bronchitic changes. No pulmonary edema. Followup PA and lateral chest X-ray is recommended in 3-4 weeks following trial of antibiotic therapy to ensure resolution and exclude underlying malignancy. Electronically Signed   By: David   Martinique M.D.   On: 09/17/2017 12:50   US Renal  Result Date: 09/18/2017 CLINICAL DATA:  83 year old female with acute renal failure. EXAM: RENAL / URINARY TRACT ULTRASOUND COMPLETE COMPARISON:  Abdomen ultrasound 11/16/2015. CT Abdomen and Pelvis 08/09/2014. FINDINGS: Right Kidney: Length: Length estimated at 9.8 centimeters. Chronically echogenic right renal cortex, progressed since 2017 (image 4). No hydronephrosis. Several small simple appearing right renal cysts are noted, up to 1.5 centimeters diameter (image 13). Left Kidney: Length: Estimated at 8.4 centimeters. Chronic increased left renal echogenicity with progression since 2017 (image 20). A left lower pole region cyst with 1 or 2 thin septations has increased in size since 2016, now 3.8 cm diameter (previously 2 cm) but appears benign with no septum thickening or vascular elements (image 30). Bladder: Appears normal for degree of bladder distention. IMPRESSION: 1. No acute renal findings. Chronic medical renal disease with ultrasound evidence of progression since 2017. 2. Benign bilateral renal cysts. The largest at the left lower pole has enlarged since 2017 but has no suspicious features. Electronically Signed   By: Genevie Ann M.D.   On: 09/18/2017 12:02    ASSESSMENT AND PLAN:   Active Problems:   Acute renal failure (ARF) (HCC)   [name] Holmes  is a 83 y.o. female with a known history of CK D, anemia of chronic disease, hypertension, alcohol abuse presents to hospital secondary to extreme weakness and noted to be in acute renal failure.  1. ARF- on CKD stage IV, baseline creatinine around 2.6. -Likely worsened from recent GI losses. Dehydration, Nephrology consulted. -Gentle hydration, intake and output monitor ,recheck creatinine in a.m. Some improvement. -No acute indication for dialysis at this time -Treat underlying UTI, avoid nephrotoxins and monitor.  2. Acute on chronic anemia of chronic disease-likely from underlying kidney  disease. -GI workup was negative about a year ago. -Anemia panel with normal iron levels. Discontinue iron supplements -receiving 1 unit packed RBC transfusion to keep hemoglobin greater than 7. Start Procrit again because she had good response to Procrit in the past. She'll need to be maintained on Procrit every 2 weeks  Give one more unit PRBC today.  3. Elevated troponin- likely demand ischemia secondary to her anemia and renal failure. -No active cardiac symptoms. Recycle troponins  4. Alcohol abuse-significant alcohol abuse. However hasn't had anything to drink in the past week according to patient.  5. Hypertension-continue Norvasc and metoprolol  6. Acute cystitis-follow-up urine cultures. On Rocephin  7. DVT prophylaxis Subcutaneous Heparin  8. Pneumonia    Azithromycin + rocephin.  All the records are reviewed and case discussed with Care Management/Social Workerr. Management plans discussed with the patient, family and they are in agreement.  CODE STATUS: Full.  TOTAL TIME TAKING CARE OF THIS PATIENT: 35 minutes.     POSSIBLE D/C IN 1-2 DAYS, DEPENDING ON CLINICAL CONDITION.   Vaughan Basta M.D on 09/19/2017   Between 7am to 6pm - Pager - [phone]  After 6pm go to [email or web address] - password [email or web address] [facility]  Office  [phone]  CC: Primary care physician; Marguerita Merles, MD  Note: This dictation was prepared with Dragon dictation along with smaller phrase technology. Any transcriptional errors that result from  this process are unintentional.

## 2017-09-19 NOTE — Progress Notes (Signed)
Pharmacy Antibiotic Note  [name] Holmes is a 83 y.o. female admitted on 09/17/2017 with CAP.  Pharmacy has been consulted for ceftriaxone dosing.  This is day #3 of antibiotics. Patient is also receiving azithromycin 500 mg PO daily.  Plan: Continue ceftriaxone 1 g IV Daily  Height: 5\' 4"  (162.6 cm) Weight: 133 lb 1.6 oz (60.4 kg) IBW/kg (Calculated) : 54.7  Temp (24hrs), Avg:98.4 F (36.9 C), Min:97.5 F (36.4 C), Max:99.3 F (37.4 C)  Recent Labs  Lab 09/17/17 1222 09/18/17 0350 09/19/17 0421  WBC 9.1 9.3  --   CREATININE 9.33* 9.03* 8.39*    Estimated Creatinine Clearance: 4.6 mL/min (A) (by C-G formula based on SCr of 8.39 mg/dL (H)).    No Known Allergies  Antimicrobials this admission:  Dose adjustments this admission:  Microbiology results:  UCx:  Sent  Thank you for allowing pharmacy to be a part of this patient's care.  Lenis Noon, Pharm.D., BCPS Clinical Pharmacist 09/19/2017 10:05 AM

## 2017-09-19 NOTE — Progress Notes (Signed)
[facility] [place] NAME: [name] Holmes    MR#:  [id number]  DATE OF BIRTH:  Aug 22, 1934  SUBJECTIVE:  CHIEF COMPLAINT:   Chief Complaint  Patient presents with  . Weakness    Noted to have acute on Ch renal failure, UTI and Pneumonia. Renal func some better, still very weak.  REVIEW OF SYSTEMS:  CONSTITUTIONAL: No fever,positive for fatigue or weakness.  EYES: No blurred or double vision.  EARS, NOSE, AND THROAT: No tinnitus or ear pain.  RESPIRATORY: No cough, shortness of breath, wheezing or hemoptysis.  CARDIOVASCULAR: No chest pain, orthopnea, edema.  GASTROINTESTINAL: No nausea, vomiting, diarrhea or abdominal pain.  GENITOURINARY: No dysuria, hematuria.  ENDOCRINE: No polyuria, nocturia,  HEMATOLOGY: No anemia, easy bruising or bleeding SKIN: No rash or lesion. MUSCULOSKELETAL: No joint pain or arthritis.   NEUROLOGIC: No tingling, numbness, weakness.  PSYCHIATRY: No anxiety or depression.   ROS  DRUG ALLERGIES:  No Known Allergies  VITALS:  Blood pressure (!) 168/77, pulse 75, temperature (!) 97.4 F (36.3 C), temperature source Oral, resp. rate 18, height 5\' 4"  (1.626 m), weight 60.4 kg (133 lb 1.6 oz), SpO2 90 %.  PHYSICAL EXAMINATION:  GENERAL:  83 y.o.-year-old patient lying in the bed with no acute distress.  EYES: Pupils equal, round, reactive to light and accommodation. No scleral icterus. Extraocular muscles intact.  HEENT: Head atraumatic, normocephalic. Oropharynx and nasopharynx clear.  NECK:  Supple, no jugular venous distention. No thyroid enlargement, no tenderness.  LUNGS: Normal breath sounds bilaterally, no wheezing, some crepitation. No use of accessory muscles of respiration.  CARDIOVASCULAR: S1, S2 normal. No murmurs, rubs, or gallops.  ABDOMEN: Soft, nontender, nondistended. Bowel sounds present. No organomegaly or mass.  EXTREMITIES: No pedal edema, cyanosis, or clubbing.  NEUROLOGIC: Cranial nerves  II through XII are intact. Muscle strength 4/5 in all extremities. Sensation intact. Gait not checked.  PSYCHIATRIC: The patient is alert and oriented x 2.  SKIN: No obvious rash, lesion, or ulcer.   Physical Exam LABORATORY PANEL:   CBC Recent Labs  Lab 09/19/17 1521  WBC 10.6  HGB 8.9*  HCT 27.3*  PLT 205   ------------------------------------------------------------------------------------------------------------------  Chemistries  Recent Labs  Lab 09/17/17 1222  09/19/17 0421  NA 137   < > 135  K 4.2   < > 4.4  CL 108   < > 109  CO2 18*   < > 17*  GLUCOSE 88   < > 96  BUN 68*   < > 63*  CREATININE 9.33*   < > 8.39*  CALCIUM 7.3*   < > 6.9*  AST 18  --   --   ALT 7*  --   --   ALKPHOS 62  --   --   BILITOT 0.4  --   --    < > = values in this interval not displayed.   ------------------------------------------------------------------------------------------------------------------  Cardiac Enzymes Recent Labs  Lab 09/18/17 0350 09/18/17 2338  TROPONINI 0.04* 0.03*   ------------------------------------------------------------------------------------------------------------------  RADIOLOGY:  US Renal  Result Date: 09/18/2017 CLINICAL DATA:  83 year old female with acute renal failure. EXAM: RENAL / URINARY TRACT ULTRASOUND COMPLETE COMPARISON:  Abdomen ultrasound 11/16/2015. CT Abdomen and Pelvis 08/09/2014. FINDINGS: Right Kidney: Length: Length estimated at 9.8 centimeters. Chronically echogenic right renal cortex, progressed since 2017 (image 4). No hydronephrosis. Several small simple appearing right renal cysts are noted, up to 1.5 centimeters diameter (image 13). Left Kidney: Length: Estimated at 8.4 centimeters.  Chronic increased left renal echogenicity with progression since 2017 (image 20). A left lower pole region cyst with 1 or 2 thin septations has increased in size since 2016, now 3.8 cm diameter (previously 2 cm) but appears benign with no septum  thickening or vascular elements (image 30). Bladder: Appears normal for degree of bladder distention. IMPRESSION: 1. No acute renal findings. Chronic medical renal disease with ultrasound evidence of progression since 2017. 2. Benign bilateral renal cysts. The largest at the left lower pole has enlarged since 2017 but has no suspicious features. Electronically Signed   By: Genevie Ann M.D.   On: 09/18/2017 12:02    ASSESSMENT AND PLAN:   Active Problems:   Acute renal failure (ARF) (HCC)   [name] Holmes  is a 83 y.o. female with a known history of CK D, anemia of chronic disease, hypertension, alcohol abuse presents to hospital secondary to extreme weakness and noted to be in acute renal failure.  1. ARF- on CKD stage IV, baseline creatinine around 2.6. -Likely worsened from recent GI losses. Dehydration, Nephrology consulted. -Gentle hydration, intake and output monitor ,recheck creatinine in a.m. Some improvement. -No acute indication for dialysis at this time -Treat underlying UTI, avoid nephrotoxins and monitor. Slow improvement.  2. Acute on chronic anemia of chronic disease-likely from underlying kidney disease. -GI workup was negative about a year ago. -Anemia panel with normal iron levels. Discontinue iron supplements -receiving 1 unit packed RBC transfusion to keep hemoglobin greater than 7. Start Procrit again because she had good response to Procrit in the past. She'll need to be maintained on Procrit every 2 weeks  Given one more unit PRBC - Hb stable today.  3. Elevated troponin- likely demand ischemia secondary to her anemia and renal failure. -No active cardiac symptoms. Recycle troponins  4. Alcohol abuse-significant alcohol abuse. However hasn't had anything to drink in the past week according to patient.  5. Hypertension-continue Norvasc and metoprolol  6. Acute cystitis-follow-up urine cultures. On Rocephin  7. DVT prophylaxis Subcutaneous Heparin  8.  Pneumonia    Azithromycin + rocephin.  All the records are reviewed and case discussed with Care Management/Social Workerr. Management plans discussed with the patient, family and they are in agreement.  CODE STATUS: Full.  TOTAL TIME TAKING CARE OF THIS PATIENT: 35 minutes.     POSSIBLE D/C IN 2-3 DAYS, DEPENDING ON CLINICAL CONDITION.   Vaughan Basta M.D on 09/19/2017   Between 7am to 6pm - Pager - [phone]  After 6pm go to [email or web address] - password [email or web address] [facility]  Office  [phone]  CC: Primary care physician; Marguerita Merles, MD  Note: This dictation was prepared with Dragon dictation along with smaller phrase technology. Any transcriptional errors that result from this process are unintentional.

## 2017-09-19 NOTE — Progress Notes (Addendum)
Patient complains of nausea, is diaphoretic, light headed, felt "fluttery" in her chest, general malaise. Assessed patient, VS WNL, BG WNL, LOC WNL. Contacted hospitalist Jannifer Franklin, MD). See orders.   09/19/17 @0037 : continued to monitor patient. Patient sleeping and no complaints of nausea.

## 2017-09-20 LAB — CBC
HEMATOCRIT: 29.1 % — AB (ref 35.0–47.0)
Hemoglobin: 9.4 g/dL — ABNORMAL LOW (ref 12.0–16.0)
MCH: 27.9 pg (ref 26.0–34.0)
MCHC: 32.1 g/dL (ref 32.0–36.0)
MCV: 86.9 fL (ref 80.0–100.0)
Platelets: 218 10*3/uL (ref 150–440)
RBC: 3.35 MIL/uL — ABNORMAL LOW (ref 3.80–5.20)
RDW: 15.4 % — AB (ref 11.5–14.5)
WBC: 13.3 10*3/uL — ABNORMAL HIGH (ref 3.6–11.0)

## 2017-09-20 LAB — BASIC METABOLIC PANEL
Anion gap: 9 (ref 5–15)
BUN: 62 mg/dL — ABNORMAL HIGH (ref 6–20)
CALCIUM: 7.5 mg/dL — AB (ref 8.9–10.3)
CO2: 18 mmol/L — AB (ref 22–32)
CREATININE: 8.04 mg/dL — AB (ref 0.44–1.00)
Chloride: 106 mmol/L (ref 101–111)
GFR calc non Af Amer: 4 mL/min — ABNORMAL LOW (ref >60)
GFR, EST AFRICAN AMERICAN: 5 mL/min — AB (ref >60)
GLUCOSE: 158 mg/dL — AB (ref 65–99)
Potassium: 4.6 mmol/L (ref 3.5–5.1)
Sodium: 133 mmol/L — ABNORMAL LOW (ref 135–145)

## 2017-09-20 MED ORDER — NEPRO/CARBSTEADY PO LIQD: 237.0000 mL | Freq: Two times a day (BID) | ORAL | Status: DC

## 2017-09-20 MED ORDER — ALUM & MAG HYDROXIDE-SIMETH 200-200-20 MG/5ML PO SUSP: 30.0000 mL | ORAL | Status: DC | PRN

## 2017-09-20 NOTE — Progress Notes (Signed)
[facility], [place] 09/20/17  Subjective:   Patient known to our practice from previous admissions and outpatient follow-up She has known chronic kidney disease She presents this time for generalized weakness She also reports nausea and vomiting prior to admission  Baseline creatinine is 2.50, GFR 20 from May 2018 Admission creatinine 9.33, GFR 4. Today, slight improvement to Cr of 8.04  Very lethargic this morning Insert only very few questions Reports poor appetite and nausea Was able to eat a little bit  Objective:  Vital signs in last 24 hours:  Temp:  [97.4 F (36.3 C)-97.7 F (36.5 C)] 97.6 F (36.4 C) (03/23 0808) Pulse Rate:  [58-112] 58 (03/23 0808) Resp:  [16-18] 16 (03/23 0808) BP: (154-168)/(65-77) 160/69 (03/23 0808) SpO2:  [89 %-94 %] 89 % (03/23 0808)  Weight change:  Filed Weights   09/17/17 1220 09/17/17 1551  Weight: 150 lb (68 kg) 133 lb 1.6 oz (60.4 kg)    Intake/Output:    Intake/Output Summary (Last 24 hours) at 09/20/2017 1252 Last data filed at 09/20/2017 0500 Gross per 24 hour  Intake 300 ml  Output -  Net 300 ml     Physical Exam: General:  No acute distress, laying in the bed  HEENT  anicteric, moist oral mucous membranes  Neck  supple  Pulm/lungs  normal breathing effort  CVS/Heart  irregular rhythm  Abdomen:   Soft, nontender  Extremities:  + peripheral edema  Neurologic:  Very sleepy, but arousable.  Able to answer questions  Skin:  No acute rashes          Basic Metabolic Panel:  Recent Labs  Lab 09/17/17 1222 09/18/17 0350 09/19/17 0421 09/20/17 0425  NA 137 142 135 133*  K 4.2 4.3 4.4 4.6  CL 108 114* 109 106  CO2 18* 18* 17* 18*  GLUCOSE 88 75 96 158*  BUN 68* 69* 63* 62*  CREATININE 9.33* 9.03* 8.39* 8.04*  CALCIUM 7.3* 7.1* 6.9* 7.5*  PHOS  --   --  5.8*  --      CBC: Recent Labs  Lab 09/17/17 1222 09/17/17 2039 09/18/17 0350 09/19/17 1521 09/20/17 0425  WBC 9.1  --   9.3 10.6 13.3*  NEUTROABS 6.5  --   --   --   --   HGB 6.0* 7.1* 6.2* 8.9* 9.4*  HCT 19.4* 21.9* 19.2* 27.3* 29.1*  MCV 85.7  --  84.5 86.1 86.9  PLT 238  --  195 205 218     No results found for: HEPBSAG, HEPBSAB, HEPBIGM    Microbiology:  No results found for this or any previous visit (from the past 240 hour(s)).  Coagulation Studies: No results for input(s): LABPROT, INR in the last 72 hours.  Urinalysis: No results for input(s): COLORURINE, LABSPEC, PHURINE, GLUCOSEU, HGBUR, BILIRUBINUR, KETONESUR, PROTEINUR, UROBILINOGEN, NITRITE, LEUKOCYTESUR in the last 72 hours.  Invalid input(s): APPERANCEUR    Imaging: No results found.   Medications:   . sodium chloride 30 mL/hr at 09/19/17 0636  . cefTRIAXone (ROCEPHIN)  IV Stopped (09/19/17 1756)   . amLODipine  10 mg Oral Daily  . azithromycin  500 mg Oral q1800  . calcium-vitamin D  2 tablet Oral Daily  . cholecalciferol  1,000 Units Oral Daily  . feeding supplement (NEPRO CARB STEADY)  237 mL Oral BID BM  . folic acid  1 mg Oral Daily  . heparin  5,000 Units Subcutaneous Q8H  . levothyroxine  50 mcg Oral QAC  breakfast  . magnesium oxide  400 mg Oral Daily  . metoprolol tartrate  50 mg Oral BID  . mirtazapine  15 mg Oral QHS  . multivitamin with minerals  1 tablet Oral Daily  . pantoprazole  40 mg Oral Daily  . thiamine  100 mg Oral Daily  . traZODone  50 mg Oral QHS   acetaminophen, alum & mag hydroxide-simeth, ondansetron **OR** ondansetron (ZOFRAN) IV, promethazine  Assessment/ Plan:  83 y.o. African-American female chronic kidney disease, history of alcohol abuse, gout, hypertension, anemia  presents for generalized weakness, nausea, poor appetite  1.  Acute renal failure on chronic kidney disease stage IV Baseline creatinine 2.50, GFR 20 from May 2018 2.  Urinary tract infection 3.  Hypertension 4.  Anemia, of chronic kidney disease  Patient may have developed acute renal failure/ATN from underlying  concurrent infection versus progression to end-stage renal disease Serum creatinine has only slightly improved with gentle IV hydration Electrolytes and volume status are acceptable No acute indication for dialysis at present However, if clinical condition does not improve and  patient continues to have poor appetite and nausea, we will have to consider dialysis.     LOS: [place] 3/23/201912:52 PM  [place], [place]  Note: This note was prepared with Dragon dictation. Any transcription errors are unintentional

## 2017-09-20 NOTE — Evaluation (Signed)
Physical Therapy Evaluation Patient Details Name: [name] Holmes MRN: [id number] DOB: 05/26/1935 Today's Date: 09/20/2017   History of Present Illness  pt is a 83 y.o F admitted on 09/17/2017 with dx of acute renal failure. She has a PMHx of CKD, anemia of chronic disease, hypertension, alcohol abuse. She has a home health nurse that called EMS when she was reporting significant weakness and dyspnea. she currently reports only feeling nauseas  Clinical Impression  Upon entering the room pt was laying in bed with RN and tech in the room. Pt is A&O x 4 and denies any pain, but repeatedly states she feels nauseas and sick. She requires mod A +1 for bed mobility to sitting EOB. She demonstrates limited LE strength but may be due to lack of effort. She tried to lay back into bed once on EOB requiring verbal/ tactile cues to sit up. She required +1 mod A to perform standing pivot transfer to the chair and fatigued quickly. She would benefit from physical therapy to improve strength, bed mobility, walking/ standing endurance and maximize her function. Upon discharge she would benefit from continued physical therapy via SNF.     Follow Up Recommendations SNF    Equipment Recommendations  None recommended by PT(pt reported having RW)    Recommendations for Other Services       Precautions / Restrictions Precautions Precautions: None Restrictions Weight Bearing Restrictions: No      Mobility  Bed Mobility Overal bed mobility: Needs Assistance Bed Mobility: Supine to Sit     Supine to sit: Mod assist     General bed mobility comments: required verbal / tactile cues for moving feet and sitting up, once sitting EOB pt kept wanting to lay back reporting she felt nauseas and required verbal cues and tactile cues to sit up   Transfers Overall transfer level: Needs assistance Equipment used: 1 person hand held assist(bil HHA blocking bil LE) Transfers: Stand Pivot Transfers   Stand pivot  transfers: Mod assist          Ambulation/Gait                Stairs            Wheelchair Mobility    Modified Rankin (Stroke Patients Only)       Balance                                             Pertinent Vitals/Pain Pain Assessment: No/denies pain    Home Living Family/patient expects to be discharged to:: Private residence Living Arrangements: Children;Other (Comment)   Type of Home: Apartment Home Access: Stairs to enter Entrance Stairs-Rails: None Entrance Stairs-Number of Steps: 1 Home Layout: One level Home Equipment: Environmental consultant - 2 wheels      Prior Function Level of Independence: Independent with assistive device(s)         Comments: RW     Hand Dominance   Dominant Hand: Right    Extremity/Trunk Assessment   Upper Extremity Assessment Upper Extremity Assessment: Defer to OT evaluation    Lower Extremity Assessment Lower Extremity Assessment: Generalized weakness(3+/5 in bil LE, possibly due to lack of exertion)       Communication   Communication: No difficulties(mumbles and is alittle difficulty to understand)  Cognition Arousal/Alertness: Lethargic;Awake/alert Behavior During Therapy: Restless Overall Cognitive Status: Within Functional Limits for tasks assessed  General Comments      Exercises     Assessment/Plan    PT Assessment Patient needs continued PT services  PT Problem List Decreased strength;Decreased balance;Decreased activity tolerance;Decreased mobility;Decreased safety awareness       PT Treatment Interventions Gait training;Stair training;Therapeutic exercise;Therapeutic activities;Balance training;DME instruction    PT Goals (Current goals can be found in the Care Plan section)  Acute Rehab PT Goals Patient Stated Goal: to get out of here PT Goal Formulation: With patient Time For Goal Achievement: 10/04/17 Potential  to Achieve Goals: Good    Frequency Min 2X/week   Barriers to discharge        Co-evaluation               AM-PAC PT "6 Clicks" Daily Activity  Outcome Measure Difficulty turning over in bed (including adjusting bedclothes, sheets and blankets)?: Unable Difficulty moving from lying on back to sitting on the side of the bed? : Unable Difficulty sitting down on and standing up from a chair with arms (e.g., wheelchair, bedside commode, etc,.)?: Unable Help needed moving to and from a bed to chair (including a wheelchair)?: A Lot Help needed walking in hospital room?: A Lot Help needed climbing 3-5 steps with a railing? : Total 6 Click Score: 8    End of Session Equipment Utilized During Treatment: Gait belt Activity Tolerance: Patient limited by lethargy;Patient limited by fatigue Patient left: in chair;with chair alarm set;with call bell/phone within reach Nurse Communication: Mobility status(and how session went) PT Visit Diagnosis: Unsteadiness on feet (R26.81);Muscle weakness (generalized) (M62.81);Other abnormalities of gait and mobility (R26.89)    Time: 3817-7116 PT Time Calculation (min) (ACUTE ONLY): 27 min   Charges:   PT Evaluation $PT Eval Moderate Complexity: 1 Mod     PT G Codes:        Filippa Yarbough PT, DPT, LAT, ATC  09/20/17  10:16 AM       Breezie Micucci, Cheryle Horsfall 09/20/2017, 10:13 AM

## 2017-09-20 NOTE — Progress Notes (Signed)
[facility] [place] NAME: [name] Holmes    MR#:  [id number]  DATE OF BIRTH:  02/25/1935  SUBJECTIVE:  CHIEF COMPLAINT:   Chief Complaint  Patient presents with  . Weakness   Lethargic and nausea. Poor UOP.  REVIEW OF SYSTEMS:  CONSTITUTIONAL: No fever,positive for fatigue or weakness.  EYES: No blurred or double vision.  EARS, NOSE, AND THROAT: No tinnitus or ear pain.  RESPIRATORY: No cough, shortness of breath, wheezing or hemoptysis.  CARDIOVASCULAR: No chest pain, orthopnea, edema.  GASTROINTESTINAL: No nausea, vomiting, diarrhea or abdominal pain.  GENITOURINARY: No dysuria, hematuria.  ENDOCRINE: No polyuria, nocturia,  HEMATOLOGY: No anemia, easy bruising or bleeding SKIN: No rash or lesion. MUSCULOSKELETAL: No joint pain or arthritis.   NEUROLOGIC: No tingling, numbness, weakness.  PSYCHIATRY: No anxiety or depression.   ROS  DRUG ALLERGIES:  No Known Allergies  VITALS:  Blood pressure (!) 124/57, pulse (!) 52, temperature 97.6 F (36.4 C), temperature source Oral, resp. rate 20, height 5\' 4"  (1.626 m), weight 60.4 kg (133 lb 1.6 oz), SpO2 92 %.  PHYSICAL EXAMINATION:  GENERAL:  83 y.o.-year-old patient lying in the bed with no acute distress.  EYES: Pupils equal, round, reactive to light and accommodation. No scleral icterus. Extraocular muscles intact.  HEENT: Head atraumatic, normocephalic. Oropharynx and nasopharynx clear.  NECK:  Supple, no jugular venous distention. No thyroid enlargement, no tenderness.  LUNGS: Normal breath sounds bilaterally, no wheezing, some crepitation. No use of accessory muscles of respiration.  CARDIOVASCULAR: S1, S2 normal. No murmurs, rubs, or gallops.  ABDOMEN: Soft, nontender, nondistended. Bowel sounds present. No organomegaly or mass.  EXTREMITIES: No pedal edema, cyanosis, or clubbing.  NEUROLOGIC: Cranial nerves II through XII are intact. Muscle strength 4/5 in all extremities.  Sensation intact. Gait not checked.  Slurred speech PSYCHIATRIC: The patient is alert and oriented x 2.  SKIN: No obvious rash, lesion, or ulcer.   Physical Exam LABORATORY PANEL:   CBC Recent Labs  Lab 09/20/17 0425  WBC 13.3*  HGB 9.4*  HCT 29.1*  PLT 218   ------------------------------------------------------------------------------------------------------------------  Chemistries  Recent Labs  Lab 09/17/17 1222  09/20/17 0425  NA 137   < > 133*  K 4.2   < > 4.6  CL 108   < > 106  CO2 18*   < > 18*  GLUCOSE 88   < > 158*  BUN 68*   < > 62*  CREATININE 9.33*   < > 8.04*  CALCIUM 7.3*   < > 7.5*  AST 18  --   --   ALT 7*  --   --   ALKPHOS 62  --   --   BILITOT 0.4  --   --    < > = values in this interval not displayed.   ------------------------------------------------------------------------------------------------------------------  Cardiac Enzymes Recent Labs  Lab 09/18/17 0350 09/18/17 2338  TROPONINI 0.04* 0.03*   ------------------------------------------------------------------------------------------------------------------  RADIOLOGY:  No results found.  ASSESSMENT AND PLAN:   Active Problems:   Acute renal failure (ARF) (HCC)   [name] Holmes  is a 83 y.o. female with a known history of CK D, anemia of chronic disease, hypertension, alcohol abuse presents to hospital secondary to extreme weakness and noted to be in acute renal failure.  *Acute kidney injury over CKD stage IV.  Patient is lethargic and has nausea.  Uremic symptoms. Will need hemodialysis.  Discussed with nephrology.  PermCath to be placed on  Monday.  Gentle IV fluids.  Monitor input and output.  Repeat labs in the morning.  *Worsening anemia of chronic disease.  1 unit packed RBC transfused.  Procrit per nephrology.  *Mild elevation in troponin due to pneumonia and acute kidney injury.  Not MI.  *History of alcohol abuse.  No alcohol intake 1 week prior to  admission.  *Right lower lobe committee acquired pneumonia. On antibiotics.  * Hypertension-continue Norvasc and metoprolol  * UTI Cultures not sent from emergency room.  On IV antibiotics.  * DVT prophylaxis Subcutaneous Heparin  All the records are reviewed and case discussed with Care Management/Social Workerr. Management plans discussed with the patient, family and they are in agreement.  CODE STATUS: Full.  TOTAL TIME TAKING CARE OF THIS PATIENT: 35 minutes.   Neita Carp M.D on 09/20/2017   Between 7am to 6pm - Pager - [phone]  After 6pm go to [email or web address] - password [email or web address] [facility]  Office  [phone]  CC: Primary care physician; Marguerita Merles, MD  Note: This dictation was prepared with Dragon dictation along with smaller phrase technology. Any transcriptional errors that result from this process are unintentional.

## 2017-09-21 LAB — BASIC METABOLIC PANEL
Anion gap: 10 (ref 5–15)
BUN: 58 mg/dL — AB (ref 6–20)
CALCIUM: 7.9 mg/dL — AB (ref 8.9–10.3)
CO2: 18 mmol/L — AB (ref 22–32)
CREATININE: 7.7 mg/dL — AB (ref 0.44–1.00)
Chloride: 105 mmol/L (ref 101–111)
GFR calc Af Amer: 5 mL/min — ABNORMAL LOW (ref >60)
GFR calc non Af Amer: 4 mL/min — ABNORMAL LOW (ref >60)
GLUCOSE: 116 mg/dL — AB (ref 65–99)
Potassium: 4.6 mmol/L (ref 3.5–5.1)
Sodium: 133 mmol/L — ABNORMAL LOW (ref 135–145)

## 2017-09-21 MED ORDER — SODIUM CHLORIDE 0.9 % IV SOLN
1.0000 g | INTRAVENOUS | Status: DC
  Administered 2017-09-21: 15:00:00 1 g via INTRAVENOUS
  Filled 2017-09-21 (×2): qty 10

## 2017-09-21 MED ORDER — AZITHROMYCIN 500 MG PO TABS
500.0000 mg | ORAL_TABLET | Freq: Every day | ORAL | Status: DC
  Administered 2017-09-21: 500 mg via ORAL
  Filled 2017-09-21: qty 1

## 2017-09-21 MED ORDER — ORAL CARE MOUTH RINSE
15.0000 mL | Freq: Two times a day (BID) | OROMUCOSAL | Status: DC
  Administered 2017-09-21: 15 mL via OROMUCOSAL

## 2017-09-21 NOTE — Progress Notes (Signed)
[place] [facility] [place] NAME: [name] Holmes    MR#:  [id number]  DATE OF BIRTH:  Sep 17, 1934  SUBJECTIVE:  CHIEF COMPLAINT:   Chief Complaint  Patient presents with  . Weakness   Lethargic and nausea. Poor oral intake  REVIEW OF SYSTEMS:  CONSTITUTIONAL: No fever,positive for fatigue or weakness.  EYES: No blurred or double vision.  EARS, NOSE, AND THROAT: No tinnitus or ear pain.  RESPIRATORY: No cough, shortness of breath, wheezing or hemoptysis.  CARDIOVASCULAR: No chest pain, orthopnea, edema.  GASTROINTESTINAL: Complains of nausea.  No abdominal pain. GENITOURINARY: No dysuria, hematuria.  ENDOCRINE: No polyuria, nocturia,  HEMATOLOGY: No anemia, easy bruising or bleeding SKIN: No rash or lesion. MUSCULOSKELETAL: No joint pain or arthritis.   NEUROLOGIC: No tingling, numbness, weakness.  PSYCHIATRY: No anxiety or depression.   ROS  DRUG ALLERGIES:  No Known Allergies  VITALS:  Blood pressure 132/65, pulse 64, temperature (!) 97.5 F (36.4 C), temperature source Rectal, resp. rate 16, height 5\' 4"  (1.626 m), weight 60.4 kg (133 lb 1.6 oz), SpO2 98 %.  PHYSICAL EXAMINATION:  GENERAL:  83 y.o.-year-old patient lying in the bed with no acute distress.  EYES: Pupils equal, round, reactive to light and accommodation. No scleral icterus. Extraocular muscles intact.  HEENT: Head atraumatic, normocephalic. Oropharynx and nasopharynx clear.  NECK:  Supple, no jugular venous distention. No thyroid enlargement, no tenderness.  LUNGS: Normal breath sounds bilaterally, no wheezing, some crepitation. No use of accessory muscles of respiration.  CARDIOVASCULAR: S1, S2 normal. No murmurs, rubs, or gallops.  ABDOMEN: Soft, nontender, nondistended. Bowel sounds present. No organomegaly or mass.  EXTREMITIES: No pedal edema, cyanosis, or clubbing.  NEUROLOGIC: Cranial nerves II through XII are intact. Muscle strength 4/5 in all extremities.  Sensation intact. Gait not checked.  Slurred speech PSYCHIATRIC: The patient is alert and oriented x 2.  SKIN: No obvious rash, lesion, or ulcer.   Physical Exam LABORATORY PANEL:   CBC Recent Labs  Lab 09/20/17 0425  WBC 13.3*  HGB 9.4*  HCT 29.1*  PLT 218   ------------------------------------------------------------------------------------------------------------------  Chemistries  Recent Labs  Lab 09/17/17 1222  09/21/17 0412  NA 137   < > 133*  K 4.2   < > 4.6  CL 108   < > 105  CO2 18*   < > 18*  GLUCOSE 88   < > 116*  BUN 68*   < > 58*  CREATININE 9.33*   < > 7.70*  CALCIUM 7.3*   < > 7.9*  AST 18  --   --   ALT 7*  --   --   ALKPHOS 62  --   --   BILITOT 0.4  --   --    < > = values in this interval not displayed.   ------------------------------------------------------------------------------------------------------------------  Cardiac Enzymes Recent Labs  Lab 09/18/17 0350 09/18/17 2338  TROPONINI 0.04* 0.03*   ------------------------------------------------------------------------------------------------------------------  RADIOLOGY:  No results found.  ASSESSMENT AND PLAN:   Active Problems:   Acute renal failure (ARF) (HCC)   Sullivan Blasing  is a 83 y.o. female with a known history of CK D, anemia of chronic disease, hypertension, alcohol abuse presents to hospital secondary to extreme weakness and noted to be in acute renal failure.  *Acute kidney injury over CKD stage IV.  Patient is lethargic and has nausea.  Uremic symptoms. Will need hemodialysis.  Discussed with nephrology.  PermCath to be placed on Monday.  Monitor input and output.  Repeat labs in the morning.  *Worsening anemia of chronic disease.  s/p 1 unit packed RBC transfused.  Procrit per nephrology.  *Mild elevation in troponin due to pneumonia and acute kidney injury.  Not MI.  *History of alcohol abuse.  No alcohol intake 1 week prior to admission.  *Right  lower lobe committee acquired pneumonia. On antibiotics. 3 more days  * Hypertension-continue Norvasc and metoprolol  * UTI Cultures not sent from emergency room.  On IV ceftriaxone.  * DVT prophylaxis Subcutaneous Heparin  All the records are reviewed and case discussed with Care Management/Social Workerr. Management plans discussed with the patient, family and they are in agreement.  CODE STATUS: Full.  TOTAL TIME TAKING CARE OF THIS PATIENT: 35 minutes.   Neita Carp M.D on 09/21/2017   Between 7am to 6pm - Pager - [phone]  After 6pm go to [email or web address] - password [email or web address] [facility]  Office  [phone]  CC: Primary care physician; Marguerita Merles, MD  Note: This dictation was prepared with Dragon dictation along with smaller phrase technology. Any transcriptional errors that result from this process are unintentional.

## 2017-09-21 NOTE — Progress Notes (Signed)
[facility], [place] 09/21/17  Subjective:   Patient known to our practice from previous admissions and outpatient follow-up She has known chronic kidney disease She presents this time for generalized weakness She also reports nausea and vomiting prior to admission  Baseline creatinine is 2.50, GFR 20 from May 2018 Admission creatinine 9.33, GFR 4. Today, slight improvement to Cr to 7.70  Lethargic Able to answer few questions   Objective:  Vital signs in last 24 hours:  Temp:  [97.5 F (36.4 C)-98.9 F (37.2 C)] 97.5 F (36.4 C) (03/24 0941) Pulse Rate:  [52-64] 64 (03/24 0941) Resp:  [16-24] 16 (03/24 0941) BP: (124-162)/(57-73) 132/65 (03/24 0941) SpO2:  [88 %-98 %] 98 % (03/24 0941)  Weight change:  Filed Weights   09/17/17 1220 09/17/17 1551  Weight: 150 lb (68 kg) 133 lb 1.6 oz (60.4 kg)    Intake/Output:    Intake/Output Summary (Last 24 hours) at 09/21/2017 1243 Last data filed at 09/21/2017 1013 Gross per 24 hour  Intake 763.5 ml  Output 0 ml  Net 763.5 ml     Physical Exam: General:  No acute distress, laying in the bed  HEENT  anicteric, moist oral mucous membranes  Neck  supple  Pulm/lungs  normal breathing effort  CVS/Heart  irregular rhythm  Abdomen:   Soft, nontender  Extremities:  + peripheral edema  Neurologic:  lethargic but arousable and Able to answer questions  Skin:  No acute rashes          Basic Metabolic Panel:  Recent Labs  Lab 09/17/17 1222 09/18/17 0350 09/19/17 0421 09/20/17 0425 09/21/17 0412  NA 137 142 135 133* 133*  K 4.2 4.3 4.4 4.6 4.6  CL 108 114* 109 106 105  CO2 18* 18* 17* 18* 18*  GLUCOSE 88 75 96 158* 116*  BUN 68* 69* 63* 62* 58*  CREATININE 9.33* 9.03* 8.39* 8.04* 7.70*  CALCIUM 7.3* 7.1* 6.9* 7.5* 7.9*  PHOS  --   --  5.8*  --   --      CBC: Recent Labs  Lab 09/17/17 1222 09/17/17 2039 09/18/17 0350 09/19/17 1521 09/20/17 0425  WBC 9.1  --  9.3 10.6 13.3*   NEUTROABS 6.5  --   --   --   --   HGB 6.0* 7.1* 6.2* 8.9* 9.4*  HCT 19.4* 21.9* 19.2* 27.3* 29.1*  MCV 85.7  --  84.5 86.1 86.9  PLT 238  --  195 205 218     No results found for: HEPBSAG, HEPBSAB, HEPBIGM    Microbiology:  No results found for this or any previous visit (from the past 240 hour(s)).  Coagulation Studies: No results for input(s): LABPROT, INR in the last 72 hours.  Urinalysis: No results for input(s): COLORURINE, LABSPEC, PHURINE, GLUCOSEU, HGBUR, BILIRUBINUR, KETONESUR, PROTEINUR, UROBILINOGEN, NITRITE, LEUKOCYTESUR in the last 72 hours.  Invalid input(s): APPERANCEUR    Imaging: No results found.   Medications:   . cefTRIAXone (ROCEPHIN)  IV     . amLODipine  10 mg Oral Daily  . azithromycin  500 mg Oral q1800  . calcium-vitamin D  2 tablet Oral Daily  . cholecalciferol  1,000 Units Oral Daily  . feeding supplement (NEPRO CARB STEADY)  237 mL Oral BID BM  . folic acid  1 mg Oral Daily  . heparin  5,000 Units Subcutaneous Q8H  . levothyroxine  50 mcg Oral QAC breakfast  . magnesium oxide  400 mg Oral Daily  .  mouth rinse  15 mL Mouth Rinse BID  . metoprolol tartrate  50 mg Oral BID  . mirtazapine  15 mg Oral QHS  . multivitamin with minerals  1 tablet Oral Daily  . pantoprazole  40 mg Oral Daily  . thiamine  100 mg Oral Daily   acetaminophen, alum & mag hydroxide-simeth, ondansetron **OR** ondansetron (ZOFRAN) IV, promethazine  Assessment/ Plan:  83 y.o. African-American female chronic kidney disease, history of alcohol abuse, gout, hypertension, anemia  presents for generalized weakness, nausea, poor appetite  1.  Acute renal failure on chronic kidney disease stage IV vs ESRD Baseline creatinine 2.50, GFR 20 from May 2018 2.  Urinary tract infection 3.  Hypertension 4.  Anemia, of chronic kidney disease 5.  Acute pulmonary edema  Patient may have developed acute renal failure/ATN from underlying concurrent infection versus progression  to end-stage renal disease Serum creatinine has only slightly improved with gentle IV hydration and now she has developed Pulmonary edema.  She also is nauseous likely from uremia. She is almost anuric,  Discussed with patient regarding HD. She states that she "has no choice". She would be ok with proceeding. Also discussed with her POA , Carola Rhine, who will be up on the floor later after church to discuss further with her.  Prior to hospitalization, she was able to stand with walker and sit on chair. Could walk very little Patient has baseline poor health and would make marginal candidate for chronic HD Will continue to discuss with family Vascular surgery has been consulted for evaluation for permcath placement.      LOS: [place] 3/24/201912:43 PM  [place], [place]  Note: This note was prepared with Dragon dictation. Any transcription errors are unintentional

## 2017-09-21 NOTE — Progress Notes (Signed)
Advance care planning  Patient is confused at this time.  Discussed with patient's healthcare power of attorney Carola Rhine at bedside.  We discussed regarding patient's worsening renal function, acute kidney injury, acute encephalopathy, nausea. [name] tells me that patient has always told her that she would not want resuscitation or intubation.  She also did not want to be on hemodialysis.  Recently patient's sister who was Elder to her had chronic kidney disease and chose not to be on dialysis and passed to be on comfort measures.  Patient wanted to take a similar course.  At this time patient has been seeing yes sometimes and no sometimes to hemodialysis.  [name] wants to talk to rest of the family and make that decision later today. After discussing with [name] and change the CODE STATUS to DO NOT RESUSCITATE.  Time spent 20 minutes

## 2017-09-21 NOTE — Consult Note (Signed)
Reason for Consult: Acute on Chronic renal failure- CKD IV Referring Physician: Dr. Tyna Jaksch [name] Holmes is an 83 y.o. female.  HPI: Patient with CKD IV, HTN presented with generalized weakness, nausea, emesis, low urine output- acute renal failure, no improvement with conservtive measures while in house now with Holmes GFR of 5, Cr-8. Needs long term HD.  Past Medical History:  Diagnosis Date  . Anemia of chronic renal failure   . Arthritis   . CKD (chronic kidney disease)   . ETOH abuse   . Gout   . Hypertension     Past Surgical History:  Procedure Laterality Date  . ABDOMINAL HYSTERECTOMY    . COLONOSCOPY WITH PROPOFOL N/Holmes 12/08/2014   Procedure: COLONOSCOPY WITH PROPOFOL;  Surgeon: Josefine Class, MD;  Location: [facility] ENDOSCOPY;  Service: Endoscopy;  Laterality: N/Holmes;  . ESOPHAGOGASTRODUODENOSCOPY (EGD) WITH PROPOFOL N/Holmes 11/08/2014   Procedure: ESOPHAGOGASTRODUODENOSCOPY (EGD) WITH PROPOFOL;  Surgeon: Lollie Sails, MD;  Location: [facility] ENDOSCOPY;  Service: Endoscopy;  Laterality: N/Holmes;  . GIVENS CAPSULE STUDY N/Holmes 08/26/2015   Procedure: GIVENS CAPSULE STUDY;  Surgeon: Josefine Class, MD;  Location: [facility] ENDOSCOPY;  Service: Endoscopy;  Laterality: N/Holmes;    Family History  Problem Relation Age of Onset  . Hypertension Father   . Hypertension Mother     Social History:  reports that she has quit smoking. Her smoking use included cigarettes. She has never used smokeless tobacco. She reports that she drinks about 7.2 oz of alcohol per week. She reports that she does not use drugs.  Allergies: No Known Allergies  Medications: I have reviewed the patient's current medications.  Results for orders placed or performed during the hospital encounter of 09/17/17 (from the past 48 hour(s))  CBC     Status: Abnormal   Collection Time: 09/19/17  3:21 PM  Result Value Ref Range   WBC 10.6 3.6 - 11.0 K/uL   RBC 3.17 (L) 3.80 - 5.20 MIL/uL   Hemoglobin 8.9 (L) 12.0 - 16.0 g/dL   HCT 27.3 (L) 35.0 - 47.0 %   MCV 86.1 80.0 - 100.0 fL   MCH 28.1 26.0 - 34.0 pg   MCHC 32.6 32.0 - 36.0 g/dL   RDW 15.1 (H) 11.5 - 14.5 %   Platelets 205 150 - 440 K/uL    Comment: Performed at [facility], [address] [place], [place] [address]  CBC     Status: Abnormal   Collection Time: 09/20/17  4:25 AM  Result Value Ref Range   WBC 13.3 (H) 3.6 - 11.0 K/uL   RBC 3.35 (L) 3.80 - 5.20 MIL/uL   Hemoglobin 9.4 (L) 12.0 - 16.0 g/dL   HCT 29.1 (L) 35.0 - 47.0 %   MCV 86.9 80.0 - 100.0 fL   MCH 27.9 26.0 - 34.0 pg   MCHC 32.1 32.0 - 36.0 g/dL   RDW 15.4 (H) 11.5 - 14.5 %   Platelets 218 150 - 440 K/uL    Comment: Performed at [facility], [address] [place],  [address]  Basic metabolic panel     Status: Abnormal   Collection Time: 09/20/17  4:25 AM  Result Value Ref Range   Sodium 133 (L) 135 - 145 mmol/L   Potassium 4.6 3.5 - 5.1 mmol/L   Chloride 106 101 - 111 mmol/L   CO2 18 (L) 22 - 32 mmol/L   Glucose, Bld 158 (H) 65 - 99 mg/dL   BUN 62 (H)  6 - 20 mg/dL   Creatinine, Ser 8.04 (H) 0.44 - 1.00 mg/dL   Calcium 7.5 (L) 8.9 - 10.3 mg/dL   GFR calc non Af Amer 4 (L) >60 mL/min   GFR calc Af Amer 5 (L) >60 mL/min    Comment: (NOTE) The eGFR has been calculated using the CKD EPI equation. This calculation has not been validated in all clinical situations. eGFR's persistently <60 mL/min signify possible Chronic Kidney Disease.    Anion gap 9 5 - 15    Comment: Performed at [facility], [address] [place], [place] [address]  Basic metabolic panel     Status: Abnormal   Collection Time: 09/21/17  4:12 AM  Result Value Ref Range   Sodium 133 (L) 135 - 145 mmol/L   Potassium 4.6 3.5 - 5.1 mmol/L   Chloride 105 101 - 111 mmol/L   CO2 18 (L) 22 - 32 mmol/L   Glucose, Bld 116 (H) 65 - 99 mg/dL   BUN 58 (H) 6 - 20 mg/dL   Creatinine, Ser 7.70 (H) 0.44 - 1.00 mg/dL   Calcium 7.9 (L) 8.9 - 10.3 mg/dL   GFR calc non Af Amer 4  (L) >60 mL/min   GFR calc Af Amer 5 (L) >60 mL/min    Comment: (NOTE) The eGFR has been calculated using the CKD EPI equation. This calculation has not been validated in all clinical situations. eGFR's persistently <60 mL/min signify possible Chronic Kidney Disease.    Anion gap 10 5 - 15    Comment: Performed at [facility], [address] [place], [place] [address]    No results found.  Review of Systems  Constitutional: Positive for malaise/fatigue.  Respiratory: Positive for shortness of breath.   Cardiovascular: Negative for chest pain.  Musculoskeletal: Negative.    Blood pressure 132/65, pulse 64, temperature (!) 97.5 F (36.4 C), temperature source Rectal, resp. rate 16, height 5' 4"  (1.626 m), weight 60.4 kg (133 lb 1.6 oz), SpO2 98 %. Physical Exam  Nursing note and vitals reviewed. Constitutional: She appears well-developed.  Neck: Normal range of motion. Neck supple.  Cardiovascular: Normal rate and regular rhythm.  GI: Soft. Bowel sounds are normal. There is no tenderness.  Neurological:  lethargic  Skin: Skin is warm.    Assessment/Plan: Worsening acute renal failure on CKD IV now requiring HD Will discuss with family/guardian- patient unsure about  HD. Will tentatively plan for Permacath placement tomorrow.  [name] Holmes 09/21/2017, 10:56 AM

## 2017-09-21 NOTE — Progress Notes (Signed)
Pt's rectal temperature is 94.2. Dr. Darvin Neighbours notified. Orders to place pt on bear hugger received. Service response called for equipment delivery. Order for blood cultures also placed by Dr. Darvin Neighbours.

## 2017-09-21 NOTE — Progress Notes (Signed)
Pt having nausea and intermittent dry heaves this morning. Phenergan administered and pt was able to take her amlodipine and metoprolol, but refused all other meds due to nausea at this time. Pt also was found to be 88-90% SPO2 on RA. Pt placed on 2L of O2 with SPO2 increase to 99%. Dr. Candiss Norse notified on rounds, and he stated to keep her on oxygen to maintain her saturations.

## 2017-09-22 ENCOUNTER — Encounter
Admission: EM | Disposition: A | Discharge status: Hospice | Payer: Self-pay | Source: Home / Self Care | Attending: Internal Medicine

## 2017-09-22 DIAGNOSIS — N179 Acute kidney failure, unspecified: Secondary | ICD-10-CM

## 2017-09-22 DIAGNOSIS — Z515 Encounter for palliative care: Secondary | ICD-10-CM

## 2017-09-22 DIAGNOSIS — J189 Pneumonia, unspecified organism: Secondary | ICD-10-CM

## 2017-09-22 DIAGNOSIS — R110 Nausea: Secondary | ICD-10-CM

## 2017-09-22 DIAGNOSIS — N189 Chronic kidney disease, unspecified: Secondary | ICD-10-CM

## 2017-09-22 DIAGNOSIS — J181 Lobar pneumonia, unspecified organism: Secondary | ICD-10-CM

## 2017-09-22 DIAGNOSIS — N19 Unspecified kidney failure: Secondary | ICD-10-CM

## 2017-09-22 DIAGNOSIS — D631 Anemia in chronic kidney disease: Secondary | ICD-10-CM

## 2017-09-22 LAB — RENAL FUNCTION PANEL
ALBUMIN: 2.8 g/dL — AB (ref 3.5–5.0)
ANION GAP: 9 (ref 5–15)
BUN: 61 mg/dL — AB (ref 6–20)
CALCIUM: 7.8 mg/dL — AB (ref 8.9–10.3)
CO2: 17 mmol/L — AB (ref 22–32)
CREATININE: 8.48 mg/dL — AB (ref 0.44–1.00)
Chloride: 104 mmol/L (ref 101–111)
GFR calc Af Amer: 5 mL/min — ABNORMAL LOW (ref >60)
GFR calc non Af Amer: 4 mL/min — ABNORMAL LOW (ref >60)
Glucose, Bld: 79 mg/dL (ref 65–99)
PHOSPHORUS: 5.9 mg/dL — AB (ref 2.5–4.6)
Potassium: 5 mmol/L (ref 3.5–5.1)
SODIUM: 130 mmol/L — AB (ref 135–145)

## 2017-09-22 LAB — CBC
HCT: 25 % — ABNORMAL LOW (ref 35.0–47.0)
Hemoglobin: 8.2 g/dL — ABNORMAL LOW (ref 12.0–16.0)
MCH: 28.4 pg (ref 26.0–34.0)
MCHC: 32.8 g/dL (ref 32.0–36.0)
MCV: 86.6 fL (ref 80.0–100.0)
PLATELETS: 202 10*3/uL (ref 150–440)
RBC: 2.89 MIL/uL — ABNORMAL LOW (ref 3.80–5.20)
RDW: 15 % — AB (ref 11.5–14.5)
WBC: 9.4 10*3/uL (ref 3.6–11.0)

## 2017-09-22 SURGERY — DIALYSIS/PERMA CATHETER INSERTION
Anesthesia: Moderate Sedation

## 2017-09-22 MED ORDER — MIDAZOLAM HCL 5 MG/5ML IJ SOLN
INTRAMUSCULAR | Status: AC
  Filled 2017-09-22: qty 5

## 2017-09-22 MED ORDER — MORPHINE SULFATE (PF) 2 MG/ML IV SOLN: 1.0000 mg | INTRAVENOUS | Status: DC | PRN

## 2017-09-22 MED ORDER — LORAZEPAM 1 MG PO TABS: 1.0000 mg | ORAL_TABLET | ORAL | Status: DC | PRN

## 2017-09-22 MED ORDER — FENTANYL CITRATE (PF) 100 MCG/2ML IJ SOLN
INTRAMUSCULAR | Status: AC
  Filled 2017-09-22: qty 2

## 2017-09-22 MED ORDER — HYDROMORPHONE HCL 1 MG/ML IJ SOLN: 0.5000 mg | INTRAMUSCULAR | Status: DC | PRN

## 2017-09-22 MED ORDER — LORAZEPAM 2 MG/ML IJ SOLN: 1.0000 mg | INTRAMUSCULAR | Status: DC | PRN

## 2017-09-22 MED ORDER — METOCLOPRAMIDE HCL 5 MG/ML IJ SOLN
10.0000 mg | Freq: Four times a day (QID) | INTRAMUSCULAR | Status: DC
  Administered 2017-09-22 – 2017-09-23 (×4): 10 mg via INTRAVENOUS
  Filled 2017-09-22 (×4): qty 2

## 2017-09-22 MED ORDER — HEPARIN SODIUM (PORCINE) 1000 UNIT/ML IJ SOLN
INTRAMUSCULAR | Status: AC
  Filled 2017-09-22: qty 1

## 2017-09-22 MED ORDER — LORAZEPAM 2 MG/ML PO CONC: 1.0000 mg | ORAL | Status: DC | PRN

## 2017-09-22 MED ORDER — ACETAMINOPHEN 325 MG PO TABS: 650.0000 mg | ORAL_TABLET | Freq: Four times a day (QID) | ORAL | Status: DC | PRN

## 2017-09-22 MED ORDER — HALOPERIDOL LACTATE 5 MG/ML IJ SOLN: 2.0000 mg | Freq: Four times a day (QID) | INTRAMUSCULAR | Status: DC | PRN

## 2017-09-22 MED ORDER — MORPHINE SULFATE (CONCENTRATE) 10 MG/0.5ML PO SOLN: 5.0000 mg | ORAL | Status: DC | PRN

## 2017-09-22 MED ORDER — GLYCOPYRROLATE 0.2 MG/ML IJ SOLN
0.2000 mg | INTRAMUSCULAR | Status: DC | PRN
  Filled 2017-09-22: qty 1

## 2017-09-22 MED ORDER — ACETAMINOPHEN 650 MG RE SUPP: 650.0000 mg | Freq: Four times a day (QID) | RECTAL | Status: DC | PRN

## 2017-09-22 NOTE — Care Management Important Message (Signed)
Important Message  Patient Details  Name: [name] Holmes MRN: [id number] Date of Birth: 01-23-1935   Medicare Important Message Given:  Yes  Pt. Feeling sick on her stomach and did not want to sign document.  Juliann Pulse A Nollie Shiflett 09/22/2017, 3:12 PM

## 2017-09-22 NOTE — Progress Notes (Signed)
ACP note  Family meeting in patient's room.  Patient is drowsy.  Her healthcare power of attorney Carola Rhine at bedside.  Nurse Velna Hatchet and palliative care nurse practitioner Merleen Nicely.  Also on conference call over the phone number Rise Paganini and Horton Finer who are patients children.  Patient has been refusing dialysis.  Horton Finer her son wants to try and see how patient would do with dialysis.  Family asked me if patient would benefit from dialysis and if she can tolerate it.  I described patient's condition but also recommended that at this point it is time to respect patient's wishes.  Also patient seems extremely weak and with comorbidities would not be a good hemodialysis candidate.  Family agrees.  Discussed with palliative care.  Patient has less than 2 weeks of survival at this time.  Candidate for hospice home.  Comfort measures.  Patient is DO NOT INTUBATE and DO NOT RESUSCITATE.  Time spent 18 minutes

## 2017-09-22 NOTE — Progress Notes (Signed)
[place] at [place] NAME: [name] Holmes    MR#:  [id number]  DATE OF BIRTH:  03/05/1935  SUBJECTIVE:  CHIEF COMPLAINT:   Chief Complaint  Patient presents with  . Weakness   Patient lethargic.  Continues to have nausea.  REVIEW OF SYSTEMS:  Unable to obtain due to change in mental status  ROS  DRUG ALLERGIES:  No Known Allergies  VITALS:  Blood pressure (!) 151/65, pulse 62, temperature 98.3 F (36.8 C), temperature source Oral, resp. rate 18, height 5\' 4"  (1.626 m), weight 60.4 kg (133 lb 1.6 oz), SpO2 99 %.  PHYSICAL EXAMINATION:  GENERAL:  83 y.o.-year-old patient lying in the bed  EYES: Pupils equal, round, reactive to light and accommodation. No scleral icterus. Extraocular muscles intact.  HEENT: Head atraumatic, normocephalic. Oropharynx and nasopharynx clear.  NECK:  Supple, no jugular venous distention. No thyroid enlargement, no tenderness.  LUNGS: Normal breath sounds bilaterally, no wheezing, some crepitation. No use of accessory muscles of respiration.  CARDIOVASCULAR: S1, S2 normal. No murmurs, rubs, or gallops.  ABDOMEN: Soft, nontender, nondistended. Bowel sounds present. No organomegaly or mass.  EXTREMITIES: No pedal edema, cyanosis, or clubbing.  NEUROLOGIC: Slurred speech which is chronic.  Moves all extremities. PSYCHIATRIC: The patient is drowsy SKIN: No obvious rash, lesion, or ulcer.   Physical Exam LABORATORY PANEL:   CBC Recent Labs  Lab 09/22/17 0351  WBC 9.4  HGB 8.2*  HCT 25.0*  PLT 202   ------------------------------------------------------------------------------------------------------------------  Chemistries  Recent Labs  Lab 09/17/17 1222  09/22/17 0351  NA 137   < > 130*  K 4.2   < > 5.0  CL 108   < > 104  CO2 18*   < > 17*  GLUCOSE 88   < > 79  BUN 68*   < > 61*  CREATININE 9.33*   < > 8.48*  CALCIUM 7.3*   < > 7.8*  AST 18  --   --   ALT 7*  --   --   ALKPHOS 62  --    --   BILITOT 0.4  --   --    < > = values in this interval not displayed.   ------------------------------------------------------------------------------------------------------------------  Cardiac Enzymes Recent Labs  Lab 09/18/17 0350 09/18/17 2338  TROPONINI 0.04* 0.03*   ------------------------------------------------------------------------------------------------------------------  RADIOLOGY:  No results found.  ASSESSMENT AND PLAN:   Active Problems:   Acute renal failure (ARF) (HCC)   [name] Holmes  is a 83 y.o. female with a known history of CK D, anemia of chronic disease, hypertension, alcohol abuse presents to hospital secondary to extreme weakness and noted to be in acute renal failure.  *Acute kidney injury over CKD stage IV.  Patient is lethargic and has nausea.  Uremic symptoms. Will need hemodialysis.   Patient did not want hemodialysis previously.  Her healthcare power of attorney wants to discuss with rest of the family and make a decision later today.  *Worsening anemia of chronic disease.  s/p 1 unit packed RBC transfused.  Procrit per nephrology.  *Mild elevation in troponin due to pneumonia and acute kidney injury.  Not MI.  *History of alcohol abuse.  No alcohol intake 1 week prior to admission.  *Right lower lobe committee acquired pneumonia. On antibiotics. 2 more days  * Hypertension-continue Norvasc and metoprolol  * UTI Cultures not sent from emergency room.   On IV ceftriaxone.  * DVT prophylaxis Subcutaneous Heparin  All  the records are reviewed and case discussed with Care Management/Social Workerr. Management plans discussed with the patient, family and they are in agreement.  CODE STATUS: DNR  TOTAL TIME TAKING CARE OF THIS PATIENT: 35 minutes.   Neita Carp M.D on 09/22/2017   Between 7am to 6pm - Pager - [phone]  After 6pm go to [email or web address] - password [email or web address] [facility]  Office   [phone]  CC: Primary care physician; Marguerita Merles, MD  Note: This dictation was prepared with Dragon dictation along with smaller phrase technology. Any transcriptional errors that result from this process are unintentional.

## 2017-09-22 NOTE — Progress Notes (Signed)
[place], [place] 09/22/17  Subjective:  Patient's healthcare power of attorney at bedside. To have decided against dialysis. This certainly appears to be reasonable. Overall patient still quite weak.   Objective:  Vital signs in last 24 hours:  Temp:  [94.2 F (34.6 C)-99.5 F (37.5 C)] 98.3 F (36.8 C) (03/25 0424) Pulse Rate:  [55-71] 62 (03/25 0424) Resp:  [17-20] 18 (03/25 0424) BP: (136-151)/(65-92) 151/65 (03/25 0424) SpO2:  [95 %-99 %] 99 % (03/25 0424)  Weight change:  Filed Weights   09/17/17 1220 09/17/17 1551  Weight: 68 kg (150 lb) 60.4 kg (133 lb 1.6 oz)    Intake/Output:    Intake/Output Summary (Last 24 hours) at 09/22/2017 1207 Last data filed at 09/21/2017 2110 Gross per 24 hour  Intake 100 ml  Output -  Net 100 ml     Physical Exam: General:  No acute distress, laying in the bed  HEENT  anicteric, moist oral mucous membranes  Neck  supple  Pulm/lungs  normal breathing effort, CTAB  CVS/Heart  irregular rhythm  Abdomen:   Soft, nontender  Extremities:  + peripheral edema  Neurologic:  awake, will follow simple commands  Skin:  No acute rashes          Basic Metabolic Panel:  Recent Labs  Lab 09/18/17 0350 09/19/17 0421 09/20/17 0425 09/21/17 0412 09/22/17 0351  NA 142 135 133* 133* 130*  K 4.3 4.4 4.6 4.6 5.0  CL 114* 109 106 105 104  CO2 18* 17* 18* 18* 17*  GLUCOSE 75 96 158* 116* 79  BUN 69* 63* 62* 58* 61*  CREATININE 9.03* 8.39* 8.04* 7.70* 8.48*  CALCIUM 7.1* 6.9* 7.5* 7.9* 7.8*  PHOS  --  5.8*  --   --  5.9*     CBC: Recent Labs  Lab 09/17/17 1222 09/17/17 2039 09/18/17 0350 09/19/17 1521 09/20/17 0425 09/22/17 0351  WBC 9.1  --  9.3 10.6 13.3* 9.4  NEUTROABS 6.5  --   --   --   --   --   HGB 6.0* 7.1* 6.2* 8.9* 9.4* 8.2*  HCT 19.4* 21.9* 19.2* 27.3* 29.1* 25.0*  MCV 85.7  --  84.5 86.1 86.9 86.6  PLT 238  --  195 205 218 202     No results found for: HEPBSAG, HEPBSAB,  HEPBIGM    Microbiology:  Recent Results (from the past 240 hour(s))  CULTURE, BLOOD (ROUTINE X 2) w Reflex to ID Panel     Status: None (Preliminary result)   Collection Time: 09/21/17  3:01 PM  Result Value Ref Range Status   Specimen Description BLOOD RT [facility]  Final   Special Requests   Final    BOTTLES DRAWN AEROBIC AND ANAEROBIC Blood Culture results may not be optimal due to an excessive volume of blood received in culture bottles   Culture   Final    NO GROWTH < 24 HOURS Performed at [facility], [address] [place], [place] [address]    Report Status PENDING  Incomplete  CULTURE, BLOOD (ROUTINE X 2) w Reflex to ID Panel     Status: None (Preliminary result)   Collection Time: 09/21/17  3:12 PM  Result Value Ref Range Status   Specimen Description BLOOD LT [facility]  Final   Special Requests   Final    BOTTLES DRAWN AEROBIC AND ANAEROBIC Blood Culture results may not be optimal due to an excessive volume of blood received in culture bottles  Culture   Final    NO GROWTH < 24 HOURS Performed at [facility], [address] [place], [place] [address]    Report Status PENDING  Incomplete    Coagulation Studies: No results for input(s): LABPROT, INR in the last 72 hours.  Urinalysis: No results for input(s): COLORURINE, LABSPEC, PHURINE, GLUCOSEU, HGBUR, BILIRUBINUR, KETONESUR, PROTEINUR, UROBILINOGEN, NITRITE, LEUKOCYTESUR in the last 72 hours.  Invalid input(s): APPERANCEUR    Imaging: No results found.   Medications:   . cefTRIAXone (ROCEPHIN)  IV Stopped (09/21/17 1548)   . amLODipine  10 mg Oral Daily  . azithromycin  500 mg Oral q1800  . calcium-vitamin D  2 tablet Oral Daily  . cholecalciferol  1,000 Units Oral Daily  . feeding supplement (NEPRO CARB STEADY)  237 mL Oral BID BM  . folic acid  1 mg Oral Daily  . heparin  5,000 Units Subcutaneous Q8H  . levothyroxine  50 mcg Oral QAC breakfast  . magnesium oxide  400 mg Oral Daily  .  mouth rinse  15 mL Mouth Rinse BID  . metoprolol tartrate  50 mg Oral BID  . mirtazapine  15 mg Oral QHS  . multivitamin with minerals  1 tablet Oral Daily  . pantoprazole  40 mg Oral Daily  . thiamine  100 mg Oral Daily   acetaminophen, alum & mag hydroxide-simeth, ondansetron **OR** ondansetron (ZOFRAN) IV, promethazine  Assessment/ Plan:  83 y.o. African-American female chronic kidney disease, history of alcohol abuse, gout, hypertension, anemia  presents for generalized weakness, nausea, poor appetite  1.  Acute renal failure on chronic kidney disease stage IV vs ESRD Baseline creatinine 2.50, GFR 20 from May 2018 2.  Urinary tract infection 3.  Hypertension 4.  Anemia, of chronic kidney disease 5.  Acute pulmonary edema  -Patient's healthcare power of attorney has discussed the case with the patient's daughter.  They have decided against hemodialysis at this time which appears to be reasonable given the patient's comorbidities and overall health status.  They have decided upon hospice home as well.  Awaiting further palliative care input.  Otherwise recommend symptomatic treatment of nausea.  Further plan as patient progresses.     LOS: [place] 3/25/201912:07 PM  [place], [place]  Note: This note was prepared with Dragon dictation. Any transcription errors are unintentional

## 2017-09-22 NOTE — Progress Notes (Signed)
Pharmacy Antibiotic Note  [name] Holmes is a 83 y.o. female admitted on 09/17/2017 with CAP.  Pharmacy has been consulted for ceftriaxone dosing.  This is day #6 of antibiotics. Patient is also receiving azithromycin 500 mg PO daily.  Plan: Continue ceftriaxone 1 g IV Daily  Height: 5\' 4"  (162.6 cm) Weight: 133 lb 1.6 oz (60.4 kg) IBW/kg (Calculated) : 54.7  Temp (24hrs), Avg:96.6 F (35.9 C), Min:94.2 F (34.6 C), Max:99.5 F (37.5 C)  Recent Labs  Lab 09/17/17 1222 09/18/17 0350 09/19/17 0421 09/19/17 1521 09/20/17 0425 09/21/17 0412 09/22/17 0351  WBC 9.1 9.3  --  10.6 13.3*  --  9.4  CREATININE 9.33* 9.03* 8.39*  --  8.04* 7.70* 8.48*    Estimated Creatinine Clearance: 4.6 mL/min (A) (by C-G formula based on SCr of 8.48 mg/dL (H)).    No Known Allergies  Antimicrobials this admission:  Dose adjustments this admission:  Microbiology results: 3/22 UCx:  No results - sent 3/24 Blood: No growth < 24 hours  Thank you for allowing pharmacy to be a part of this patient's care.  Lenis Noon, Pharm.D., BCPS Clinical Pharmacist 09/22/2017 9:58 AM

## 2017-09-22 NOTE — Progress Notes (Signed)
Paged DR. Dew per POA will not be starting dialysis. Vascular returned call. Dr Lucky Cowboy in surgery. Message left that pt would not be undergoing planed perma catheter placement

## 2017-09-22 NOTE — Progress Notes (Signed)
Follow up visit/note on new hospice home referral. Patient seen lying in bed sleeping, did not awaken. Patient is an 83 year old woman with a known history of CKD, anemia of chronic disease, hypertension and alcohol abuse brought  to [facility]  for evaluation of increased weakness, she was found to be in acute on chromic renal failure. She has continued to decline. Attending physician Dr. Darvin Neighbours and Palliative Medicine NP Ihor Dow have spoken with patient and her family. Patient has declined dialysis, plan is to focus on comfort with transfer to the hospice home when bed available. She has been requiring IV medication for control of nausea, appetite poor, increased lethargy.  Writer spoke with patient's niece and HCPOA Carola Rhine via telephone to discuss hospice services. [name] will meet with hospice SW Jesse Sans at the hospice home to sign consents with plans for patient to transfer to the hospice home tomorrow. Signed DNR in place in hospital chart. Hospital care team updated. Patient information faxed to referral. Will continue to follow through final discharge. Flo Shanks RN, BSN, [place] and [facility] [place], hospital Liaison [phone]

## 2017-09-22 NOTE — Progress Notes (Signed)
New hospice home referral received from [place]. Mel Almond made aware there are currently no beds available. Writer to meet with family with plan to transfer to the hospice home tomorrow. Hospital care team aware. Flo Shanks RN, BSN, [place] and [facility] [place], hospital Liaison [phone]

## 2017-09-22 NOTE — Clinical Social Work Note (Signed)
Clinical Social Work Assessment  Patient Details  Name: [name] Holmes MRN: [id number] Date of Birth: 10-31-1934  Date of referral:  09/22/17               Reason for consult:  End of Life/Hospice                Permission sought to share information with:  Chartered certified accountant granted to share information::  Yes, Verbal Permission Granted  Name::        Agency::     Relationship::     Contact Information:     Housing/Transportation Living arrangements for the past 2 months:  [place] of Information:  Other (Comment Required)(Niece Arts development officer ) Patient Interpreter Needed:  None Criminal Activity/Legal Involvement Pertinent to Current Situation/Hospitalization:  No - Comment as needed Significant Relationships:  Other Family Members Lives with:  Adult Children Do you feel safe going back to the place where you live?    Need for family participation in patient care:  Yes (Comment)  Care giving concerns:  Patient lives at [address] [place], [place] with her adult daughter [name] Holmes.    Social Worker assessment / plan:  Holiday representative (CSW) received consult for residential hospice placement. Per chart patient's niece [name] Holmes is HPOA and making the decisions. CSW contacted [name] Holmes to discuss D/C plan. Per [name] Holmes patient lives in [place] with her daughter [name] Holmes. [name] Holmes reported that family has agreed for patient to go to the [facility] and be made comfort care. CSW made [facility] liaison is aware of above.   Per Santiago Glad patient will likely be able to D/C to the [facility] facility tomorrow.   Employment status:  Disabled (Comment on whether or not currently receiving Disability) Insurance information:  Managed Medicare PT Recommendations:  [place] / Referral to community resources:  Other (Comment Required)([facility] )  Patient/Family's Response  to care:  Patient's family has chosen [facility].   Patient/Family's Understanding of and Emotional Response to Diagnosis, Current Treatment, and Prognosis:  Niece [name] Holmes was very pleasant and thanked CSW for assistance. [name] Holmes understands that patient has a poor prognosis and will be made comfortable.   Emotional Assessment Appearance:  Appears stated age Attitude/Demeanor/Rapport:  Unable to Assess Affect (typically observed):  Unable to Assess Orientation:  Oriented to Self, Oriented to Place, Fluctuating Orientation (Suspected and/or reported Sundowners) Alcohol / Substance use:  Not Applicable Psych involvement (Current and /or in the community):  No (Comment)  Discharge Needs  Concerns to be addressed:  Discharge Planning Concerns Readmission within the last 30 days:  No Current discharge risk:  Dependent with Mobility, Cognitively Impaired, Chronically ill, Terminally ill Barriers to Discharge:  Continued Medical Work up   [employer], [name] Beets, LCSW 09/22/2017, 4:47 PM

## 2017-09-22 NOTE — Consult Note (Addendum)
Consultation Note Date: 09/22/2017   Patient Name: [name] Holmes  DOB: [birth year]  MRN: [id number]  Age / Sex: 83 y.o., female  PCP: Marguerita Merles, MD Referring Physician: Hillary Bow, MD  Reason for Consultation: Establishing goals of care and Hospice Evaluation  HPI/Patient Profile: 83 y.o. female  with past medical history of CKD, hypertension, gout, ETOH abuse, anemia, and arthritis admitted on 09/17/2017 with weakness. In ED fount to be in acute renal failure with underlying CKD. BUN/Creat 61/8.48 with uremia/anuria. Nephrology following and recommending hemodialysis. Also receiving antibiotics for RLL pneumonia. Palliative medicine consultation for goals of care.   Clinical Assessment and Goals of Care: I have reviewed medical records, discussed with care team, and met with patient and niece Carola Rhine) at bedside to discuss diagnosis, prognosis, GOC, EOL wishes, disposition and options.  Upon entering room, [name] on speaker phone with the patient's daughter and son who live out of state.   I introduced Palliative Medicine as specialized medical care for people living with serious illness. It focuses on providing relief from the symptoms and stress of a serious illness. The goal is to improve quality of life for both the patient and the family.  Prior to hospitalization, patient living home and fairly independent with assist from aids throughout the week, [name], and daughter [name]). [name] speaks of Ms. Gabor "being a mother" to her for many years.   Discussed hospital diagnoses and interventions. Son Domenic Schwab) is on speaker phone and urges his mother to "try" dialysis and "it doesn't hurt to try." [name] tells me she is HCPOA but remains important to her to have input from multiple family members so everyone is "on the same page."   I attempted to elicit values and goals of care  important to the patient. When [name] is off the phone, she speaks of the patient's son and sister being on dialysis. [name] clearly tells me that Ms. Mehra would rather "put her kidneys on a table" than start dialysis. She is sad thinking of losing her aunt but also "trying to do what's right" and honor her wishes. [name] confirms her wishes against resuscitation. Patient minimally participates in the conversation and remains drowsy and nauseous.   During my conversation, Dr Darvin Neighbours rounded and discussed with son and daughter recommendation for honoring the patient's wishes of not pursuing dialysis. Family members confirm understanding.   I further discussed the difference between aggressive medical intervention and comfort care in light of the patient's goals of care. Educated on comfort focused care with goal of comfort, quality, and dignity at EOL. Educated on medications as needed for symptom management.   Educated on hospice options and hospice philosophy. [facility] requesting hospice facility placement. [name] understands prognosis of likely two weeks or less with kidney failure, uremia, and anuria. [name] tells me she has also explained to Ms. [place] that time will be short, for which she understood and still spoke of her wishes against starting dialysis.   Questions and concerns were addressed. PMT contact information given.  SUMMARY OF RECOMMENDATIONS    After multiple discussions with family members, niece/HCPOA confirms the patient's wishes against starting hemodialysis.   Transition to comfort focused care.   Discussed hospice options.   Symptom management--see below.  Comfort feeds per patient/family request.   SW consult for residential hospice facility placement.   Code Status/Advance Care Planning:  DNR  Symptom Management:   Dilaudid 0.59m IV q2h prn pain/dyspnea/air hunger (will avoid Morphine with renal failure)  Ativan 1259mIV q4h prn anxiety  Haldol  59m57mV q6h prn agitation/nausea  Zofran 4mg75mh prn nausea/vomiting  Reglan 10mg40mq6h scheduled for nausea  Robinul 0.59mg I159m4h prn secretions  Palliative Prophylaxis:   Aspiration, Delirium Protocol, Frequent Pain Assessment, Oral Care and Turn Reposition  Additional Recommendations (Limitations, Scope, Preferences):  Full Comfort Care  Psycho-social/Spiritual:   Desire for further Chaplaincy support:yes  Additional Recommendations: Caregiving  Support/Resources and Education on Hospice  Prognosis:   < 2 weeks with acute renal failure superimposed on chronic kidney disease, uremia/anuria requiring hemodialysis. Patient and family decline hemodialysis per patient wishes.   Discharge Planning: Hospice facility      Primary Diagnoses: Present on Admission: . Acute renal failure (ARF) (HCC)  [facility] Universityhave reviewed the medical record, interviewed the patient and family, and examined the patient. The following aspects are pertinent.  Past Medical History:  Diagnosis Date  . Anemia of chronic renal failure   . Arthritis   . CKD (chronic kidney disease)   . ETOH abuse   . Gout   . Hypertension    Social History   Socioeconomic History  . Marital status: Divorced    Spouse name: Not on file  . Number of children: Not on file  . Years of education: Not on file  . Highest education level: Not on file  Occupational History  . Not on file  Social Needs  . Financial resource strain: Not on file  . Food insecurity:    Worry: Not on file    Inability: Not on file  . Transportation needs:    Medical: Not on file    Non-medical: Not on file  Tobacco Use  . Smoking status: Former Smoker    Types: Cigarettes  . Smokeless tobacco: Never Used  Substance and Sexual Activity  . Alcohol use: Yes    Alcohol/week: 7.2 oz    Types: 6 Cans of beer, 6 Shots of liquor per week    Comment: daily- beer and vodka  . Drug use: No  . Sexual activity: Never  Lifestyle  . Physical  activity:    Days per week: Not on file    Minutes per session: Not on file  . Stress: Not on file  Relationships  . Social connections:    Talks on phone: Not on file    Gets together: Not on file    Attends religious service: Not on file    Active member of club or organization: Not on file    Attends meetings of clubs or organizations: Not on file    Relationship status: Not on file  Other Topics Concern  . Not on file  Social History Narrative   Lives at home with family, ambulates with a walker   Family History  Problem Relation Age of Onset  . Hypertension Father   . Hypertension Mother    Scheduled Meds: . metoCLOPramide (REGLAN) injection  10 mg Intravenous Q6H   Continuous Infusions:  PRN Meds:.acetaminophen **OR** acetaminophen, haloperidol lactate,  HYDROmorphone (DILAUDID) injection, LORazepam **OR** LORazepam **OR** LORazepam, ondansetron **OR** ondansetron (ZOFRAN) IV Medications Prior to Admission:  Prior to Admission medications   Medication Sig Start Date End Date Taking? Authorizing Provider  acetaminophen (TYLENOL) 325 MG tablet Take 325-650 mg by mouth every 6 (six) hours as needed for mild pain, moderate pain or fever.   Yes [provider]  amLODipine (NORVASC) 10 MG tablet Take 1 tablet (10 mg total) by mouth daily. 11/16/16  Yes Fritzi Mandes, MD  Calcium Carbonate-Vitamin D3 (CALCIUM 600-D) 600-400 MG-UNIT TABS Take 2 tablets by mouth daily.   Yes [provider]  cholecalciferol (VITAMIN D) 1000 units tablet Take 1,000 Units by mouth daily.   Yes [provider]  docusate sodium (COLACE) 100 MG capsule Take 100 mg by mouth daily.   Yes [provider]  febuxostat (ULORIC) 40 MG tablet Take 40 mg by mouth daily.   Yes [provider]  feeding supplement, ENSURE ENLIVE, (ENSURE ENLIVE) LIQD Take 237 mLs by mouth 2 (two) times daily between meals. 11/16/15  Yes Mody, Ulice Bold, MD  ferrous sulfate 325 (65 FE) MG tablet  Take 650 mg by mouth daily with breakfast.   Yes [provider]  folic acid (FOLVITE) 1 MG tablet Take 1 mg by mouth daily.   Yes [provider]  levothyroxine (SYNTHROID, LEVOTHROID) 50 MCG tablet Take 50 mcg by mouth daily before breakfast.    Yes [provider]  magnesium oxide (MAG-OX) 400 MG tablet Take 400 mg by mouth daily.   Yes [provider]  metoprolol tartrate (LOPRESSOR) 50 MG tablet Take 50 mg by mouth 2 (two) times daily.   Yes [provider]  mirtazapine (REMERON) 15 MG tablet Take 15 mg by mouth at bedtime.   Yes [provider]  Multiple Vitamin (MULTIVITAMIN WITH MINERALS) TABS tablet Take 1 tablet by mouth daily.   Yes [provider]  oxybutynin (DITROPAN-XL) 5 MG 24 hr tablet Take 5 mg by mouth at bedtime.   Yes [provider]  pantoprazole (PROTONIX) 40 MG tablet Take 1 tablet (40 mg total) by mouth daily. 11/16/15  Yes Mody, Ulice Bold, MD  thiamine (VITAMIN B-1) 100 MG tablet Take 100 mg by mouth daily.   Yes [provider]  traZODone (DESYREL) 50 MG tablet Take 50 mg by mouth at bedtime.    Yes [provider]  metoprolol tartrate (LOPRESSOR) 25 MG tablet Take 1 tablet (25 mg total) by mouth 2 (two) times daily. Patient not taking: Reported on 09/17/2017 11/15/16   Fritzi Mandes, MD   No Known Allergies Review of Systems  Constitutional: Positive for activity change and appetite change.  Gastrointestinal: Positive for nausea.    Physical Exam  Constitutional: She is easily aroused. She appears ill.  HENT:  Head: Normocephalic and atraumatic.  Pulmonary/Chest: No accessory muscle usage. No tachypnea. No respiratory distress.  Neurological: She is easily aroused.  drowsy  Skin: Skin is warm and dry.  anasarca  Psychiatric: Her speech is delayed. She is inattentive.  Nursing note and vitals reviewed.  Vital Signs: BP (!) 159/110 (BP Location: Left Arm)   Pulse 69   Temp 97.8  F (36.6 C) (Oral)   Resp 16   Ht '5\' 4"'$  (1.626 m)   Wt 60.4 kg (133 lb 1.6 oz)   SpO2 100%   BMI 22.85 kg/m  Pain Scale: 0-10   Pain Score: 0-No pain  SpO2: SpO2: 100 % O2 Device:SpO2: 100 % O2  Flow Rate: .O2 Flow Rate (L/min): 2 L/min  IO: Intake/output summary:   Intake/Output Summary (Last 24 hours) at 09/22/2017 1547 Last data filed at 09/22/2017 1115 Gross per 24 hour  Intake 100 ml  Output -  Net 100 ml    LBM: Last BM Date: 09/22/17 Baseline Weight: Weight: 68 kg (150 lb) Most recent weight: Weight: 60.4 kg (133 lb 1.6 oz)     Palliative Assessment/Data: PPS 20%   Flowsheet Rows     Most Recent Value  Intake Tab  Referral Department  Hospitalist  Unit at Time of Referral  Med/Surg Unit  Palliative Care Primary Diagnosis  Nephrology  Palliative Care Type  New Palliative care  Reason for referral  Clarify Goals of Care  Date first seen by Palliative Care  09/22/17  Clinical Assessment  Palliative Performance Scale Score  20%  Psychosocial & Spiritual Assessment  Palliative Care Outcomes  Patient/Family meeting held?  Yes  Who was at the meeting?  patient, niece Education officer, community), and family members via telephone  Palliative Care Outcomes  Clarified goals of care, Counseled regarding hospice, Provided psychosocial or spiritual support, ACP counseling assistance, Changed to focus on comfort, Improved pain interventions, Improved non-pain symptom therapy, Provided end of life care assistance, Transitioned to hospice      Time In: 0930 Time Out: 1040 Time Total: 50mn Greater than 50%  of this time was spent counseling and coordinating care related to the above assessment and plan.  Signed by:  MIhor Dow FNP-C Palliative Medicine Team  Phone: [phone]: [phone]  Please contact Palliative Medicine Team phone at [phone] questions and concerns.  For individual provider: See AShea Evans

## 2017-09-23 NOTE — Discharge Summary (Signed)
[facility] NAME: [name] Holmes    MR#:  [id number]  DATE OF BIRTH:  03/24/1935  DATE OF ADMISSION:  09/17/2017 ADMITTING PHYSICIAN: Gladstone Lighter, MD  DATE OF DISCHARGE: No discharge date for patient encounter.  PRIMARY CARE PHYSICIAN: Marguerita Merles, MD   ADMISSION DIAGNOSIS:  Weakness [R53.1] Acute renal failure, unspecified acute renal failure type ([place]) [N17.9] Community acquired pneumonia of right lower lobe of lung ([place]) [J18.1] Anemia due to chronic kidney disease, unspecified CKD stage [N18.9, D63.1]  DISCHARGE DIAGNOSIS:  Active Problems:   Acute renal failure (ARF) ([place])   Community acquired pneumonia of right lower lobe of lung ([place])   Uremia   Nausea without vomiting   Palliative care by specialist   Terminal care   SECONDARY DIAGNOSIS:   Past Medical History:  Diagnosis Date  . Anemia of chronic renal failure   . Arthritis   . CKD (chronic kidney disease)   . ETOH abuse   . Gout   . Hypertension      ADMITTING HISTORY  HISTORY OF PRESENT ILLNESS:  [name] Holmes  is a 83 y.o. female with a known history of CKD, anemia of chronic disease, hypertension, alcohol abuse presents to hospital secondary to extreme weakness and noted to be in acute renal failure.  Patient is not a great historian. She was last seen in the nephrology clinic last year and her creatinine was 2.5 at the time. She does say that her appetite has been poor with increased GI symptoms, nausea and vomiting and a couple of days of diarrhea over the last week. She also has known history of anemia and required transfusion when she was in the hospital last year. EGD and colonoscopy did not reveal any significant GI sources and also had an outpatient capsule endoscopy. Seen by oncology as an outpatient for her anemia and was started on Procrit every 2 weeks but has not followed up at the clinic since then.Patient has been complaining of  significant weakness, dyspnea on exertion and er home health nurse called EMS today. Noted to be anemic again with a hemoglobin of 6 and noted to be in acute renal failure with creatinine of 9 today.     HOSPITAL COURSE:   *Acute kidney injury over CKD stage IV *Acute  metabolic encephalopathy due to uremia * UTI *Community-acquired pneumonia of right lower lobe *Acute hypoxic respiratory failure *Anemia of chronic disease *Mild troponin elevation due to pneumonia.  Not MI *Hypertension *Severe nausea due to uremia  Patient was admitted to medical floor with telemetry monitoring.  Her acute kidney injury was thought to be due to dehydration from recent diarrhea and poor oral intake.  Patient was started on IV fluids with mild improvement in renal function.  But slowly patient did not have any further urine output and renal functions did not improve.  Patient started having symptoms of uremia.  Dialysis was offered.  Patient has always told family that she would not want to be placed on hemodialysis.  After much discussions and involvement of palliative care decision has been made by family after talking to patient to not go ahead with hemodialysis.  At this time with uremic symptoms and end of life with life expectancy less than 2 weeks patient will be discharged to hospice home for end-of-life care.  CONSULTS OBTAINED:  Treatment Team:  Murlean Iba, MD  DRUG ALLERGIES:  No Known Allergies  DISCHARGE MEDICATIONS:   Allergies as  of 09/23/2017   No Known Allergies     Medication List    STOP taking these medications   acetaminophen 325 MG tablet Commonly known as:  TYLENOL   amLODipine 10 MG tablet Commonly known as:  NORVASC   CALCIUM 600-D 600-400 MG-UNIT Tabs Generic drug:  Calcium Carbonate-Vitamin D3   cholecalciferol 1000 units tablet Commonly known as:  VITAMIN D   docusate sodium 100 MG capsule Commonly known as:  COLACE   febuxostat 40 MG tablet Commonly  known as:  ULORIC   feeding supplement (ENSURE ENLIVE) Liqd   ferrous sulfate 325 (65 FE) MG tablet   folic acid 1 MG tablet Commonly known as:  FOLVITE   levothyroxine 50 MCG tablet Commonly known as:  SYNTHROID, LEVOTHROID   magnesium oxide 400 MG tablet Commonly known as:  MAG-OX   metoprolol tartrate 25 MG tablet Commonly known as:  LOPRESSOR   metoprolol tartrate 50 MG tablet Commonly known as:  LOPRESSOR   mirtazapine 15 MG tablet Commonly known as:  REMERON   multivitamin with minerals Tabs tablet   oxybutynin 5 MG 24 hr tablet Commonly known as:  DITROPAN-XL   pantoprazole 40 MG tablet Commonly known as:  PROTONIX   thiamine 100 MG tablet Commonly known as:  VITAMIN B-1   traZODone 50 MG tablet Commonly known as:  DESYREL       Today   VITAL SIGNS:  Blood pressure (!) 155/65, pulse 78, temperature 99.6 F (37.6 C), temperature source Oral, resp. rate 16, height 5\' 4"  (1.626 m), weight 60.4 kg (133 lb 1.6 oz), SpO2 98 %.  I/O:    Intake/Output Summary (Last 24 hours) at 09/23/2017 0659 Last data filed at 09/22/2017 1900 Gross per 24 hour  Intake 480 ml  Output -  Net 480 ml    PHYSICAL EXAMINATION:  Physical Exam  GENERAL:  83 y.o.-year-old patient lying in the bed LUNGS: Basal crackles CARDIOVASCULAR: S1, S2  PSYCHIATRIC: The patient is drowsy  DATA REVIEW:   CBC Recent Labs  Lab 09/22/17 0351  WBC 9.4  HGB 8.2*  HCT 25.0*  PLT 202    Chemistries  Recent Labs  Lab 09/17/17 1222  09/22/17 0351  NA 137   < > 130*  K 4.2   < > 5.0  CL 108   < > 104  CO2 18*   < > 17*  GLUCOSE 88   < > 79  BUN 68*   < > 61*  CREATININE 9.33*   < > 8.48*  CALCIUM 7.3*   < > 7.8*  AST 18  --   --   ALT 7*  --   --   ALKPHOS 62  --   --   BILITOT 0.4  --   --    < > = values in this interval not displayed.    Cardiac Enzymes Recent Labs  Lab 09/18/17 2338  TROPONINI 0.03*    Microbiology Results  Results for orders placed or  performed during the hospital encounter of 09/17/17  CULTURE, BLOOD (ROUTINE X 2) w Reflex to ID Panel     Status: None (Preliminary result)   Collection Time: 09/21/17  3:01 PM  Result Value Ref Range Status   Specimen Description BLOOD RT [facility]  Final   Special Requests   Final    BOTTLES DRAWN AEROBIC AND ANAEROBIC Blood Culture results may not be optimal due to an excessive volume of blood received in culture bottles   Culture  Final    NO GROWTH < 24 HOURS Performed at [facility], [address] [place], [place] [address]    Report Status PENDING  Incomplete  CULTURE, BLOOD (ROUTINE X 2) w Reflex to ID Panel     Status: None (Preliminary result)   Collection Time: 09/21/17  3:12 PM  Result Value Ref Range Status   Specimen Description BLOOD LT [facility]  Final   Special Requests   Final    BOTTLES DRAWN AEROBIC AND ANAEROBIC Blood Culture results may not be optimal due to an excessive volume of blood received in culture bottles   Culture   Final    NO GROWTH < 24 HOURS Performed at [facility], [address] [place],  [address]    Report Status PENDING  Incomplete    RADIOLOGY:  No results found.  Follow up with PCP in 1 week.  Management plans discussed with the patient, family and they are in agreement.  CODE STATUS:     Code Status Orders  (From admission, onward)        Start     Ordered   09/22/17 1319  Do not attempt resuscitation (DNR)  Continuous    Question Answer Comment  In the event of cardiac or respiratory ARREST Do not call a "code blue"   In the event of cardiac or respiratory ARREST Do not perform Intubation, CPR, defibrillation or ACLS   In the event of cardiac or respiratory ARREST Use medication by any route, position, wound care, and other measures to relive pain and suffering. May use oxygen, suction and manual treatment of airway obstruction as needed for comfort.      09/22/17 1319    Code Status History    Date  Active Date Inactive Code Status Order ID Comments User Context   09/21/2017 1413 09/22/2017 1319 DNR [id number]  Hillary Bow, MD Inpatient   09/17/2017 1541 09/21/2017 1413 Full Code [id number]  Gladstone Lighter, MD Inpatient   11/14/2016 2056 11/15/2016 1947 Partial Code [id number]  Vaughan Basta, MD Inpatient   11/14/2015 2012 11/16/2015 1959 Full Code [id number]  Vaughan Basta, MD Inpatient   10/14/2015 0848 10/16/2015 2033 Full Code [id number]  [name], MD Inpatient   08/24/2015 2318 08/28/2015 1938 Full Code [id number]  Hillary Bow, MD ED   12/05/2014 2051 12/08/2014 2213 Full Code [id number]  Dustin Flock, MD Inpatient   11/03/2014 0943 11/08/2014 2346 Full Code [id number]  Harrie Foreman, MD Inpatient    Advance Directive Documentation     Most Recent Value  Type of Advance Directive  Healthcare Power of Attorney  Pre-existing out of facility DNR order (yellow form or pink MOST form)  -  "MOST" Form in Place?  -      TOTAL TIME TAKING CARE OF THIS PATIENT ON DAY OF DISCHARGE: more than 30 minutes.   Leia Alf Daliana Leverett M.D on 09/23/2017 at 6:59 AM  Between 7am to 6pm - Pager - [phone]  After 6pm go to [email or web address] - password [email or web address] [facility]  [facility]  Office  [phone]  CC: Primary care physician; Marguerita Merles, MD  Note: This dictation was prepared with Dragon dictation along with smaller phrase technology. Any transcriptional errors that result from this process are unintentional.

## 2017-09-23 NOTE — Progress Notes (Signed)
Per [facility] liaison patient will transfer to the hospice facility today. Clinical Education officer, museum (CSW) prepared EMS form including DNR. MD aware of above. Please reconsult if future social work needs arise. CSW signing off.   [employer], LCSW [phone]

## 2017-09-23 NOTE — Progress Notes (Signed)
Healthcare POA, guardian notified in person of pending discharge of pt to hospice care. POA called @ [phone] to notify that EMS has picked up pt for transport. No answer was received; unable to leave message due to full in box. Will continue to attempt contact.

## 2017-09-23 NOTE — Progress Notes (Signed)
Carola Rhine notified of transport. Called [phone]

## 2017-09-23 NOTE — Progress Notes (Signed)
Follow up visit made to new hospice home referral. Patient seen lying in bed, alert, niece [name] at bedside. Patient complained of nausea, staff RN Velna Hatchet made aware and scheduled [place] given. Patient is not eating, did try an ice chip during visit and coughed some afterwards. Report called to the [facility], EMS notified for a 4 PM pick up. Hospital care team and family aware. Flo Shanks RN, BSN, [facility] [place], hospital Liaison [phone] '

## 2017-09-23 NOTE — Progress Notes (Signed)
Pt released in care of EMS transport to Hospice house. Report provided to EMS attendants. No remaining questions at time of transfer. Pt. Information packet given to EMS to transport with pt.

## 2017-09-26 LAB — CULTURE, BLOOD (ROUTINE X 2)
CULTURE: NO GROWTH
Culture: NO GROWTH

## 2017-10-29 DEATH — deceased

## 2019-08-28 ENCOUNTER — Other Ambulatory Visit: Payer: Self-pay | Admitting: Nurse Practitioner

## 2020-07-11 ENCOUNTER — Other Ambulatory Visit: Payer: Self-pay | Admitting: Neurosurgery

## 2020-07-11 ENCOUNTER — Other Ambulatory Visit: Payer: Self-pay | Admitting: *Deleted

## 2020-07-11 DIAGNOSIS — S065X9A Traumatic subdural hemorrhage with loss of consciousness of unspecified duration, initial encounter: Secondary | ICD-10-CM

## 2020-07-11 DIAGNOSIS — S065XAA Traumatic subdural hemorrhage with loss of consciousness status unknown, initial encounter: Secondary | ICD-10-CM
